# Patient Record
Sex: Female | Born: 1980 | Race: Black or African American | Hispanic: No | Marital: Married | Smoking: Former smoker
Health system: Southern US, Community
[De-identification: ages and names within clinical notes are randomized; demographics above are authoritative.]

## PROBLEM LIST (undated history)

## (undated) DIAGNOSIS — G4730 Sleep apnea, unspecified: Secondary | ICD-10-CM

## (undated) DIAGNOSIS — K219 Gastro-esophageal reflux disease without esophagitis: Secondary | ICD-10-CM

## (undated) DIAGNOSIS — J45909 Unspecified asthma, uncomplicated: Secondary | ICD-10-CM

## (undated) DIAGNOSIS — E119 Type 2 diabetes mellitus without complications: Secondary | ICD-10-CM

## (undated) DIAGNOSIS — I639 Cerebral infarction, unspecified: Secondary | ICD-10-CM

## (undated) DIAGNOSIS — I10 Essential (primary) hypertension: Secondary | ICD-10-CM

## (undated) HISTORY — PX: TUBAL LIGATION: SHX77

## (undated) HISTORY — DX: Cerebral infarction, unspecified: I63.9

---

## 2012-11-17 ENCOUNTER — Emergency Department (HOSPITAL_BASED_OUTPATIENT_CLINIC_OR_DEPARTMENT_OTHER)
Admission: EM | Admit: 2012-11-17 | Discharge: 2012-11-17 | Disposition: A | Discharge status: Home / Self Care | Payer: Medicaid Other | Attending: Emergency Medicine | Admitting: Emergency Medicine

## 2012-11-17 ENCOUNTER — Encounter (HOSPITAL_BASED_OUTPATIENT_CLINIC_OR_DEPARTMENT_OTHER): Payer: Self-pay | Admitting: Emergency Medicine

## 2012-11-17 DIAGNOSIS — F17200 Nicotine dependence, unspecified, uncomplicated: Secondary | ICD-10-CM | POA: Insufficient documentation

## 2012-11-17 DIAGNOSIS — R51 Headache: Secondary | ICD-10-CM | POA: Insufficient documentation

## 2012-11-17 DIAGNOSIS — J45909 Unspecified asthma, uncomplicated: Secondary | ICD-10-CM | POA: Insufficient documentation

## 2012-11-17 DIAGNOSIS — I10 Essential (primary) hypertension: Secondary | ICD-10-CM | POA: Insufficient documentation

## 2012-11-17 DIAGNOSIS — Z79899 Other long term (current) drug therapy: Secondary | ICD-10-CM | POA: Insufficient documentation

## 2012-11-17 HISTORY — DX: Unspecified asthma, uncomplicated: J45.909

## 2012-11-17 HISTORY — DX: Essential (primary) hypertension: I10

## 2012-11-17 MED ORDER — IBUPROFEN 800 MG PO TABS
800.0000 mg | ORAL_TABLET | Freq: Once | ORAL | Status: AC
  Administered 2012-11-17: 800 mg via ORAL
  Filled 2012-11-17: qty 1

## 2012-11-17 MED ORDER — HYDROCHLOROTHIAZIDE 25 MG PO TABS
12.5000 mg | ORAL_TABLET | Freq: Once | ORAL | Status: AC
  Administered 2012-11-17: 12.5 mg via ORAL
  Filled 2012-11-17: qty 1

## 2012-11-17 MED ORDER — LISINOPRIL 10 MG PO TABS
40.0000 mg | ORAL_TABLET | Freq: Once | ORAL | Status: AC
  Administered 2012-11-17: 40 mg via ORAL
  Filled 2012-11-17: qty 4

## 2012-11-17 NOTE — ED Notes (Signed)
MD at bedside. 

## 2012-11-17 NOTE — ED Notes (Signed)
C/o headache since this AM, took tylenol and had some relief but now headache has returned. Pt c/o nausea, denies blurred vision.

## 2012-11-17 NOTE — ED Notes (Signed)
Pt states she is currently out of her BP meds

## 2012-11-17 NOTE — ED Provider Notes (Signed)
CSN: [id number]     Arrival date & time 11/17/12  2108 History  This chart was scribed for Christina [name] B. Bernette Mayers, MD by Karle Plumber, ED Scribe. This patient was seen in room MH02/MH02 and the patient's care was started at 9:30 PM.     Chief Complaint  Patient presents with  . Headache   The history is provided by the patient. No language interpreter was used.   HPI Comments:  Christina [name] is a 32 y.o. female with h/o HTN who presents to the Emergency Department complaining of severe head pain onset approximately 7 hours ago. She reports associated tingling in her face and associated nausea with one episode of emesis. She states she took OTC Tylenol with moderate relief but the pain returned at approximately 7 PM. She states she has experienced this type of headache before when her blood pressure is high. She states she has not taken her blood pressure medication in three days. She reports having preeclampsia with her last pregnancy 5 years ago. She denies any blurred vision.  Past Medical History  Diagnosis Date  . Hypertension   . Asthma    Past Surgical History  Procedure Laterality Date  . Tubal ligation     History reviewed. No pertinent family history. History  Substance Use Topics  . Smoking status: Current Every Day Smoker  . Smokeless tobacco: Not on file  . Alcohol Use: Yes   OB History   Grav Para Term Preterm Abortions TAB SAB Ect Mult Living                 Review of Systems A complete 10 system review of systems was obtained and all systems are negative except as noted in the HPI and PMH.   Allergies  Review of patient's allergies indicates no known allergies.  Home Medications   Current Outpatient Rx  Name  Route  Sig  Dispense  Refill  . albuterol (PROVENTIL HFA;VENTOLIN HFA) 108 (90 BASE) MCG/ACT inhaler   Inhalation   Inhale 2 puffs into the lungs every 6 (six) hours as needed for wheezing.         . hydrochlorothiazide (HYDRODIURIL) 12.5 MG  tablet   Oral   Take 12.5 mg by mouth daily.         Marland Kitchen lisinopril (PRINIVIL,ZESTRIL) 40 MG tablet   Oral   Take 40 mg by mouth daily.         Marland Kitchen loratadine (CLARITIN) 10 MG tablet   Oral   Take 10 mg by mouth daily.         . metoprolol succinate (TOPROL-XL) 100 MG 24 hr tablet   Oral   Take 100 mg by mouth daily. Take with or immediately following a meal.          Triage Vitals: BP 176/109  Pulse 75  Temp(Src) 98.4 F (36.9 C) (Oral)  Resp 18  Ht 5\' 9"  (1.753 m)  Wt 287 lb (130.182 kg)  BMI 42.36 kg/m2  SpO2 100% Physical Exam  Nursing note and vitals reviewed. Constitutional: She is oriented to person, place, and time. She appears well-developed and well-nourished.  HENT:  Head: Normocephalic and atraumatic.  Eyes: EOM are normal. Pupils are equal, round, and reactive to light.  Neck: Normal range of motion. Neck supple.  Cardiovascular: Normal rate, normal heart sounds and intact distal pulses.   Pulmonary/Chest: Effort normal and breath sounds normal.  Abdominal: Bowel sounds are normal. She exhibits no distension. There is no tenderness.  Musculoskeletal: Normal range of motion. She exhibits no edema and no tenderness.  Neurological: She is alert and oriented to person, place, and time. She has normal strength. No cranial nerve deficit or sensory deficit.  Skin: Skin is warm and dry. No rash noted.  Psychiatric: She has a normal mood and affect. Her behavior is normal.    ED Course  Procedures (including critical care time) DIAGNOSTIC STUDIES: Oxygen Saturation is 100% on RA, normal by my interpretation.   COORDINATION OF CARE: 9:35 PM- Will give the pt a dose of hydrochlorothiazide, lisinopril, and metoprolol. Pt verbalizes understanding and agrees to plan.  Labs Review Labs Reviewed - No data to display Imaging Review No results found.  EKG Interpretation   None       MDM  No diagnosis found.  Pt out of HTN meds for several days, will get  them filled tomorrow has headache consistent with previous episodes of HTN. No concern for acute end organ damage/hypertensive emergency. Given an oral dose of her missed home meds. Motrin for headache.   I personally performed the services described in this documentation, which was scribed in my presence. The recorded information has been reviewed and is accurate.        Zayneb Baucum B. Bernette Mayers, MD 11/17/12 2233

## 2012-12-26 ENCOUNTER — Encounter (HOSPITAL_BASED_OUTPATIENT_CLINIC_OR_DEPARTMENT_OTHER): Payer: Self-pay | Admitting: Emergency Medicine

## 2012-12-26 ENCOUNTER — Emergency Department (HOSPITAL_BASED_OUTPATIENT_CLINIC_OR_DEPARTMENT_OTHER)
Admission: EM | Admit: 2012-12-26 | Discharge: 2012-12-26 | Disposition: A | Discharge status: Home / Self Care | Payer: Medicaid Other | Attending: Emergency Medicine | Admitting: Emergency Medicine

## 2012-12-26 DIAGNOSIS — J45909 Unspecified asthma, uncomplicated: Secondary | ICD-10-CM | POA: Insufficient documentation

## 2012-12-26 DIAGNOSIS — R51 Headache: Secondary | ICD-10-CM | POA: Insufficient documentation

## 2012-12-26 DIAGNOSIS — F17200 Nicotine dependence, unspecified, uncomplicated: Secondary | ICD-10-CM | POA: Insufficient documentation

## 2012-12-26 DIAGNOSIS — I10 Essential (primary) hypertension: Secondary | ICD-10-CM | POA: Insufficient documentation

## 2012-12-26 DIAGNOSIS — Z79899 Other long term (current) drug therapy: Secondary | ICD-10-CM | POA: Insufficient documentation

## 2012-12-26 DIAGNOSIS — R209 Unspecified disturbances of skin sensation: Secondary | ICD-10-CM | POA: Insufficient documentation

## 2012-12-26 MED ORDER — LISINOPRIL 20 MG PO TABS: 40.0000 mg | ORAL_TABLET | Freq: Every day | ORAL | Status: DC

## 2012-12-26 MED ORDER — LORATADINE 10 MG PO TABS: 10.0000 mg | ORAL_TABLET | Freq: Every day | ORAL | Status: DC

## 2012-12-26 MED ORDER — HYDROCHLOROTHIAZIDE 12.5 MG PO CAPS: 12.5000 mg | ORAL_CAPSULE | Freq: Every day | ORAL | Status: DC

## 2012-12-26 MED ORDER — METOPROLOL SUCCINATE ER 25 MG PO TB24: 100.0000 mg | ORAL_TABLET | Freq: Every day | ORAL | Status: DC

## 2012-12-26 NOTE — ED Notes (Signed)
Patient here requesting medication refill for BP meds. Patient reports that the pharmacy will no longer refill her meds until she sees MD in [place]. Patient reports that she has missed her BP medication x 2 days. States she gets headaches, has lower extremity swelling, and feels bad without it.

## 2012-12-26 NOTE — ED Provider Notes (Signed)
CSN: [id number]     Arrival date & time 12/26/12  1017 History   First MD Initiated Contact with Patient 12/26/12 1039     Chief Complaint  Patient presents with  . Hypertension   (Consider location/radiation/quality/duration/timing/severity/associated sxs/prior Treatment) HPI Comments: Patient presents for a refill on her blood pressure medications. She states that she normally takes several blood pressure medications and she's been out for about the last 3 days. She says that she normally gets a headache and tingling in her hands when her blood pressure goes up and she is feeling that currently. She says that she has appointment with her primary care physician in [facility] in December but needs medication to last her until that time. She denies any chest pain or shortness of breath. She denies any numbness or weakness in arms or legs other than occasional tingling in her hands. She denies any gait disturbances. She denies any facial numbness or speech deficits.  Patient is a 32 y.o. female presenting with hypertension.  Hypertension Associated symptoms include headaches. Pertinent negatives include no chest pain, no abdominal pain and no shortness of breath.    Past Medical History  Diagnosis Date  . Hypertension   . Asthma    Past Surgical History  Procedure Laterality Date  . Tubal ligation     No family history on file. History  Substance Use Topics  . Smoking status: Current Every Day Smoker  . Smokeless tobacco: Not on file  . Alcohol Use: Yes   OB History   Grav Para Term Preterm Abortions TAB SAB Ect Mult Living                 Review of Systems  Constitutional: Negative for fever, chills, diaphoresis and fatigue.  HENT: Negative for congestion, rhinorrhea and sneezing.   Eyes: Negative.   Respiratory: Negative for cough, chest tightness and shortness of breath.   Cardiovascular: Negative for chest pain and leg swelling.  Gastrointestinal: Negative for nausea,  vomiting, abdominal pain, diarrhea and blood in stool.  Genitourinary: Negative for frequency, hematuria, flank pain and difficulty urinating.  Musculoskeletal: Negative for arthralgias and back pain.  Skin: Negative for rash.  Neurological: Positive for headaches. Negative for dizziness, speech difficulty, weakness and numbness.    Allergies  Review of patient's allergies indicates no known allergies.  Home Medications   Current Outpatient Rx  Name  Route  Sig  Dispense  Refill  . cholecalciferol (VITAMIN D) 1000 UNITS tablet   Oral   Take 1,000 Units by mouth once a week.         Marland Kitchen albuterol (PROVENTIL HFA;VENTOLIN HFA) 108 (90 BASE) MCG/ACT inhaler   Inhalation   Inhale 2 puffs into the lungs every 6 (six) hours as needed for wheezing.         . hydrochlorothiazide (HYDRODIURIL) 12.5 MG tablet   Oral   Take 12.5 mg by mouth daily.         . hydrochlorothiazide (MICROZIDE) 12.5 MG capsule   Oral   Take 1 capsule (12.5 mg total) by mouth daily.   30 capsule   0   . lisinopril (PRINIVIL,ZESTRIL) 20 MG tablet   Oral   Take 2 tablets (40 mg total) by mouth daily.   60 tablet   0   . lisinopril (PRINIVIL,ZESTRIL) 40 MG tablet   Oral   Take 40 mg by mouth daily.         Marland Kitchen loratadine (CLARITIN) 10 MG tablet  Oral   Take 10 mg by mouth daily.         Marland Kitchen loratadine (CLARITIN) 10 MG tablet   Oral   Take 1 tablet (10 mg total) by mouth daily.   30 tablet   0   . metoprolol succinate (TOPROL-XL) 100 MG 24 hr tablet   Oral   Take 100 mg by mouth daily. Take with or immediately following a meal.         . metoprolol succinate (TOPROL-XL) 25 MG 24 hr tablet   Oral   Take 4 tablets (100 mg total) by mouth daily.   120 tablet   0    BP 145/97  Pulse 78  Temp(Src) 98.2 F (36.8 C) (Oral)  Resp 16  Ht 5\' 9"  (1.753 m)  Wt 280 lb (127.007 kg)  BMI 41.33 kg/m2  SpO2 100% Physical Exam  Constitutional: She is oriented to person, place, and time. She  appears well-developed and well-nourished.  HENT:  Head: Normocephalic and atraumatic.  Eyes: Pupils are equal, round, and reactive to light.  Neck: Normal range of motion. Neck supple.  Cardiovascular: Normal rate, regular rhythm and normal heart sounds.   Pulmonary/Chest: Effort normal and breath sounds normal. No respiratory distress. She has no wheezes. She has no rales. She exhibits no tenderness.  Abdominal: Soft. Bowel sounds are normal. There is no tenderness. There is no rebound and no guarding.  Musculoskeletal: Normal range of motion. She exhibits no edema.  Motor 5 out of 5 all extremities. Sensation grossly intact to light touch all extremities. Finger to nose intact. No pronator drift. No facial drooping or aphasia. Gait normal.  Lymphadenopathy:    She has no cervical adenopathy.  Neurological: She is alert and oriented to person, place, and time.  Skin: Skin is warm and dry. No rash noted.  Psychiatric: She has a normal mood and affect.    ED Course  Procedures (including critical care time) Labs Review Labs Reviewed - No data to display Imaging Review No results found.  EKG Interpretation   None       MDM   1. Hypertension    Patient was given a refill on her medications and encouraged to followup with her primary care physician as scheduled in December.    Rolan Bucco, MD 12/26/12 [phone]

## 2013-07-06 ENCOUNTER — Encounter (HOSPITAL_BASED_OUTPATIENT_CLINIC_OR_DEPARTMENT_OTHER): Payer: Self-pay | Admitting: Emergency Medicine

## 2013-07-06 ENCOUNTER — Emergency Department (HOSPITAL_BASED_OUTPATIENT_CLINIC_OR_DEPARTMENT_OTHER)
Admission: EM | Admit: 2013-07-06 | Discharge: 2013-07-06 | Disposition: A | Discharge status: Home / Self Care | Payer: Medicaid Other | Attending: Emergency Medicine | Admitting: Emergency Medicine

## 2013-07-06 DIAGNOSIS — Z79899 Other long term (current) drug therapy: Secondary | ICD-10-CM | POA: Insufficient documentation

## 2013-07-06 DIAGNOSIS — L02411 Cutaneous abscess of right axilla: Secondary | ICD-10-CM

## 2013-07-06 DIAGNOSIS — J45909 Unspecified asthma, uncomplicated: Secondary | ICD-10-CM | POA: Insufficient documentation

## 2013-07-06 DIAGNOSIS — F17200 Nicotine dependence, unspecified, uncomplicated: Secondary | ICD-10-CM | POA: Insufficient documentation

## 2013-07-06 DIAGNOSIS — IMO0002 Reserved for concepts with insufficient information to code with codable children: Secondary | ICD-10-CM | POA: Insufficient documentation

## 2013-07-06 DIAGNOSIS — Z8614 Personal history of Methicillin resistant Staphylococcus aureus infection: Secondary | ICD-10-CM | POA: Insufficient documentation

## 2013-07-06 DIAGNOSIS — I10 Essential (primary) hypertension: Secondary | ICD-10-CM | POA: Insufficient documentation

## 2013-07-06 MED ORDER — PENTAFLUOROPROP-TETRAFLUOROETH EX AERO
INHALATION_SPRAY | CUTANEOUS | Status: AC
  Administered 2013-07-06: 13:00:00
  Filled 2013-07-06: qty 103.5

## 2013-07-06 MED ORDER — SULFAMETHOXAZOLE-TMP DS 800-160 MG PO TABS
1.0000 | ORAL_TABLET | Freq: Once | ORAL | Status: AC
  Administered 2013-07-06: 1 via ORAL
  Filled 2013-07-06: qty 1

## 2013-07-06 MED ORDER — HYDROCODONE-ACETAMINOPHEN 5-325 MG PO TABS: 2.0000 | ORAL_TABLET | Freq: Four times a day (QID) | ORAL | Status: DC | PRN

## 2013-07-06 MED ORDER — SULFAMETHOXAZOLE-TRIMETHOPRIM 800-160 MG PO TABS: 1.0000 | ORAL_TABLET | Freq: Two times a day (BID) | ORAL | Status: DC

## 2013-07-06 NOTE — ED Notes (Signed)
MD at bedside. 

## 2013-07-06 NOTE — ED Notes (Signed)
Pt has an abscess in her right axilla x4 days.

## 2013-07-06 NOTE — ED Provider Notes (Signed)
CSN: [id number]     Arrival date & time 07/06/13  1041 History   First MD Initiated Contact with Patient 07/06/13 1054     Chief Complaint  Patient presents with  . Abscess     (Consider location/radiation/quality/duration/timing/severity/associated sxs/prior Treatment) The history is provided by the patient.  Christina [name] is a 33 y.o. female HTN here with abscess in R axilla. She has frequent axillary abscesses and had them drained before. She is trying to get surgery but she has medicaid so needs pre approval. For the last 4 days, noticed R axillary swelling and pain. No drainage. No fevers. 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    Past Medical History  Diagnosis Date  . Hypertension   . Asthma    Past Surgical History  Procedure Laterality Date  . Tubal ligation     No family history on file. History  Substance Use Topics  . Smoking status: Current Every Day Smoker  . Smokeless tobacco: Not on file  . Alcohol Use: Yes   OB History   Grav Para Term Preterm Abortions TAB SAB Ect Mult Living                 Review of Systems  Skin: Positive for wound.  All other systems reviewed and are negative.     Allergies  Review of patient's allergies indicates no known allergies.  Home Medications   Prior to Admission medications   Medication Sig Start Date End Date Taking? Authorizing Provider  albuterol (PROVENTIL HFA;VENTOLIN HFA) 108 (90 BASE) MCG/ACT inhaler Inhale 2 puffs into the lungs every 6 (six) hours as needed for wheezing.    Historical Provider, MD  cholecalciferol (VITAMIN D) 1000 UNITS tablet Take 1,000 Units by mouth once a week.    Historical Provider, MD  hydrochlorothiazide (HYDRODIURIL) 12.5 MG tablet Take 12.5 mg by mouth daily.    Historical Provider, MD  hydrochlorothiazide (MICROZIDE) 12.5 MG capsule Take 1 capsule (12.5 mg total) by mouth daily. 12/26/12   Rolan Bucco, MD  lisinopril (PRINIVIL,ZESTRIL) 20 MG tablet Take 2 tablets  (40 mg total) by mouth daily. 12/26/12   Rolan Bucco, MD  lisinopril (PRINIVIL,ZESTRIL) 40 MG tablet Take 40 mg by mouth daily.    Historical Provider, MD  loratadine (CLARITIN) 10 MG tablet Take 10 mg by mouth daily.    Historical Provider, MD  loratadine (CLARITIN) 10 MG tablet Take 1 tablet (10 mg total) by mouth daily. 12/26/12   Rolan Bucco, MD  metoprolol succinate (TOPROL-XL) 100 MG 24 hr tablet Take 100 mg by mouth daily. Take with or immediately following a meal.    Historical Provider, MD  metoprolol succinate (TOPROL-XL) 25 MG 24 hr tablet Take 4 tablets (100 mg total) by mouth daily. 12/26/12   Rolan Bucco, MD   BP 131/78  Pulse 76  Temp(Src) 97.9 F (36.6 C) (Oral)  Resp 18  Ht 5\' 9"  (1.753 m)  Wt 285 lb (129.275 kg)  BMI 42.07 kg/m2  SpO2 100%  LMP 07/01/2013 Physical Exam  Nursing note and vitals reviewed. Constitutional: She is oriented to person, place, and time. She appears well-developed.  HENT:  Head: Normocephalic.  Mouth/Throat: Oropharynx is clear and moist.  Eyes: Pupils are equal, round, and reactive to light.  Neck: Normal range of motion. Neck supple.  Cardiovascular: Normal rate.   Pulmonary/Chest: Effort normal.  Abdominal: Soft.  Musculoskeletal: Normal range of motion.  Neurological: She is alert and oriented to person, place,  and time.  Skin: Skin is warm and dry.  R axilla with erythema and fluctuance around 2 in diameter   Psychiatric: She has a normal mood and affect. Her behavior is normal. Judgment and thought content normal.    ED Course  Procedures (including critical care time) INCISION AND DRAINAGE Performed by: Richardean Canalavid H Brant Peets Consent: Verbal consent obtained. Risks and benefits: risks, benefits and alternatives were discussed Type: abscess  Body area: R axilla  Anesthesia: local infiltration and gebauers aerosol  Incision was made with a scalpel.  Local anesthetic: lidocaine 2 % no epinephrine  Anesthetic total: 2  ml  Complexity: complex Blunt dissection to break up loculations  Drainage: purulent  Drainage amount: copious  Packing material: none  Patient tolerance: Patient tolerated the procedure well with no immediate complications.     Labs Review Labs Reviewed - No data to display  Imaging Review No results found.   EKG Interpretation None      MDM   Final diagnoses:  None   Christina [name] is a 33 y.o. female here with R arm abscess. I offered abx and f/u vs I and D since it will likely recur. She wants I and D since it will help relieve the pressure. States that she usually get bactrim so will d/c home with same. She will try and get in to see the surgeon for definitive treatment.      Richardean Canalavid H Thelma Viana, MD 07/06/13 [phone]

## 2013-07-06 NOTE — Discharge Instructions (Signed)
Take bactrim for a week.   Keep wound clean and dry. It may drain so change dressing if it gets soaked.   Follow up with your doctor in a week for wound check.  You should talk to your doctor about getting the surgery.   Return to ER if you have fever, worse swelling.

## 2013-09-12 ENCOUNTER — Encounter (HOSPITAL_BASED_OUTPATIENT_CLINIC_OR_DEPARTMENT_OTHER): Payer: Self-pay | Admitting: Emergency Medicine

## 2013-09-12 ENCOUNTER — Emergency Department (HOSPITAL_BASED_OUTPATIENT_CLINIC_OR_DEPARTMENT_OTHER)
Admission: EM | Admit: 2013-09-12 | Discharge: 2013-09-12 | Disposition: A | Discharge status: Home / Self Care | Payer: Medicaid Other | Attending: Emergency Medicine | Admitting: Emergency Medicine

## 2013-09-12 DIAGNOSIS — Y9389 Activity, other specified: Secondary | ICD-10-CM | POA: Insufficient documentation

## 2013-09-12 DIAGNOSIS — E669 Obesity, unspecified: Secondary | ICD-10-CM | POA: Insufficient documentation

## 2013-09-12 DIAGNOSIS — Z79899 Other long term (current) drug therapy: Secondary | ICD-10-CM | POA: Insufficient documentation

## 2013-09-12 DIAGNOSIS — I10 Essential (primary) hypertension: Secondary | ICD-10-CM | POA: Insufficient documentation

## 2013-09-12 DIAGNOSIS — Z792 Long term (current) use of antibiotics: Secondary | ICD-10-CM | POA: Insufficient documentation

## 2013-09-12 DIAGNOSIS — T628X1A Toxic effect of other specified noxious substances eaten as food, accidental (unintentional), initial encounter: Secondary | ICD-10-CM | POA: Insufficient documentation

## 2013-09-12 DIAGNOSIS — J45909 Unspecified asthma, uncomplicated: Secondary | ICD-10-CM | POA: Insufficient documentation

## 2013-09-12 DIAGNOSIS — Y9229 Other specified public building as the place of occurrence of the external cause: Secondary | ICD-10-CM | POA: Insufficient documentation

## 2013-09-12 DIAGNOSIS — R220 Localized swelling, mass and lump, head: Secondary | ICD-10-CM | POA: Insufficient documentation

## 2013-09-12 DIAGNOSIS — F17200 Nicotine dependence, unspecified, uncomplicated: Secondary | ICD-10-CM | POA: Insufficient documentation

## 2013-09-12 DIAGNOSIS — R221 Localized swelling, mass and lump, neck: Secondary | ICD-10-CM

## 2013-09-12 DIAGNOSIS — T783XXA Angioneurotic edema, initial encounter: Secondary | ICD-10-CM | POA: Insufficient documentation

## 2013-09-12 NOTE — ED Notes (Signed)
Pt states lip swelling since last night and has gotten worse since awakening this am. Pt states had new chinese meal last night and tried Turnip greens for first time last night. Pt is allergic to nuts Denies difficulty swallowing and breathing.

## 2013-09-12 NOTE — ED Notes (Signed)
MD at bedside. 

## 2013-09-12 NOTE — ED Provider Notes (Signed)
CSN: [id number]     Arrival date & time 09/12/13  0714 History   First MD Initiated Contact with Patient 09/12/13 0740   History provided by patient.   Chief Complaint  Patient presents with  . Oral Swelling   HPI  Patient reported that her symptoms of lower lip facial swelling started around 6:00pm last night, swelling initially localized to lower right lip only, quick onset, and gradually spread to entire bottom lip. Denied any involvement of throat, rest of face, eyes, rash, or any respiratory symptoms (no wheezing, cough or shortness of breath). Currently with some improvement, feels lower lip swelling is improving, and admits to no new worsening symptoms. Admitted to new food exposure last night, [place] food with (chicken, beef, shrimp, and pork, fried rice vegetables, and turnip greens), she has eaten at this same restaurant before without problems but never this particular order.  Significant past medical hx of prior allergic reactions / episodes of angioedema before, first diagnosed with peanut allergy in 2010 with angioedema and throat swelling. Did not require to be intubated in past. Has been on Lisinopril since 2010, currently compliant with 40mg  daily therapy.  Denies significant family history of angioedema or swelling.  Past Medical History  Diagnosis Date  . Hypertension   . Asthma    Past Surgical History  Procedure Laterality Date  . Tubal ligation     No family history on file. History  Substance Use Topics  . Smoking status: Current Every Day Smoker  . Smokeless tobacco: Not on file  . Alcohol Use: Yes     Comment: occ   OB History   Grav Para Term Preterm Abortions TAB SAB Ect Mult Living                 Review of Systems  See above HPI  Allergies  Peanut-containing drug products and Tomato  Home Medications   Prior to Admission medications   Medication Sig Start Date End Date Taking? Authorizing Provider  albuterol (PROVENTIL HFA;VENTOLIN HFA)  108 (90 BASE) MCG/ACT inhaler Inhale 2 puffs into the lungs every 6 (six) hours as needed for wheezing.    Historical Provider, MD  cholecalciferol (VITAMIN D) 1000 UNITS tablet Take 1,000 Units by mouth once a week.    Historical Provider, MD  hydrochlorothiazide (HYDRODIURIL) 12.5 MG tablet Take 12.5 mg by mouth daily.    Historical Provider, MD  hydrochlorothiazide (MICROZIDE) 12.5 MG capsule Take 1 capsule (12.5 mg total) by mouth daily. 12/26/12   Rolan Bucco, MD  HYDROcodone-acetaminophen (NORCO/VICODIN) 5-325 MG per tablet Take 2 tablets by mouth every 6 (six) hours as needed. 07/06/13   Richardean Canal, MD  lisinopril (PRINIVIL,ZESTRIL) 20 MG tablet Take 2 tablets (40 mg total) by mouth daily. 12/26/12   Rolan Bucco, MD  lisinopril (PRINIVIL,ZESTRIL) 40 MG tablet Take 40 mg by mouth daily.    Historical Provider, MD  loratadine (CLARITIN) 10 MG tablet Take 10 mg by mouth daily.    Historical Provider, MD  loratadine (CLARITIN) 10 MG tablet Take 1 tablet (10 mg total) by mouth daily. 12/26/12   Rolan Bucco, MD  metoprolol succinate (TOPROL-XL) 100 MG 24 hr tablet Take 100 mg by mouth daily. Take with or immediately following a meal.    Historical Provider, MD  metoprolol succinate (TOPROL-XL) 25 MG 24 hr tablet Take 4 tablets (100 mg total) by mouth daily. 12/26/12   Rolan Bucco, MD  sulfamethoxazole-trimethoprim (SEPTRA DS) 800-160 MG per tablet Take 1 tablet by  mouth 2 (two) times daily. 07/06/13   Richardean Canalavid H Yao, MD   BP 150/101  Pulse 78  Temp(Src) 98.8 F (37.1 C) (Oral)  Resp 16  SpO2 100%  LMP 08/12/2013 Physical Exam  Gen - obese, well-appearing, cooperative, NAD HEENT - NCAT, PERRL, EOMI, patent nares w/o congestion, oropharynx clear without erythema or edema, significant lower lip edema noted, MMM Neck - supple, non-tender Heart - RRR, no murmurs heard Lungs - CTAB, no wheezing, crackles, or rhonchi. Normal work of breathing. Abd - soft, NTND, +active BS Ext - non-tender, no  edema, peripheral pulses intact +2 b/l Skin - warm, dry, no rashes or urticaria Neuro - awake, alert, oriented, grossly non-focal  ED Course  Procedures (including critical care time) Labs Review Labs Reviewed - No data to display  Imaging Review No results found.   EKG Interpretation None      MDM   Final diagnoses:  Angioedema of lips, initial encounter   33 yr F with hx HTN, asthma, active smoker, known hx peanut allergy (with prior similar reactions), presents for acute onset lower lip facial swelling started last night @ 1800, currently improved, no throat swelling or respiratory complaints, exam reassuring without airway edema and lungs clear, stable vitals. Suspected acute angioedema, in setting of ACEi (on Lisinopril 40mg  daily) despite being on therapy for > 4 years, still at risk. Did not take today. Possible allergic reaction to potential new food exposure last night, however no systemic findings on exam to support this.  Recommend to discontinue ACEi currently and continue to hold. Start Prednisone brief burst 60mg  x 3 days, continue Claritin daily, may take Benadryl OTC PRN. Close follow-up with PCP to discuss likely angioedema due to ACEi, consider trial on ARB, will not start at this time. Discharge to home with reassurance and return precautions (if worsening edema, involving throat, SOB/CP, wheezing or any respiratory compromise, advised to return to ED or seek immediate medical attention.)    Saralyn PilarAlexander Jumanah Hynson, DO 09/12/13 [phone]

## 2013-09-12 NOTE — ED Provider Notes (Signed)
I saw and evaluated the patient, reviewed the resident's note and I agree with the findings and plan.   EKG Interpretation None      MIld edema lower lip; tongue appears normal; no stridor no drooling no dysphonia; lungs clear and unlabored breathing; pulse oximetry normal on room air at 100%; no rash noted  Hurman HornJohn M Puneet Masoner, MD 09/20/13 1506

## 2013-09-12 NOTE — Discharge Instructions (Signed)
You were evaluated for lower lip swelling. We think that this is caused by Angioedema (swelling reaction, commonly on the face). In your case, this is most likely due to your medication - Lisinopril (class of meds called an ACE inhibitor - commonly can cause this). There is a chance this is hereditary too if you have other family members with similar problem. STOP taking Lisinopril today Schedule close follow-up with your regular doctor to discuss discontinuing Lisinopril (and avoiding ACE inhibitors in future) - They may switch you to a different blood pressure medication.  If you develop worsening swelling, throat swelling, or any breathing difficulties, shortness of breath, chest pain, or wheezing, please call your doctor and seek immediate medical attention.   Angioedema Angioedema is sudden puffiness (swelling), often of the skin. It can happen:  On your face or privates (genitals).  In your belly (abdomen) or other body parts. It usually happens quickly and gets better in 1 or 2 days. It often starts at night and is found when you wake up. You may get red, itchy patches of skin (hives). Attacks can be dangerous if your breathing passages get puffy. The condition may happen only once, or it can come back at random times. It may happen for several years before it goes away for good. HOME CARE  Only take medicines as told by your doctor.  Always carry your emergency allergy medicines with you.  Wear a medical bracelet as told by your doctor.  Avoid things that you know will cause attacks (triggers). GET HELP IF:  You have another attack.  Your attacks happen more often or get worse.  The condition was passed to you by your parents and you want to have children. GET HELP RIGHT AWAY IF:   Your mouth, tongue, or lips are very puffy.  You have trouble breathing.  You have trouble swallowing.  You pass out (faint). MAKE SURE YOU:   Understand these instructions.  Will watch  your condition.  Will get help right away if you are not doing well or get worse. Document Released: 01/08/2009 Document Revised: 11/10/2012 Document Reviewed: 09/13/2012 [facility] Patient Information 2015 [place], [place]. This information is not intended to replace advice given to you by your health care provider. Make sure you discuss any questions you have with your health care provider.

## 2015-03-28 DIAGNOSIS — J45909 Unspecified asthma, uncomplicated: Secondary | ICD-10-CM | POA: Insufficient documentation

## 2015-10-22 ENCOUNTER — Emergency Department (HOSPITAL_BASED_OUTPATIENT_CLINIC_OR_DEPARTMENT_OTHER)
Admission: EM | Admit: 2015-10-22 | Discharge: 2015-10-22 | Disposition: A | Discharge status: Home / Self Care | Payer: BLUE CROSS/BLUE SHIELD | Attending: Emergency Medicine | Admitting: Emergency Medicine

## 2015-10-22 ENCOUNTER — Encounter (HOSPITAL_BASED_OUTPATIENT_CLINIC_OR_DEPARTMENT_OTHER): Payer: Self-pay | Admitting: Emergency Medicine

## 2015-10-22 ENCOUNTER — Emergency Department (HOSPITAL_BASED_OUTPATIENT_CLINIC_OR_DEPARTMENT_OTHER): Payer: BLUE CROSS/BLUE SHIELD

## 2015-10-22 DIAGNOSIS — F17200 Nicotine dependence, unspecified, uncomplicated: Secondary | ICD-10-CM | POA: Diagnosis not present

## 2015-10-22 DIAGNOSIS — J45901 Unspecified asthma with (acute) exacerbation: Secondary | ICD-10-CM | POA: Insufficient documentation

## 2015-10-22 DIAGNOSIS — J069 Acute upper respiratory infection, unspecified: Secondary | ICD-10-CM | POA: Diagnosis not present

## 2015-10-22 DIAGNOSIS — I10 Essential (primary) hypertension: Secondary | ICD-10-CM | POA: Insufficient documentation

## 2015-10-22 DIAGNOSIS — Z79899 Other long term (current) drug therapy: Secondary | ICD-10-CM | POA: Insufficient documentation

## 2015-10-22 DIAGNOSIS — R0602 Shortness of breath: Secondary | ICD-10-CM | POA: Diagnosis present

## 2015-10-22 IMAGING — DX DG CHEST 2V
2 series · 2 of 2 positions shown · non-contrast
Comparison: None.

CLINICAL DATA: Cough, congestion, shortness of Breath

EXAM:
CHEST  2 VIEW

[chest pa]
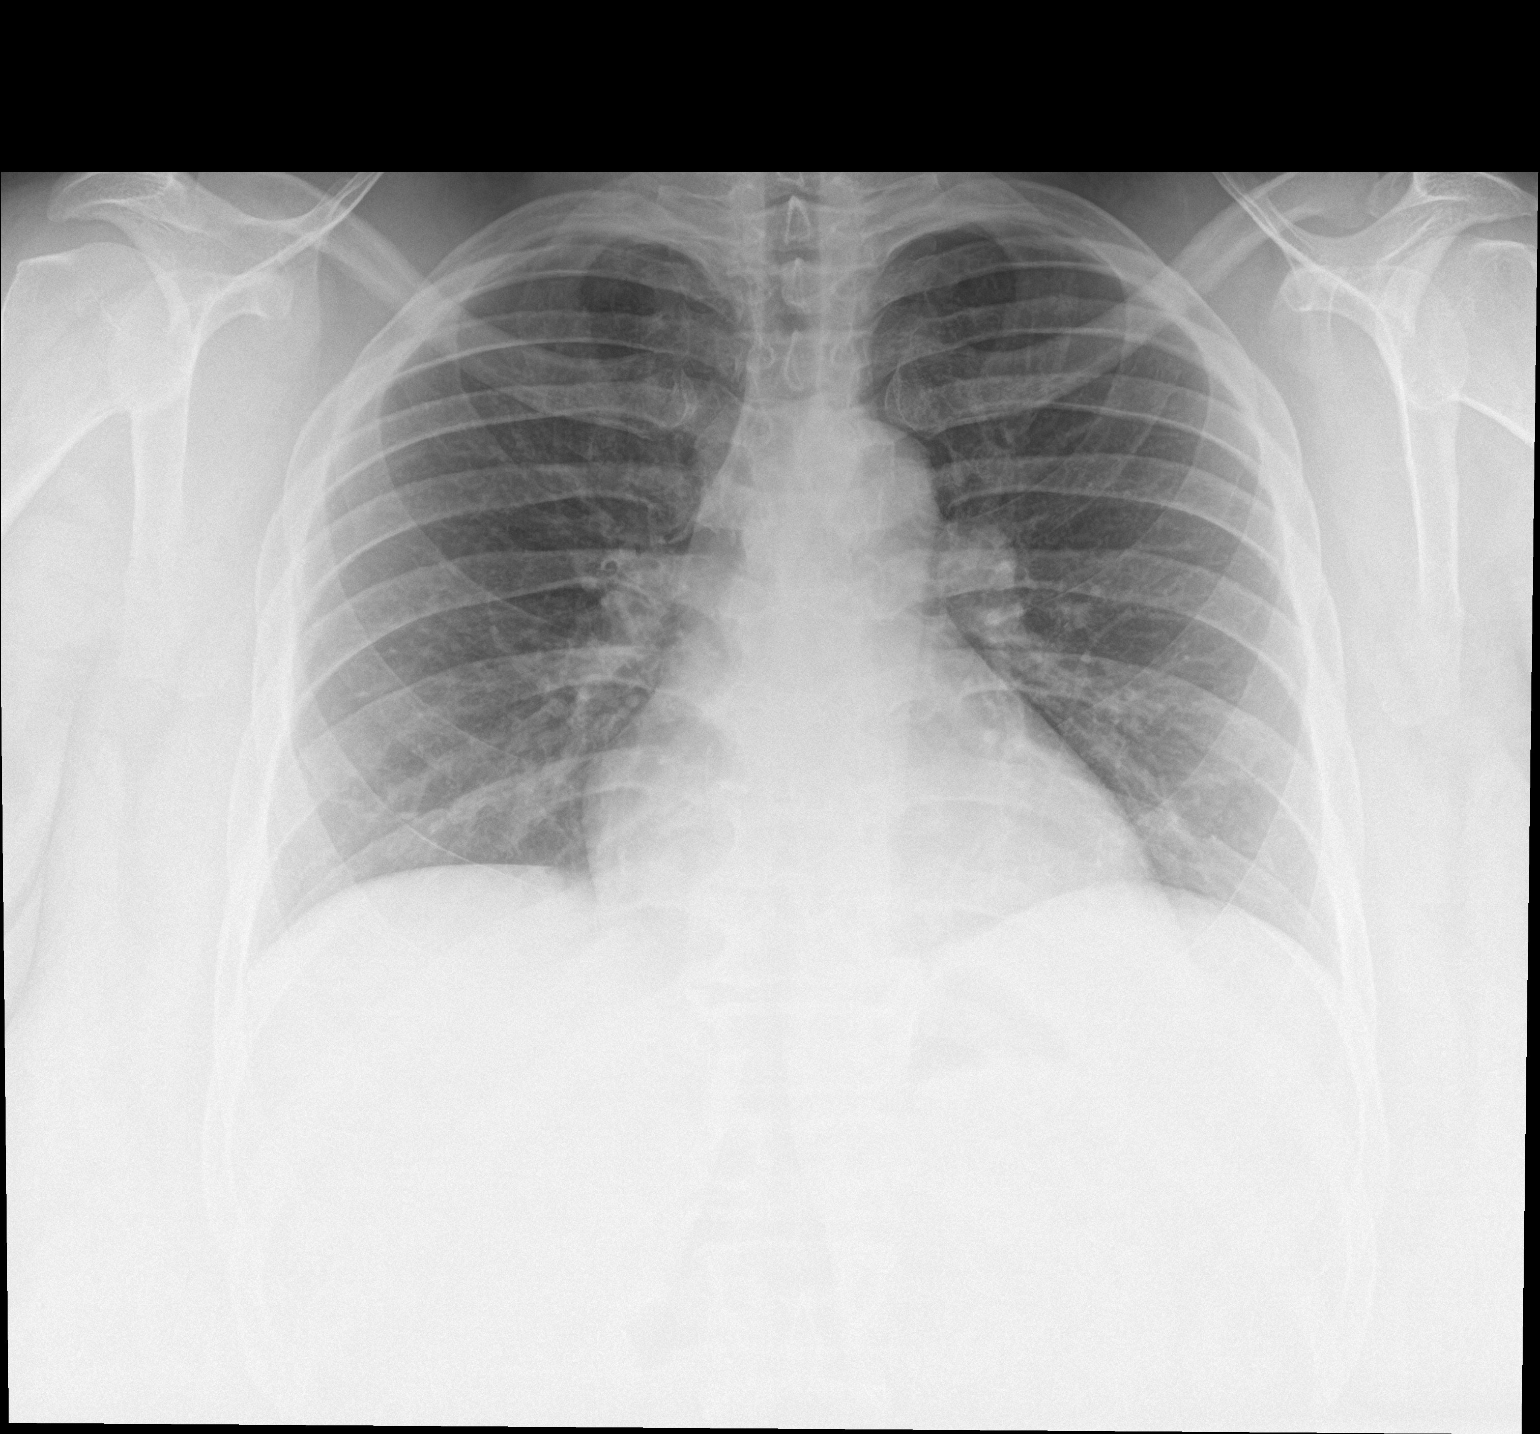

[chest lat]
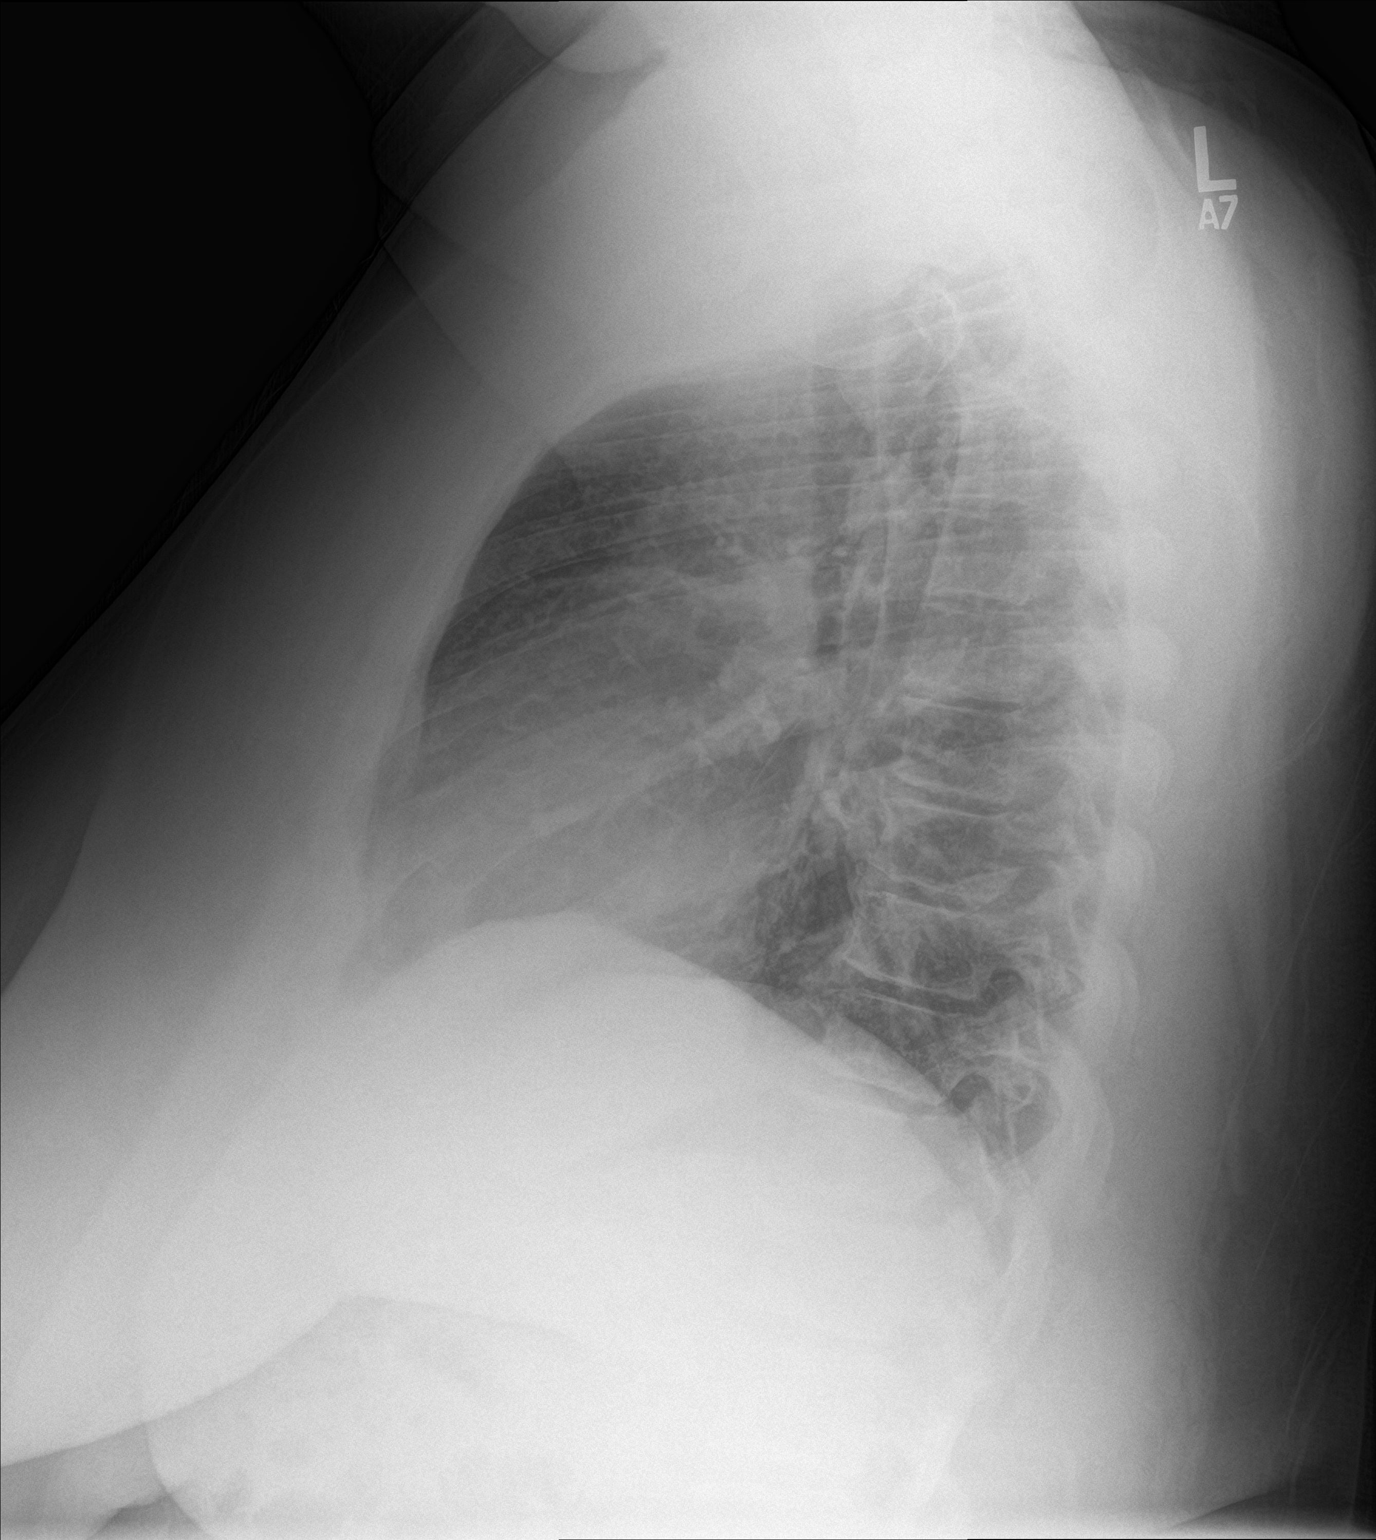

[2 of 2 positions shown; findings below may reference images not displayed]

FINDINGS: Heart and mediastinal contours are within normal limits. No focal
opacities or effusions. No acute bony abnormality.
IMPRESSION: No active cardiopulmonary disease.

## 2015-10-22 MED ORDER — IPRATROPIUM-ALBUTEROL 0.5-2.5 (3) MG/3ML IN SOLN
3.0000 mL | Freq: Once | RESPIRATORY_TRACT | Status: AC
  Administered 2015-10-22: 3 mL via RESPIRATORY_TRACT
  Filled 2015-10-22: qty 3

## 2015-10-22 MED ORDER — ALBUTEROL SULFATE (2.5 MG/3ML) 0.083% IN NEBU
2.5000 mg | INHALATION_SOLUTION | Freq: Once | RESPIRATORY_TRACT | Status: AC
  Administered 2015-10-22: 2.5 mg via RESPIRATORY_TRACT
  Filled 2015-10-22: qty 3

## 2015-10-22 MED ORDER — PREDNISONE 50 MG PO TABS
60.0000 mg | ORAL_TABLET | Freq: Once | ORAL | Status: AC
  Administered 2015-10-22: 60 mg via ORAL
  Filled 2015-10-22: qty 1

## 2015-10-22 MED ORDER — PREDNISONE 10 MG PO TABS: 60.0000 mg | ORAL_TABLET | Freq: Every day | ORAL | 0 refills | Status: DC

## 2015-10-22 MED ORDER — FLUTICASONE PROPIONATE 50 MCG/ACT NA SUSP
1.0000 | Freq: Every day | NASAL | 0 refills | Status: DC
Start: 2015-10-22 — End: 2017-08-02

## 2015-10-22 NOTE — ED Triage Notes (Signed)
Patient reports chest congestion, nasal congestion and SOB that is not relieved with Nebs or Inhaler

## 2015-10-22 NOTE — ED Provider Notes (Signed)
[facility] Provider Note   CSN: [id number] Arrival date & time: 10/22/15  1544 By signing my name below, I, [name], attest that this documentation has been prepared under the direction and in the presence of Audry Pili, PA-C. Electronically Signed: [name], ED Scribe. 10/22/15. 4:33 PM.  History   Chief Complaint Chief Complaint  Patient presents with  . Shortness of Breath   HPI Comments: Christina [name] is a 35 y.o. female with h/o asthma and HTN who presents to the Emergency Department complaining of intermittent shortness of breath onset 2 days ago. Pt also has associated chest tightness, cough, and nasal congestion. She notes she has a sore throat secondary to her cough. Pt states her symptoms are exacerbated with ambulating. Pt has tried Mucinex, her inhaler, and a nebulizer treatment with no relief to her symptoms. Pt denies abdominal pain, fever, nausea, vomiting, diarrhea, or any other associated symptoms.    The history is provided by the patient. No language interpreter was used.    Past Medical History:  Diagnosis Date  . Asthma   . Hypertension     There are no active problems to display for this patient.   Past Surgical History:  Procedure Laterality Date  . TUBAL LIGATION      OB History    No data available       Home Medications    Prior to Admission medications   Medication Sig Start Date End Date Taking? Authorizing Provider  albuterol (PROVENTIL HFA;VENTOLIN HFA) 108 (90 BASE) MCG/ACT inhaler Inhale 2 puffs into the lungs every 6 (six) hours as needed for wheezing.    Historical Provider, MD  cholecalciferol (VITAMIN D) 1000 UNITS tablet Take 1,000 Units by mouth once a week.    Historical Provider, MD  hydrochlorothiazide (HYDRODIURIL) 12.5 MG tablet Take 12.5 mg by mouth daily.    Historical Provider, MD  hydrochlorothiazide (MICROZIDE) 12.5 MG capsule Take 1 capsule (12.5 mg total) by mouth daily. 12/26/12   Rolan Bucco, MD    HYDROcodone-acetaminophen (NORCO/VICODIN) 5-325 MG per tablet Take 2 tablets by mouth every 6 (six) hours as needed. 07/06/13   Charlynne Pander, MD  lisinopril (PRINIVIL,ZESTRIL) 20 MG tablet Take 2 tablets (40 mg total) by mouth daily. 12/26/12   Rolan Bucco, MD  lisinopril (PRINIVIL,ZESTRIL) 40 MG tablet Take 40 mg by mouth daily.    Historical Provider, MD  loratadine (CLARITIN) 10 MG tablet Take 10 mg by mouth daily.    Historical Provider, MD  loratadine (CLARITIN) 10 MG tablet Take 1 tablet (10 mg total) by mouth daily. 12/26/12   Rolan Bucco, MD  metoprolol succinate (TOPROL-XL) 100 MG 24 hr tablet Take 100 mg by mouth daily. Take with or immediately following a meal.    Historical Provider, MD  metoprolol succinate (TOPROL-XL) 25 MG 24 hr tablet Take 4 tablets (100 mg total) by mouth daily. 12/26/12   Rolan Bucco, MD  sulfamethoxazole-trimethoprim (SEPTRA DS) 800-160 MG per tablet Take 1 tablet by mouth 2 (two) times daily. 07/06/13   Charlynne Pander, MD    Family History History reviewed. No pertinent family history.  Social History Social History  Substance Use Topics  . Smoking status: Current Every Day Smoker  . Smokeless tobacco: Never Used  . Alcohol use Yes     Comment: occ     Allergies   Peanut-containing drug products and Tomato   Review of Systems Review of Systems 10 Systems reviewed and all are negative for acute  change except as noted in the HPI. Physical Exam Updated Vital Signs BP 150/99 (BP Location: Right Arm)   Pulse 94   Temp 98.1 F (36.7 C) (Oral)   Resp 18   Ht 5\' 7"  (1.702 m)   Wt (!) 310 lb (140.6 kg)   LMP 10/22/2015   SpO2 95%   BMI 48.55 kg/m   Physical Exam  Constitutional: She appears well-developed and well-nourished.  HENT:  Head: Normocephalic and atraumatic.  Eyes: Conjunctivae are normal.  Cardiovascular: Normal rate, regular rhythm and normal heart sounds.  Exam reveals no gallop and no friction rub.   No murmur  heard. Pulmonary/Chest: Effort normal. No respiratory distress. She has wheezes in the right upper field and the right lower field. She has no rales.  Abdominal: She exhibits no distension.  Musculoskeletal: Normal range of motion.  Neurological: She is alert.  Skin: Skin is warm and dry.  Psychiatric: She has a normal mood and affect. Her behavior is normal.  Nursing note and vitals reviewed.  ED Treatments / Results  DIAGNOSTIC STUDIES: Oxygen Saturation is 95% on RA, adequate by my interpretation.    COORDINATION OF CARE: 4:30 PM Discussed treatment plan with pt at bedside which includes CXR and breathing treatment and pt agreed to plan.  Labs (all labs ordered are listed, but only abnormal results are displayed) Labs Reviewed - No data to display  EKG  EKG Interpretation None       Radiology No results found.  Procedures Procedures (including critical care time)  Medications Ordered in ED Medications - No data to display   Initial Impression / Assessment and Plan / ED Course  I have reviewed the triage vital signs and the nursing notes.  Pertinent labs & imaging results that were available during my care of the patient were reviewed by me and considered in my medical decision making (see chart for details).  Clinical Course   Final Clinical Impressions(s) / ED Diagnoses  I have reviewed and evaluated the relevant imaging studies. I have reviewed the relevant previous healthcare records. I obtained HPI from historian.  ED Course:  Assessment: Pt is a 35yF presents with URI sympoms x 2 days with asthma exacerbation . On exam, pt in NAD. VSS. Afebrile. Lungs wheeze on right side. Heart RRR. Pt CXR negative for acute infiltrate. Patients symptoms are consistent with URI, likely viral etiology. Discussed that antibiotics are not indicated for viral infections. Pt will be discharged with symptomatic treatment.  Verbalizes understanding and is agreeable with plan. Pt is  hemodynamically stable & in NAD prior to dc.  Disposition/Plan:  DC Home Additional Verbal discharge instructions given and discussed with patient.  Pt Instructed to f/u with PCP in the next week for evaluation and treatment of symptoms. Return precautions given Pt acknowledges and agrees with plan  Supervising Physician Lavera Guiseana Duo Liu, MD   Final diagnoses:  URI (upper respiratory infection)  Asthma exacerbation   I personally performed the services described in this documentation, which was scribed in my presence. The recorded information has been reviewed and is accurate.   New Prescriptions New Prescriptions   No medications on file     Audry Piliyler Rodnisha Blomgren, PA-C 10/22/15 1756    Lavera Guiseana Duo Liu, MD 10/23/15 1229

## 2015-10-22 NOTE — Discharge Instructions (Signed)
Please read and follow all provided instructions.  Your diagnoses today include:  1. URI (upper respiratory infection)   2. Asthma exacerbation     You appear to have an upper respiratory infection (URI). An upper respiratory tract infection, or cold, is a viral infection of the air passages leading to the lungs. It should improve gradually after 5-7 days. You may have a lingering cough that lasts for 2- 4 weeks after the infection.  Tests performed today include: Vital signs. See below for your results today.   Medications prescribed:   Take any prescribed medications only as directed. Treatment for your infection is aimed at treating the symptoms. There are no medications, such as antibiotics, that will cure your infection.   Home care instructions:  Follow any educational materials contained in this packet.   Your illness is contagious and can be spread to others, especially during the first 3 or 4 days. It cannot be cured by antibiotics or other medicines. Take basic precautions such as washing your hands often, covering your mouth when you cough or sneeze, and avoiding public places where you could spread your illness to others.   Please continue drinking plenty of fluids.  Use over-the-counter medicines as needed as directed on packaging for symptom relief.  You may also use ibuprofen or tylenol as directed on packaging for pain or fever.  Do not take multiple medicines containing Tylenol or acetaminophen to avoid taking too much of this medication.  Follow-up instructions: Please follow-up with your primary care provider in the next 3 days for further evaluation of your symptoms if you are not feeling better.   Return instructions:  Please return to the Emergency Department if you experience worsening symptoms.  RETURN IMMEDIATELY IF you develop shortness of breath, confusion or altered mental status, a new rash, become dizzy, faint, or poorly responsive, or are unable to be cared  for at home. Please return if you have persistent vomiting and cannot keep down fluids or develop a fever that is not controlled by tylenol or motrin.   Please return if you have any other emergent concerns.  Additional Information:  Your vital signs today were: BP 150/99 (BP Location: Right Arm)    Pulse 94    Temp 98.1 F (36.7 C) (Oral)    Resp 18    Ht 5\' 7"  (1.702 m)    Wt (!) 140.6 kg    LMP 10/22/2015    SpO2 97%    BMI 48.55 kg/m  If your blood pressure (BP) was elevated above 135/85 this visit, please have this repeated by your doctor within one month. --------------

## 2017-07-26 ENCOUNTER — Emergency Department (HOSPITAL_BASED_OUTPATIENT_CLINIC_OR_DEPARTMENT_OTHER): Payer: BLUE CROSS/BLUE SHIELD

## 2017-07-26 ENCOUNTER — Emergency Department (HOSPITAL_BASED_OUTPATIENT_CLINIC_OR_DEPARTMENT_OTHER)
Admission: EM | Admit: 2017-07-26 | Discharge: 2017-07-26 | Disposition: A | Discharge status: Home / Self Care | Payer: BLUE CROSS/BLUE SHIELD | Attending: Emergency Medicine | Admitting: Emergency Medicine

## 2017-07-26 ENCOUNTER — Encounter (HOSPITAL_BASED_OUTPATIENT_CLINIC_OR_DEPARTMENT_OTHER): Payer: Self-pay | Admitting: Emergency Medicine

## 2017-07-26 ENCOUNTER — Other Ambulatory Visit: Payer: Self-pay

## 2017-07-26 DIAGNOSIS — Y9389 Activity, other specified: Secondary | ICD-10-CM | POA: Diagnosis not present

## 2017-07-26 DIAGNOSIS — Z91010 Allergy to peanuts: Secondary | ICD-10-CM | POA: Diagnosis not present

## 2017-07-26 DIAGNOSIS — S6992XA Unspecified injury of left wrist, hand and finger(s), initial encounter: Secondary | ICD-10-CM | POA: Diagnosis present

## 2017-07-26 DIAGNOSIS — Y92410 Unspecified street and highway as the place of occurrence of the external cause: Secondary | ICD-10-CM | POA: Diagnosis not present

## 2017-07-26 DIAGNOSIS — Z79899 Other long term (current) drug therapy: Secondary | ICD-10-CM | POA: Insufficient documentation

## 2017-07-26 DIAGNOSIS — J45909 Unspecified asthma, uncomplicated: Secondary | ICD-10-CM | POA: Insufficient documentation

## 2017-07-26 DIAGNOSIS — F17200 Nicotine dependence, unspecified, uncomplicated: Secondary | ICD-10-CM | POA: Insufficient documentation

## 2017-07-26 DIAGNOSIS — Y999 Unspecified external cause status: Secondary | ICD-10-CM | POA: Insufficient documentation

## 2017-07-26 DIAGNOSIS — S63602A Unspecified sprain of left thumb, initial encounter: Secondary | ICD-10-CM | POA: Diagnosis not present

## 2017-07-26 DIAGNOSIS — I10 Essential (primary) hypertension: Secondary | ICD-10-CM | POA: Diagnosis not present
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
## 2017-07-26 NOTE — Discharge Instructions (Addendum)
These can be hard to see on the initial x-rays.  If you develop worsening pain or if you develop numbness or other concerning signs or symptoms then return to the ER or see her primary care doctor.

## 2017-08-02 ENCOUNTER — Emergency Department (HOSPITAL_BASED_OUTPATIENT_CLINIC_OR_DEPARTMENT_OTHER): Payer: BLUE CROSS/BLUE SHIELD

## 2017-08-02 ENCOUNTER — Other Ambulatory Visit: Payer: Self-pay

## 2017-08-02 ENCOUNTER — Emergency Department (HOSPITAL_BASED_OUTPATIENT_CLINIC_OR_DEPARTMENT_OTHER)
Admission: EM | Admit: 2017-08-02 | Discharge: 2017-08-02 | Disposition: A | Discharge status: Home / Self Care | Payer: BLUE CROSS/BLUE SHIELD | Attending: Emergency Medicine | Admitting: Emergency Medicine

## 2017-08-02 ENCOUNTER — Encounter (HOSPITAL_BASED_OUTPATIENT_CLINIC_OR_DEPARTMENT_OTHER): Payer: Self-pay | Admitting: Emergency Medicine

## 2017-08-02 DIAGNOSIS — I10 Essential (primary) hypertension: Secondary | ICD-10-CM | POA: Diagnosis not present

## 2017-08-02 DIAGNOSIS — B9789 Other viral agents as the cause of diseases classified elsewhere: Secondary | ICD-10-CM | POA: Diagnosis not present

## 2017-08-02 DIAGNOSIS — R0789 Other chest pain: Secondary | ICD-10-CM | POA: Insufficient documentation

## 2017-08-02 DIAGNOSIS — F17200 Nicotine dependence, unspecified, uncomplicated: Secondary | ICD-10-CM | POA: Diagnosis not present

## 2017-08-02 DIAGNOSIS — Z79899 Other long term (current) drug therapy: Secondary | ICD-10-CM | POA: Diagnosis not present

## 2017-08-02 DIAGNOSIS — J029 Acute pharyngitis, unspecified: Secondary | ICD-10-CM

## 2017-08-02 DIAGNOSIS — J069 Acute upper respiratory infection, unspecified: Secondary | ICD-10-CM

## 2017-08-02 DIAGNOSIS — R05 Cough: Secondary | ICD-10-CM | POA: Diagnosis present

## 2017-08-02 DIAGNOSIS — J45909 Unspecified asthma, uncomplicated: Secondary | ICD-10-CM | POA: Diagnosis not present

## 2017-08-02 LAB — RAPID STREP SCREEN (MED CTR MEBANE ONLY): Streptococcus, Group A Screen (Direct): NEGATIVE

## 2017-08-02 IMAGING — DX DG CHEST 2V
2 series · 2 of 2 positions shown · non-contrast
Comparison: [FI]

CLINICAL DATA: 36 year-old female c/o productive cough w/ green
sputum and SOB x 4 days. Pt denies fever. Hx of asthma, HTN, Smoker
x < 1ppd

EXAM:
CHEST - 2 VIEW

[chest pa]
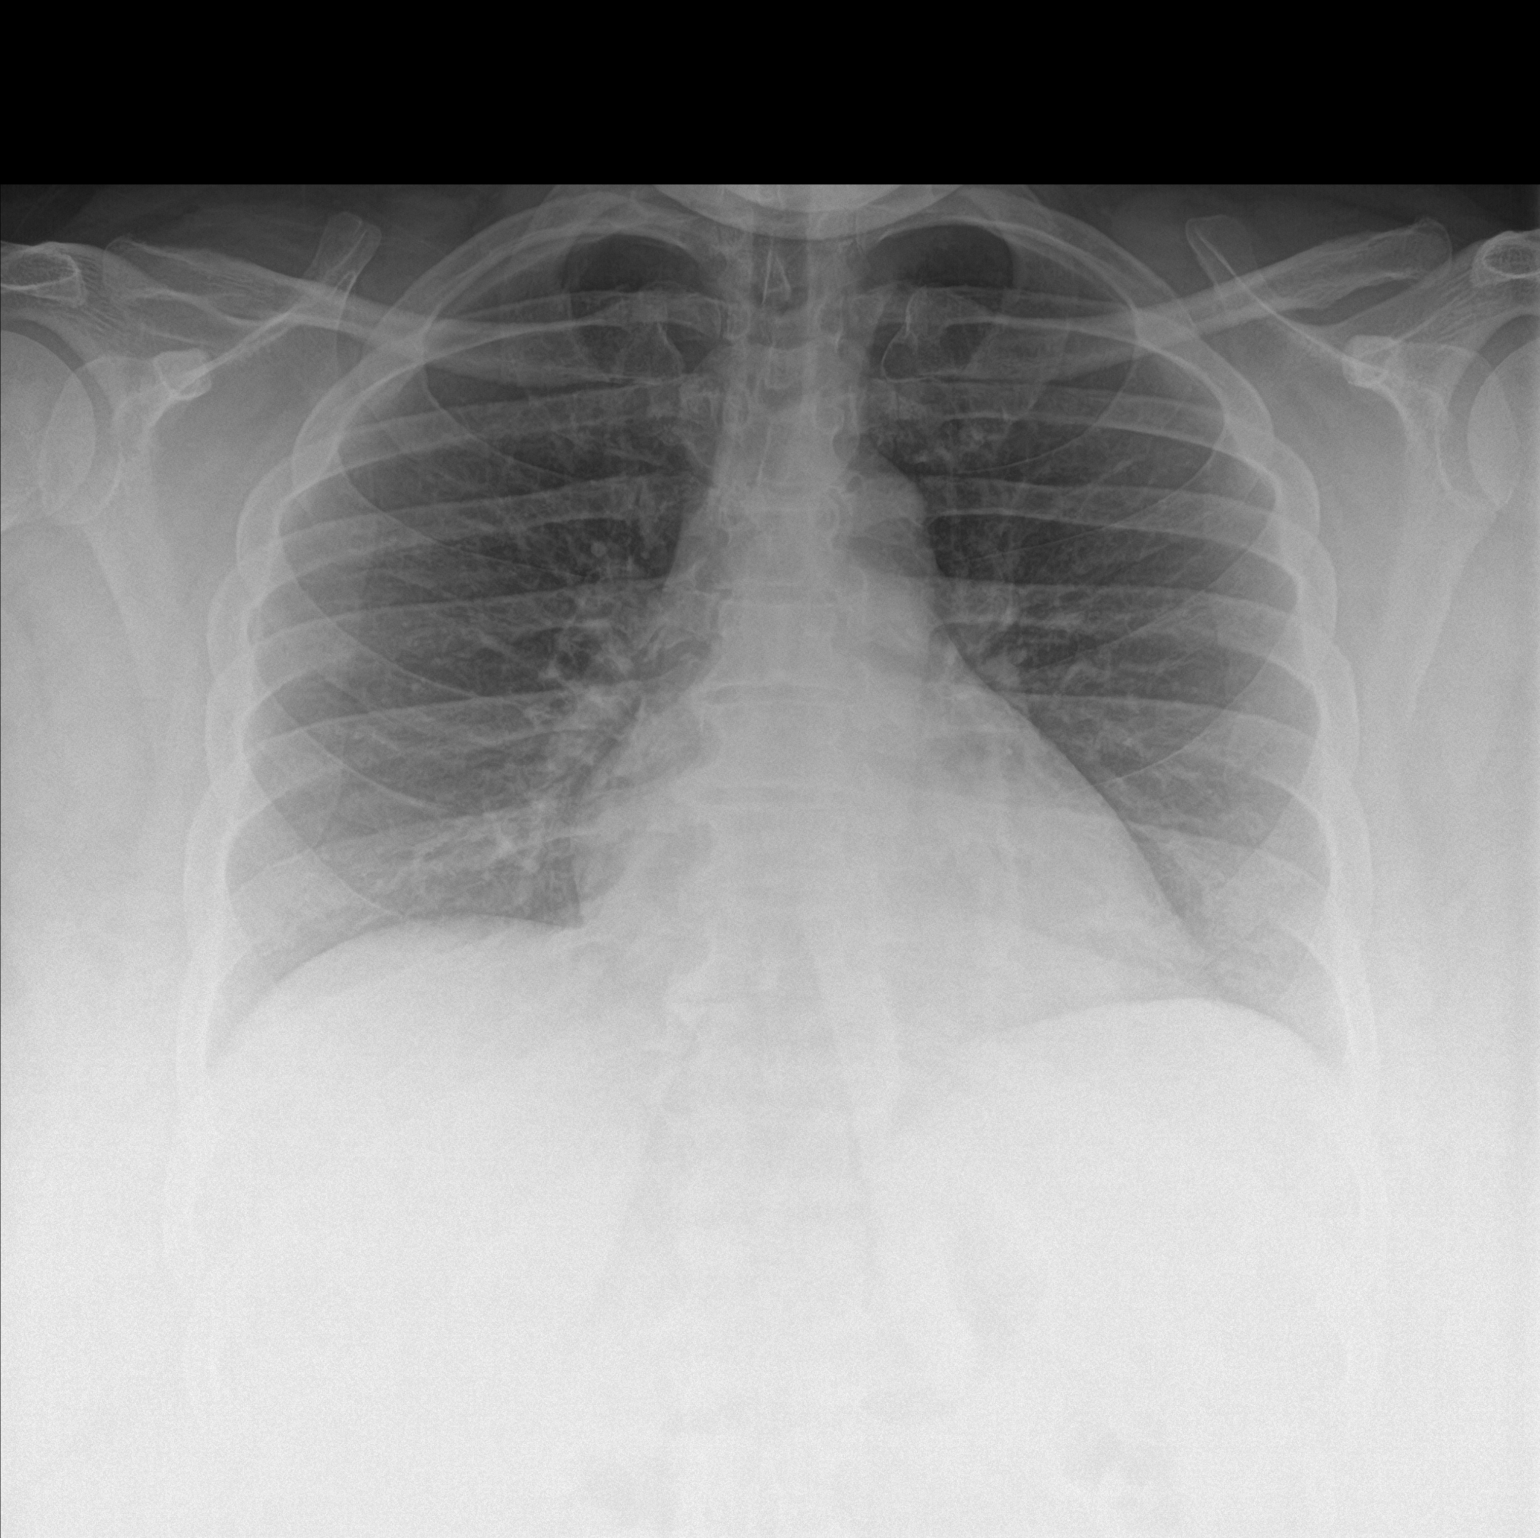

[chest lat]
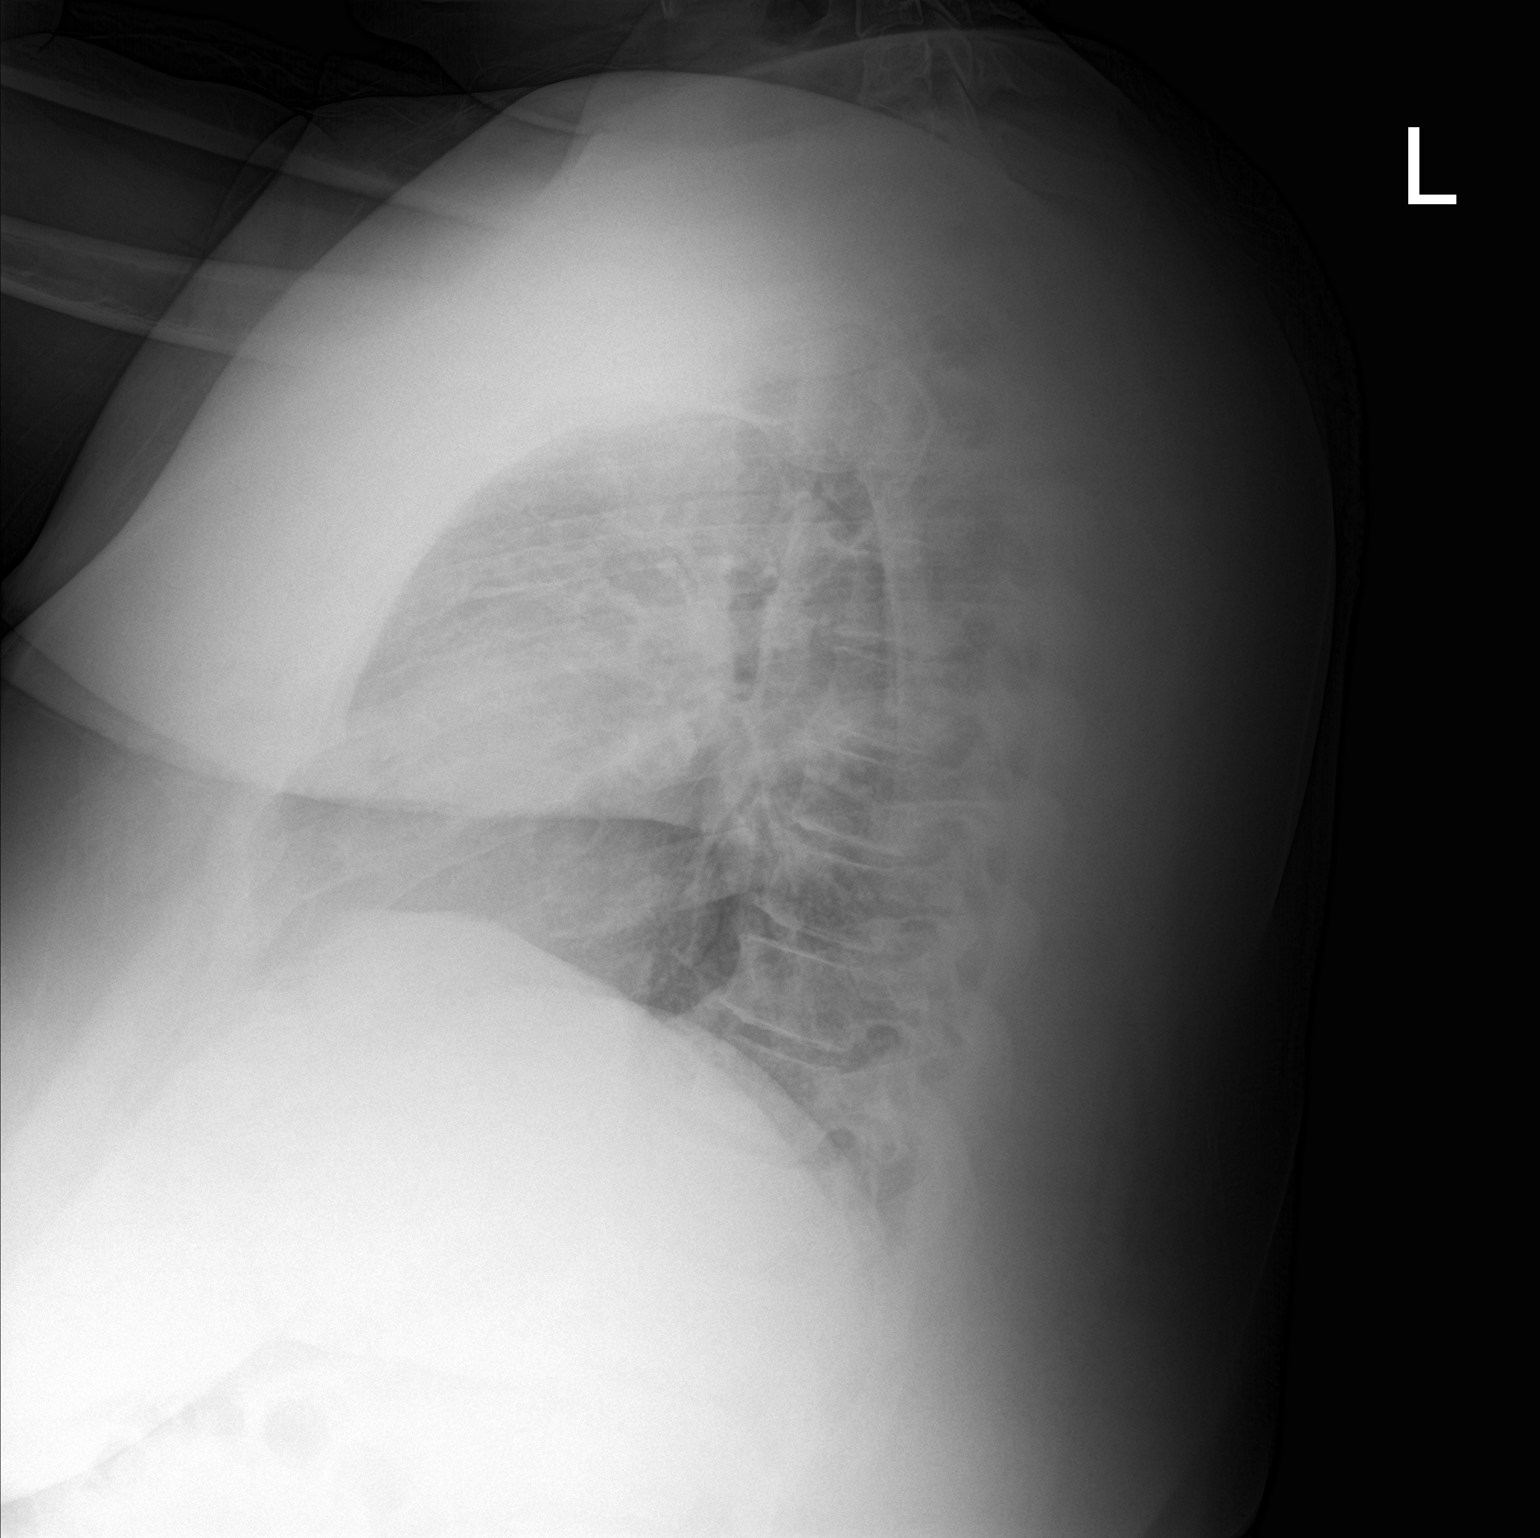

[2 of 2 positions shown; findings below may reference images not displayed]

FINDINGS: Lungs are clear.

Heart size upper limits normal for technique. Normal pulmonary
vascularity.

No effusion.

Obesity. Vertebral endplate spurring at multiple levels in the lower
thoracic spine.
IMPRESSION: No acute cardiopulmonary disease.

## 2017-08-02 MED ORDER — BENZONATATE 100 MG PO CAPS: 100.0000 mg | ORAL_CAPSULE | Freq: Three times a day (TID) | ORAL | 0 refills | Status: DC

## 2017-08-02 MED ORDER — FLUTICASONE PROPIONATE 50 MCG/ACT NA SUSP: 1.0000 | Freq: Every day | NASAL | 2 refills | Status: DC

## 2017-08-02 MED ORDER — ALBUTEROL SULFATE (2.5 MG/3ML) 0.083% IN NEBU
INHALATION_SOLUTION | RESPIRATORY_TRACT | Status: AC
  Administered 2017-08-02: 2.5 mg
  Filled 2017-08-02: qty 3

## 2017-08-02 MED ORDER — IPRATROPIUM-ALBUTEROL 0.5-2.5 (3) MG/3ML IN SOLN
RESPIRATORY_TRACT | Status: AC
  Administered 2017-08-02: 3 mL
  Filled 2017-08-02: qty 3

## 2017-08-02 MED ORDER — ALBUTEROL SULFATE (2.5 MG/3ML) 0.083% IN NEBU
2.5000 mg | INHALATION_SOLUTION | Freq: Once | RESPIRATORY_TRACT | Status: AC
  Administered 2017-08-02: 2.5 mg via RESPIRATORY_TRACT
  Filled 2017-08-02: qty 3

## 2017-08-02 MED ORDER — PREDNISONE 10 MG PO TABS: 40.0000 mg | ORAL_TABLET | Freq: Every day | ORAL | 0 refills | Status: AC

## 2017-08-02 NOTE — Discharge Instructions (Signed)
Take Prednisone as directed.   Use Flonase for congestion.   Use Tessalon Perles for cough.  Use your inhaler as directed.   Follow-up with your primary care doctor in the next 24-48 hours.   Return to the Emergency Department for any worsening cough, difficulty breathing, chest pain, vomiting, fever or any other worsening or concerning symptoms.

## 2017-08-02 NOTE — ED Provider Notes (Signed)
[facility] EMERGENCY DEPARTMENT Provider Note   CSN: [id number] Arrival date & time: 08/02/17  [id number]     History   Chief Complaint Chief Complaint  Patient presents with  . URI    HPI Christina [name] is a 37 y.o. female past medical history asthma, hypertension who presents for evaluation of 4 days of cough, nasal congestion, rhinorrhea and sore throat.  Patient also reports she has had some associated intermittent SOB and chest tightness.  She states that she has a history of asthma.  She reports she has been using her inhaler but denies any increased use in her inhaler.  She reports cough is productive of green phlegm.  Patient reports that she has not taken any medication for sore throat.  Patient reports that one day she took NyQuil but states it did not help she has not taken any repeat doses.  Patient reports that she has not measured any fever.  She is still been able to eat and drink without any difficulty.  Patient has been able to tolerate her secretions and p.o. without any difficulty.  Patient denies any fever, chest pain, abdominal pain, nausea/vomiting.  Patient reports she has been hospitalized for asthma but denies any intubation history.  The history is provided by the patient.    Past Medical History:  Diagnosis Date  . Asthma   . Hypertension     There are no active problems to display for this patient.   Past Surgical History:  Procedure Laterality Date  . TUBAL LIGATION       OB History   None      Home Medications    Prior to Admission medications   Medication Sig Start Date End Date Taking? Authorizing Provider  albuterol (PROVENTIL HFA;VENTOLIN HFA) 108 (90 BASE) MCG/ACT inhaler Inhale 2 puffs into the lungs every 6 (six) hours as needed for wheezing.   Yes [provider]  benzonatate (TESSALON) 100 MG capsule Take 1 capsule (100 mg total) by mouth every 8 (eight) hours. 08/02/17   Maxwell CaulLayden, Lindsey A, PA-C  cholecalciferol  (VITAMIN D) 1000 UNITS tablet Take 1,000 Units by mouth once a week.    [provider]  fluticasone (FLONASE) 50 MCG/ACT nasal spray Place 1 spray into both nostrils daily. 08/02/17   Maxwell CaulLayden, Lindsey A, PA-C  hydrochlorothiazide (HYDRODIURIL) 12.5 MG tablet Take 12.5 mg by mouth daily.    [provider]  hydrochlorothiazide (MICROZIDE) 12.5 MG capsule Take 1 capsule (12.5 mg total) by mouth daily. 12/26/12   Rolan BuccoBelfi, Melanie, MD  HYDROcodone-acetaminophen (NORCO/VICODIN) 5-325 MG per tablet Take 2 tablets by mouth every 6 (six) hours as needed. 07/06/13   Charlynne PanderYao, David Hsienta, MD  lisinopril (PRINIVIL,ZESTRIL) 20 MG tablet Take 2 tablets (40 mg total) by mouth daily. 12/26/12   Rolan BuccoBelfi, Melanie, MD  lisinopril (PRINIVIL,ZESTRIL) 40 MG tablet Take 40 mg by mouth daily.    [provider]  loratadine (CLARITIN) 10 MG tablet Take 10 mg by mouth daily.    [provider]  loratadine (CLARITIN) 10 MG tablet Take 1 tablet (10 mg total) by mouth daily. 12/26/12   Rolan BuccoBelfi, Melanie, MD  metoprolol succinate (TOPROL-XL) 100 MG 24 hr tablet Take 100 mg by mouth daily. Take with or immediately following a meal.    [provider]  metoprolol succinate (TOPROL-XL) 25 MG 24 hr tablet Take 4 tablets (100 mg total) by mouth daily. 12/26/12   Rolan BuccoBelfi, Melanie, MD  predniSONE (DELTASONE) 10 MG tablet Take 4  tablets (40 mg total) by mouth daily for 4 days. 08/02/17 08/06/17  Maxwell Caul, PA-C  sulfamethoxazole-trimethoprim (SEPTRA DS) 800-160 MG per tablet Take 1 tablet by mouth 2 (two) times daily. 07/06/13   Charlynne Pander, MD    Family History No family history on file.  Social History Social History   Tobacco Use  . Smoking status: Current Every Day Smoker    Packs/day: 0.50  . Smokeless tobacco: Never Used  Substance Use Topics  . Alcohol use: Yes    Comment: occ  . Drug use: No     Allergies   Peanut-containing drug products and Tomato   Review of  Systems Review of Systems  Constitutional: Negative for fever.  HENT: Positive for congestion, rhinorrhea and sore throat. Negative for drooling and trouble swallowing.   Respiratory: Positive for chest tightness. Negative for shortness of breath.   Cardiovascular: Negative for chest pain.  Gastrointestinal: Negative for abdominal pain, diarrhea and vomiting.     Physical Exam Updated Vital Signs BP 120/84 (BP Location: Right Arm)   Pulse 68   Temp 97.9 F (36.6 C) (Oral)   Resp 20   Ht 5\' 9"  (1.753 m)   Wt (!) 146.5 kg (323 lb)   LMP 07/30/2017   SpO2 98%   BMI 47.70 kg/m   Physical Exam  Constitutional: She appears well-developed and well-nourished.  HENT:  Head: Normocephalic and atraumatic.  Nose: Mucosal edema and rhinorrhea present.  Mouth/Throat: Uvula is midline and mucous membranes are normal. No trismus in the jaw. Posterior oropharyngeal erythema present.  Airways patent, phonation is normal.  Posterior oropharynx is slightly erythematous.  No evidence of uvular deviation.  No trismus.  Eyes: Conjunctivae and EOM are normal. Right eye exhibits no discharge. Left eye exhibits no discharge. No scleral icterus.  Pulmonary/Chest: Effort normal. She has wheezes.  Faint expiratory wheezing heard at the upper lung fields.  Able to speak in full sentences without any difficulty.  No evidence of respiratory distress.  Neurological: She is alert.  Skin: Skin is warm and dry.  Psychiatric: She has a normal mood and affect. Her speech is normal and behavior is normal.  Nursing note and vitals reviewed.    ED Treatments / Results  Labs (all labs ordered are listed, but only abnormal results are displayed) Labs Reviewed  RAPID STREP SCREEN ([facility] & [facility] ONLY)  CULTURE, GROUP A STREP [facility])    EKG None  Radiology Dg Chest 2 View  Result Date: 08/02/2017 CLINICAL DATA:  37 year-old female c/o productive cough w/ green sputum and SOB x 4 days. Pt denies fever. Hx of  asthma, HTN, Smoker x < 1ppd EXAM: CHEST - 2 VIEW COMPARISON:  [id number] FINDINGS: Lungs are clear. Heart size upper limits normal for technique. Normal pulmonary vascularity. No effusion. Obesity. Vertebral endplate spurring at multiple levels in the lower thoracic spine. IMPRESSION: No acute cardiopulmonary disease. Electronically Signed   By: Corlis Leak M.D.   On: 08/02/2017 11:09    Procedures Procedures (including critical care time)  Medications Ordered in ED Medications  albuterol (PROVENTIL) (2.5 MG/3ML) 0.083% nebulizer solution (2.5 mg  Given 08/02/17 1012)  ipratropium-albuterol (DUONEB) 0.5-2.5 (3) MG/3ML nebulizer solution (3 mLs  Given 08/02/17 1012)  albuterol (PROVENTIL) (2.5 MG/3ML) 0.083% nebulizer solution 2.5 mg (2.5 mg Nebulization Given 08/02/17 1051)     Initial Impression / Assessment and Plan / ED Course  I have reviewed the triage vital signs and the nursing notes.  Pertinent  labs & imaging results that were available during my care of the patient were reviewed by me and considered in my medical decision making (see chart for details).     37 year old female who presents for evaluation of 4 days of nasal congestion, rhinorrhea, cough, sore throat.  Also reports some intermittent symptoms associated shortness of breath and chest tightness.  Does have a history of asthma.  She has been using her inhalers but has not had to increase her use.  No fevers, difficulty swallowing, vomiting. Patient is afebrile, non-toxic appearing, sitting comfortably on examination table. Vital signs reviewed and stable.  Patient with no evidence of respiratory distress on exam.  Able speak in full sentences without any difficulty.  She does have some faint wheezing noted to the upper lung fields.  Posterior oropharynx is erythematous.  Consider pharyngitis versus viral URI versus pneumonia versus asthma exacerbation.  Plan for chest x-ray, rapid strep, breathing treatment here in the  department.  Vitals stable.  No evidence of hypoxia.  Rapid strep negative.  Chest x-ray reveals no infiltrates suspicious for pneumonia.  Reevaluation after breathing treatment.  Wheezing has improved.  Patient exhibits no signs of respiratory distress.  Plan to treat as viral URI with cough.  Will plan to start on short course of prednisone burst for symptomatic relief. Patient states she does not need refills on inhaler. Patient had ample opportunity for questions and discussion. All patient's questions were answered with full understanding. Strict return precautions discussed. Patient expresses understanding and agreement to plan.   Final Clinical Impressions(s) / ED Diagnoses   Final diagnoses:  Viral URI with cough  Sore throat    ED Discharge Orders        Ordered    fluticasone (FLONASE) 50 MCG/ACT nasal spray  Daily     08/02/17 1126    predniSONE (DELTASONE) 10 MG tablet  Daily     08/02/17 1126    benzonatate (TESSALON) 100 MG capsule  Every 8 hours     08/02/17 1126       Maxwell Caul, PA-C 08/02/17 1134    Tegeler, Canary Brim, MD 08/02/17 1553

## 2017-08-02 NOTE — ED Notes (Signed)
Pt verbalized understanding of discharge instructions.

## 2017-08-02 NOTE — ED Triage Notes (Signed)
Cough, nasal  congestion and sore throat x 4 days. Denies fevers

## 2017-08-04 LAB — CULTURE, GROUP A STREP (THRC)

## 2018-06-15 ENCOUNTER — Other Ambulatory Visit: Payer: Self-pay

## 2018-06-15 ENCOUNTER — Emergency Department (HOSPITAL_BASED_OUTPATIENT_CLINIC_OR_DEPARTMENT_OTHER): Payer: BLUE CROSS/BLUE SHIELD

## 2018-06-15 ENCOUNTER — Encounter (HOSPITAL_BASED_OUTPATIENT_CLINIC_OR_DEPARTMENT_OTHER): Payer: Self-pay | Admitting: Emergency Medicine

## 2018-06-15 ENCOUNTER — Emergency Department (HOSPITAL_BASED_OUTPATIENT_CLINIC_OR_DEPARTMENT_OTHER)
Admission: EM | Admit: 2018-06-15 | Discharge: 2018-06-15 | Disposition: A | Discharge status: Home / Self Care | Payer: BLUE CROSS/BLUE SHIELD | Attending: Emergency Medicine | Admitting: Emergency Medicine

## 2018-06-15 DIAGNOSIS — F17210 Nicotine dependence, cigarettes, uncomplicated: Secondary | ICD-10-CM | POA: Diagnosis not present

## 2018-06-15 DIAGNOSIS — Z7984 Long term (current) use of oral hypoglycemic drugs: Secondary | ICD-10-CM | POA: Diagnosis not present

## 2018-06-15 DIAGNOSIS — I10 Essential (primary) hypertension: Secondary | ICD-10-CM | POA: Diagnosis not present

## 2018-06-15 DIAGNOSIS — Z79899 Other long term (current) drug therapy: Secondary | ICD-10-CM | POA: Insufficient documentation

## 2018-06-15 DIAGNOSIS — Z91010 Allergy to peanuts: Secondary | ICD-10-CM | POA: Insufficient documentation

## 2018-06-15 DIAGNOSIS — R030 Elevated blood-pressure reading, without diagnosis of hypertension: Secondary | ICD-10-CM

## 2018-06-15 DIAGNOSIS — R109 Unspecified abdominal pain: Secondary | ICD-10-CM | POA: Diagnosis present

## 2018-06-15 DIAGNOSIS — J45909 Unspecified asthma, uncomplicated: Secondary | ICD-10-CM | POA: Insufficient documentation

## 2018-06-15 DIAGNOSIS — R739 Hyperglycemia, unspecified: Secondary | ICD-10-CM | POA: Insufficient documentation

## 2018-06-15 LAB — URINALYSIS, ROUTINE W REFLEX MICROSCOPIC
Bilirubin Urine: NEGATIVE
Glucose, UA: >=500 mg/dL — AB
Ketones, ur: NEGATIVE mg/dL
Leukocytes,Ua: NEGATIVE
Nitrite: NEGATIVE
Protein, ur: NEGATIVE mg/dL
Specific Gravity, Urine: 1.02 (ref 1.005–1.030)
pH: 6 (ref 5.0–8.0)

## 2018-06-15 LAB — CBC WITH DIFFERENTIAL/PLATELET
Abs Immature Granulocytes: 0.04 10*3/uL (ref 0.00–0.07)
Basophils Absolute: 0.1 10*3/uL (ref 0.0–0.1)
Basophils Relative: 1 %
Eosinophils Absolute: 0.2 10*3/uL (ref 0.0–0.5)
Eosinophils Relative: 2 %
HCT: 44.2 % (ref 36.0–46.0)
Hemoglobin: 14.5 g/dL (ref 12.0–15.0)
Immature Granulocytes: 0 %
Lymphocytes Relative: 43 %
Lymphs Abs: 4.3 10*3/uL — ABNORMAL HIGH (ref 0.7–4.0)
MCH: 31 pg (ref 26.0–34.0)
MCHC: 32.8 g/dL (ref 30.0–36.0)
MCV: 94.6 fL (ref 80.0–100.0)
Monocytes Absolute: 0.6 10*3/uL (ref 0.1–1.0)
Monocytes Relative: 6 %
Neutro Abs: 4.9 10*3/uL (ref 1.7–7.7)
Neutrophils Relative %: 48 %
Platelets: 330 10*3/uL (ref 150–400)
RBC: 4.67 MIL/uL (ref 3.87–5.11)
RDW: 11.9 % (ref 11.5–15.5)
WBC: 10.1 10*3/uL (ref 4.0–10.5)
nRBC: 0 % (ref 0.0–0.2)

## 2018-06-15 LAB — URINALYSIS, MICROSCOPIC (REFLEX): WBC, UA: NONE SEEN WBC/hpf (ref 0–5)

## 2018-06-15 LAB — COMPREHENSIVE METABOLIC PANEL
ALT: 29 U/L (ref 0–44)
AST: 26 U/L (ref 15–41)
Albumin: 3.9 g/dL (ref 3.5–5.0)
Alkaline Phosphatase: 65 U/L (ref 38–126)
Anion gap: 9 (ref 5–15)
BUN: 10 mg/dL (ref 6–20)
CO2: 26 mmol/L (ref 22–32)
Calcium: 8.9 mg/dL (ref 8.9–10.3)
Chloride: 95 mmol/L — ABNORMAL LOW (ref 98–111)
Creatinine, Ser: 0.86 mg/dL (ref 0.44–1.00)
GFR calc Af Amer: >60 mL/min (ref >60)
GFR calc non Af Amer: >60 mL/min (ref >60)
Glucose, Bld: 400 mg/dL — ABNORMAL HIGH (ref 70–99)
Potassium: 3.9 mmol/L (ref 3.5–5.1)
Sodium: 130 mmol/L — ABNORMAL LOW (ref 135–145)
Total Bilirubin: 0.4 mg/dL (ref 0.3–1.2)
Total Protein: 7.8 g/dL (ref 6.5–8.1)

## 2018-06-15 LAB — PREGNANCY, URINE: Preg Test, Ur: NEGATIVE

## 2018-06-15 LAB — LIPASE, BLOOD: Lipase: 26 U/L (ref 11–51)

## 2018-06-15 IMAGING — CR ABDOMEN - 1 VIEW
2 series · 2 of 2 positions shown · non-contrast
Comparison: None.

CLINICAL DATA: Abdominal pain and nausea for 2 weeks. Frequent
urination.

EXAM:
ABDOMEN - 1 VIEW

[t abdomen supine (1 of 2)]
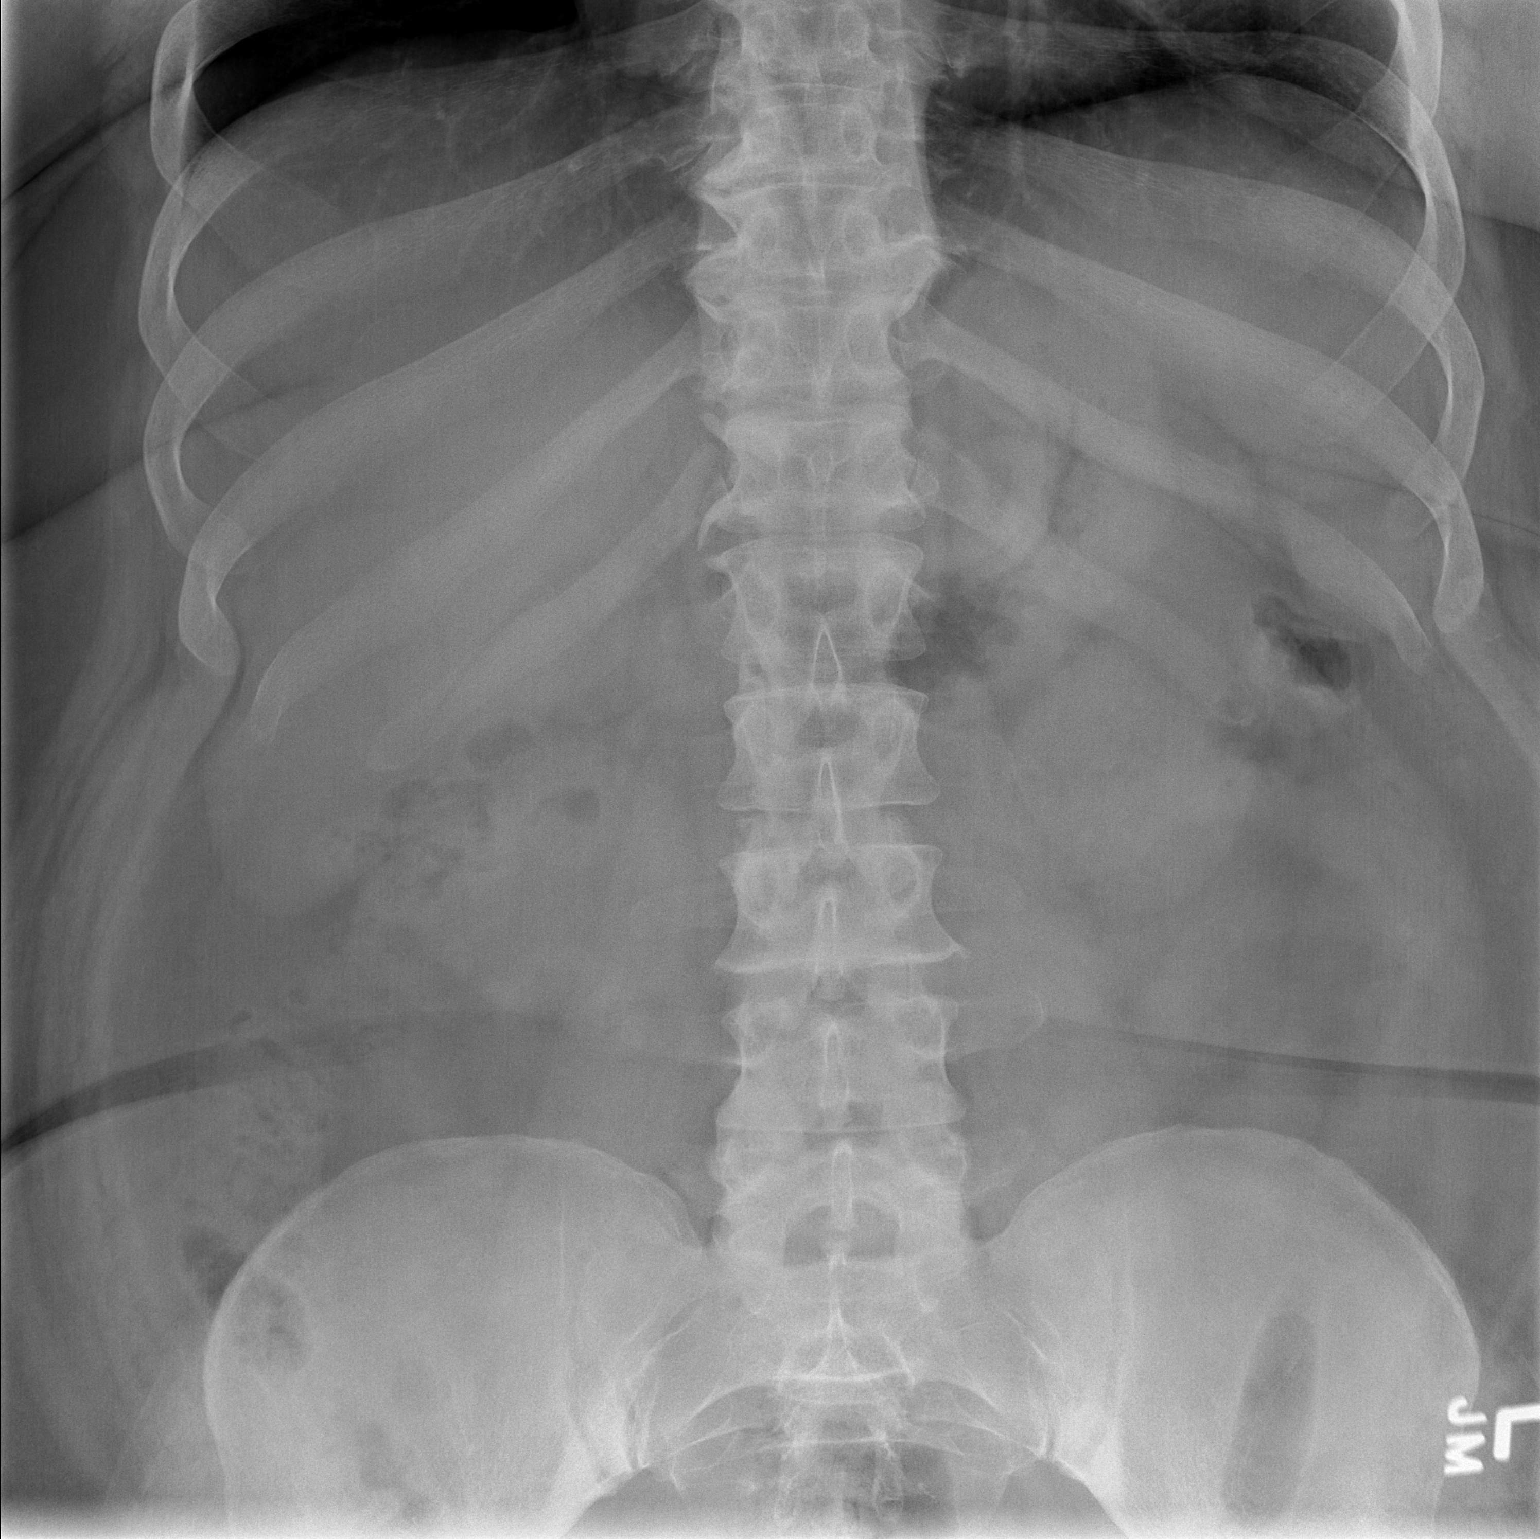

[t abdomen supine (2 of 2)]
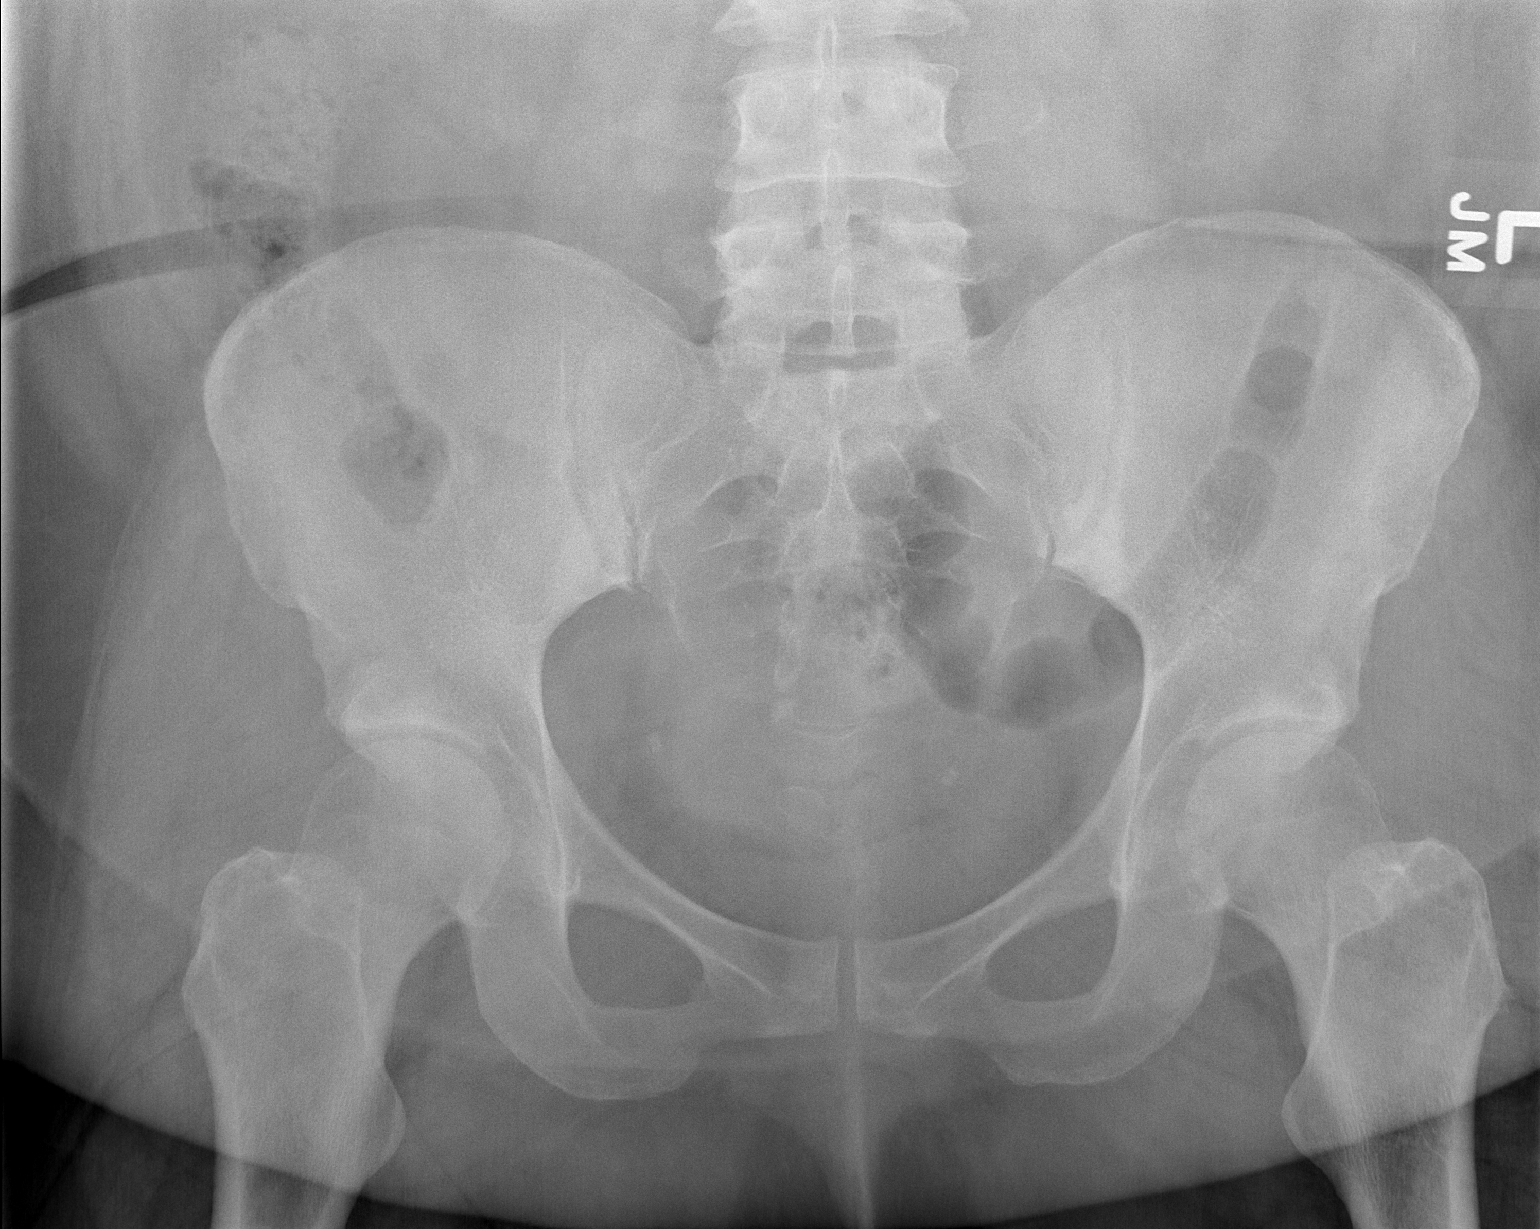

[2 of 2 positions shown; findings below may reference images not displayed]

FINDINGS: The bowel gas pattern is normal. No radio-opaque calculi or other
significant radiographic abnormality are seen.
IMPRESSION: Negative exam.

## 2018-06-15 MED ORDER — ONDANSETRON 4 MG PO TBDP
4.0000 mg | ORAL_TABLET | Freq: Once | ORAL | Status: AC
  Administered 2018-06-15: 4 mg via ORAL
  Filled 2018-06-15: qty 1

## 2018-06-15 MED ORDER — METFORMIN HCL 500 MG PO TABS: 500.0000 mg | ORAL_TABLET | Freq: Every day | ORAL | 0 refills | Status: DC

## 2018-06-15 NOTE — ED Provider Notes (Signed)
[facility] EMERGENCY DEPARTMENT Provider Note   CSN: [id number] Arrival date & time: 06/15/18  2133  History   Chief Complaint Chief Complaint  Patient presents with   Abdominal Pain   HPI Christina [name] is a 38 y.o. female with past medical history significant for asthma, HTN who presents for evaluation of multiple complaints.  Patient with abd pain x 2 weeks. States she has pain when "I push on my abdomen." States she has no pain when she is not pressing on her abdomen. She rates her current pain a 0/10. She she palpates her abdomen her pain is a 4/10. Denies radiation of pain. Pain is located to the generalized entire abdomen. Denies pain worse with food intake, hx pancreatitis, NSAID use, alcohol use, hx constipation. Denies chance of pregnancy. No pain located to her pelvis, vaginal discharge, abnormal vaginal bleeding, concern for STD. Has had associated nausea without emesis.  She has also had increased urination over the last 10 days. No associated burning with urination, hematuria, flank pain. No polydipsia or hx of DM. States she has been on Prednisone recently.  Also with complaints of bilateral intermittent arm numbness x 3 weeks. Denies neck trauma, injury, States she normally wakes with the numbness and this dissipates throughout the early morning. States sometimes the numbness will occur in just her bilateral hands. She does have a history of carpal tunnel. States she did start to use her hand braces to sleep at night she started wearing last night. No recent chiropractic adjustments.  Denies unilateral numbness, weakness, facial droop, dysphagia, headache, dizziness, lightheadedness, fever, chills, emesis, HA, CP, neck pain, neck stiffness, CP, SOB, cough, RUQ pain, RLQ pain, pelvic pain, abd vaginal bleeding, concern for STD, flank pain, hx of stone, abd pain that radiates into back.  Has not taken anything for her symptoms. No current pain.  History obtained  from patient. No interpretor was used.    HPI  Past Medical History:  Diagnosis Date   Asthma    Hypertension     There are no active problems to display for this patient.   Past Surgical History:  Procedure Laterality Date   TUBAL LIGATION       OB History   No obstetric history on file.      Home Medications    Prior to Admission medications   Medication Sig Start Date End Date Taking? Authorizing Provider  albuterol (PROVENTIL HFA;VENTOLIN HFA) 108 (90 BASE) MCG/ACT inhaler Inhale 2 puffs into the lungs every 6 (six) hours as needed for wheezing.    [provider]  benzonatate (TESSALON) 100 MG capsule Take 1 capsule (100 mg total) by mouth every 8 (eight) hours. 08/02/17   Maxwell Caul, PA-C  cholecalciferol (VITAMIN D) 1000 UNITS tablet Take 1,000 Units by mouth once a week.    [provider]  fluticasone (FLONASE) 50 MCG/ACT nasal spray Place 1 spray into both nostrils daily. 08/02/17   Maxwell Caul, PA-C  hydrochlorothiazide (HYDRODIURIL) 12.5 MG tablet Take 12.5 mg by mouth daily.    [provider]  hydrochlorothiazide (MICROZIDE) 12.5 MG capsule Take 1 capsule (12.5 mg total) by mouth daily. 12/26/12   Rolan Bucco, MD  HYDROcodone-acetaminophen (NORCO/VICODIN) 5-325 MG per tablet Take 2 tablets by mouth every 6 (six) hours as needed. 07/06/13   Charlynne Pander, MD  lisinopril (PRINIVIL,ZESTRIL) 20 MG tablet Take 2 tablets (40 mg total) by mouth daily. 12/26/12   Rolan Bucco, MD  lisinopril (PRINIVIL,ZESTRIL) 40 MG  tablet Take 40 mg by mouth daily.    [provider]  loratadine (CLARITIN) 10 MG tablet Take 10 mg by mouth daily.    [provider]  loratadine (CLARITIN) 10 MG tablet Take 1 tablet (10 mg total) by mouth daily. 12/26/12   Rolan Bucco, MD  metFORMIN (GLUCOPHAGE) 500 MG tablet Take 1 tablet (500 mg total) by mouth daily with breakfast for 30 days. 06/15/18 07/15/18  Analaya Hoey A, PA-C   metoprolol succinate (TOPROL-XL) 100 MG 24 hr tablet Take 100 mg by mouth daily. Take with or immediately following a meal.    [provider]  metoprolol succinate (TOPROL-XL) 25 MG 24 hr tablet Take 4 tablets (100 mg total) by mouth daily. 12/26/12   Rolan Bucco, MD  sulfamethoxazole-trimethoprim (SEPTRA DS) 800-160 MG per tablet Take 1 tablet by mouth 2 (two) times daily. 07/06/13   Charlynne Pander, MD    Family History No family history on file.  Social History Social History   Tobacco Use   Smoking status: Current Every Day Smoker    Packs/day: 0.50   Smokeless tobacco: Never Used  Substance Use Topics   Alcohol use: Yes    Comment: occ   Drug use: No     Allergies   Peanut-containing drug products and Tomato   Review of Systems Review of Systems  Constitutional: Negative.   HENT: Negative.   Eyes: Negative.   Respiratory: Negative.   Cardiovascular: Negative.   Gastrointestinal: Positive for abdominal pain and nausea. Negative for anal bleeding, blood in stool, constipation, diarrhea, rectal pain and vomiting.  Genitourinary: Positive for frequency. Negative for decreased urine volume, difficulty urinating, dyspareunia, dysuria, enuresis, flank pain, hematuria, menstrual problem, pelvic pain, urgency, vaginal bleeding, vaginal discharge and vaginal pain.  Musculoskeletal: Negative.   Skin: Negative.   Neurological: Positive for numbness. Negative for dizziness, tremors, seizures, syncope, facial asymmetry, speech difficulty, weakness, light-headedness and headaches.  All other systems reviewed and are negative.  Physical Exam Updated Vital Signs BP (!) 144/100 (BP Location: Right Arm)    Pulse 84    Temp 98.6 F (37 C) (Oral)    Resp 16    Ht  (1.753 m)    Wt (!) 142.9 kg    LMP 06/01/2018    SpO2 98%    BMI 46.52 kg/m   Physical Exam Vitals signs and nursing note reviewed.  Constitutional:      General: She is not in acute distress.     Appearance: She is well-developed. She is not ill-appearing, toxic-appearing or diaphoretic.  HENT:     Head: Normocephalic and atraumatic.     Jaw: There is normal jaw occlusion.     Nose: Nose normal.     Mouth/Throat:     Lips: Pink.     Mouth: Mucous membranes are moist.     Tonsils: No tonsillar exudate or tonsillar abscesses. 0 on the right. 0 on the left.     Comments: Mucous membranes moist. Posterior oropharynx clear. Uvula midline without deviation. Eyes:     Pupils: Pupils are equal, round, and reactive to light.     Comments: No horizontal, vertical or rotational nystagmus   Neck:     Musculoskeletal: Full passive range of motion without pain, normal range of motion and neck supple. Normal range of motion. No edema, erythema, neck rigidity, crepitus, injury, pain with movement, torticollis, spinous process tenderness or muscular tenderness.     Trachea: Phonation normal.  Comments: Full active and passive ROM without pain No midline or paraspinal tenderness No nuchal rigidity or meningeal signs  Cardiovascular:     Rate and Rhythm: Normal rate.     Pulses: Normal pulses.     Heart sounds: Normal heart sounds.  Pulmonary:     Effort: Pulmonary effort is normal. No tachypnea or respiratory distress.     Breath sounds: Normal breath sounds and air entry.     Comments: Clear bilaterally without wheeze, rhonchi or rales. No assessory muscle usage. Chest:     Comments: No chest wall TTP. Abdominal:     General: Bowel sounds are normal. There is no distension.     Palpations: Abdomen is soft.     Tenderness: There is no abdominal tenderness. There is no right CVA tenderness, left CVA tenderness, guarding or rebound. Negative signs include Murphy's sign, Rovsing's sign and McBurney's sign.     Hernia: No hernia is present.     Comments: Exam difficult secondary to morbid obesity.  She has no generalized tenderness palpation. No focal tenderness. No CVA tenderness no rebound  or guarding.  Normoactive bowel sounds.  Musculoskeletal: Normal range of motion.     Cervical back: Normal.     Thoracic back: Normal.     Comments: Moves all 4 extremities without difficulty.  Lymphadenopathy:     Cervical: No cervical adenopathy.  Skin:    General: Skin is warm and dry.     Comments: No rashes or lesions.   Neurological:     Mental Status: She is alert.     Comments: Mental Status:  Alert, oriented, thought content appropriate. Speech fluent without evidence of aphasia. Able to follow 2 step commands without difficulty.  Cranial Nerves:  II:  Peripheral visual fields grossly normal, pupils equal, round, reactive to light III,IV, VI: ptosis not present, extra-ocular motions intact bilaterally  V,VII: smile symmetric, facial light touch sensation equal VIII: hearing grossly normal bilaterally  IX,X: midline uvula rise  XI: bilateral shoulder shrug equal and strong XII: midline tongue extension  Motor:  5/5 in upper and lower extremities bilaterally including strong and equal grip strength and dorsiflexion/plantar flexion Sensory: Pinprick and light touch normal in all extremities.  Deep Tendon Reflexes: 2+ and symmetric  Cerebellar: normal finger-to-nose with bilateral upper extremities Gait: normal gait and balance CV: distal pulses palpable throughout      ED Treatments / Results  Labs (all labs ordered are listed, but only abnormal results are displayed) Labs Reviewed  URINALYSIS, ROUTINE W REFLEX MICROSCOPIC - Abnormal; Notable for the following components:      Result Value   Glucose, UA >=500 (*)    Hgb urine dipstick SMALL (*)    All other components within normal limits  CBC WITH DIFFERENTIAL/PLATELET - Abnormal; Notable for the following components:   Lymphs Abs 4.3 (*)    All other components within normal limits  COMPREHENSIVE METABOLIC PANEL - Abnormal; Notable for the following components:   Sodium 130 (*)    Chloride 95 (*)    Glucose, Bld  400 (*)    All other components within normal limits  URINALYSIS, MICROSCOPIC (REFLEX) - Abnormal; Notable for the following components:   Bacteria, UA RARE (*)    All other components within normal limits  PREGNANCY, URINE  LIPASE, BLOOD    EKG None  Radiology Dg Abdomen 1 View  Result Date: 06/15/2018 CLINICAL DATA:  Abdominal pain and nausea for 2 weeks. Frequent urination. EXAM: ABDOMEN -  1 VIEW COMPARISON:  None. FINDINGS: The bowel gas pattern is normal. No radio-opaque calculi or other significant radiographic abnormality are seen. IMPRESSION: Negative exam. Electronically Signed   By: Drusilla Kanner M.D.   On: 06/15/2018 22:40    Procedures Procedures (including critical care time)  Medications Ordered in ED Medications  ondansetron (ZOFRAN-ODT) disintegrating tablet 4 mg (4 mg Oral Given 06/15/18 2207)   Initial Impression / Assessment and Plan / ED Course  I have reviewed the triage vital signs and the nursing notes.  Pertinent labs & imaging results that were available during my care of the patient were reviewed by me and considered in my medical decision making (see chart for details).  38 year old female who appears other wise well presents for evaluation of multiple complaints. Afebrile, non septic, non ill appearing. Abd x 2 weeks only with palpation of abd. States no pain if not pressing on abd.  No associated emesis, diarrhea. Abd soft, non tender without rebound or guarding. No abd wall skin changes. No pelvic pain, vaginal discharge, abd vaginal bleeding. Negative Murphy sign, focal RLQ, LLQ pain. Abd pain with food intake. Denies NSAID, alcohol use, hx pancreatitis. Low suspicion for appendicitis, bowel obstruction, bowel perforation, cholecystitis, diverticulitis, PID or ectopic pregnancy, mesenteric ischemia.  Intermittent tingling in bilateral upper extremities x 2 weeks.  No neck trauma or neck injury.  No onset thunderclap headache, no history of head trauma or  neck trauma.  Symptoms are intermittent in nature.  She denies any unilateral weakness, unilateral paresthesias, facial asymmetry, fascia or facial drooping.  Low suspicion for CVA.  Her neck is without stiffness or rigidity. No meningismus. She is no tenderness palpation to midline cervical spine.  No recent chiropractic treatments.  Her numbness is subjective.  She has intact two-point discrimination bilateral upper extremities.  Has a normal neurologic exam without neurologic deficits and without weakness.  Low suspicion for dissection as cause of her symptoms.  Do not feel patient needs CT head or cervical spine imaging at this time.  Urinalysis negative for infection, however she does have greater than 500 glucose.  Does have blood in her urine, however is on her current cycle.  Her lipase is 26, her CBC is without leukocytosis, hemoglobin 14.5, metabolic with mild hyponatremia at 130, she is tolerating p.o. intake without difficulty.  She does have glucose of 400.  She does not have an anion gap metabolic acidosis, no evidence of DKA.  Suspicion that patient's increased urinary frequency, abdominal fullness, intermittent paresthesias are related to diabetes.  I have low suspicion for disc herniation, dissection, CVA, MI, infectious process as cause of her intermittent paresthesias.  On reevaluation her abdomen is soft, nontender without rebound or guarding.  She has a nonsurgical abdomen.  Her x-ray of her abdomen does not show any evidence of small bowel obstruction, volvulus, mass.  Given her symptoms have been present x2 weeks only occur when she presses on her abdomen I have low suspicion for acute abdominal process.  Will defer to PCP for reevaluation of this.  No evidence of acute pelvic pathology as cause of her frequent urination.  She has no concerns for STDs, no pelvic pain, vaginal discharge I have low suspicion for PID, TOA, torsion.  This again is likely related to her new onset  diabetes.  Patient is hemodynamically stable and in no acute distress.  Patient able to ambulate in department prior to ED.  Evaluation does not show acute pathology that would require  ongoing or additional emergent interventions while in the emergency department or further inpatient treatment.  I have discussed the diagnosis with the patient and answered all questions. Patient has no further complaints prior to discharge. Patient is comfortable with plan discussed in room and is stable for discharge at this time. I have discussed strict return precautions for returning to the emergency department.  Patient was encouraged to follow-up with PCP refer to at discharge.  Given resources for new onset diabetes.  Will DC home with prescription for metformin, at patient's request.  She is worried she will not be able to get in with her PCP to be evaluated secondary to coronavirus.     Final Clinical Impressions(s) / ED Diagnoses   Final diagnoses:  Elevated blood pressure reading  Hyperglycemia    ED Discharge Orders         Ordered    metFORMIN (GLUCOPHAGE) 500 MG tablet  Daily with breakfast     06/15/18 2254           Rayce Brahmbhatt A, PA-C 06/15/18 2306    Gwyneth Sprout, MD 06/16/18 2317

## 2018-06-15 NOTE — ED Notes (Signed)
Pt returned from xray

## 2018-06-15 NOTE — ED Notes (Signed)
Pt has multiple complaints that have been going on for the last several weeks. Pt c/o upper abdominal pain if she strains and presses on her upper abdomen, and c/o intermittent nausea. Also states her bilateral arms are numb, but has the same sensation on both sides and is able to use both arms and hands without difficulty.

## 2018-06-15 NOTE — ED Triage Notes (Signed)
Pt c/o abd pain for the past 2 weeks with nausea and frequent urination, pt states she has some numbness going from her neck down to bilateral arms and hands as well.

## 2018-06-15 NOTE — ED Notes (Signed)
Pt verbalizes understanding of d/c instructions and denies any further needs at this time. 

## 2018-06-15 NOTE — Discharge Instructions (Signed)
Evaluated today for multiple symptoms.  This is likely related to your new diabetes.  You will need to follow-up with your PCP for reevaluation in the next 1 to 2 weeks.  Given a prescription called metformin.  Please take as prescribed.  This may cause diarrhea.  This is normal.

## 2018-06-15 NOTE — ED Notes (Signed)
Patient transported to X-ray 

## 2018-12-01 ENCOUNTER — Emergency Department (HOSPITAL_COMMUNITY)
Admission: EM | Admit: 2018-12-01 | Discharge: 2018-12-02 | Disposition: A | Discharge status: Home / Self Care | Payer: BC Managed Care – PPO | Attending: Emergency Medicine | Admitting: Emergency Medicine

## 2018-12-01 ENCOUNTER — Other Ambulatory Visit: Payer: Self-pay

## 2018-12-01 ENCOUNTER — Encounter (HOSPITAL_COMMUNITY): Payer: Self-pay | Admitting: Family Medicine

## 2018-12-01 DIAGNOSIS — Z5321 Procedure and treatment not carried out due to patient leaving prior to being seen by health care provider: Secondary | ICD-10-CM | POA: Insufficient documentation

## 2018-12-01 DIAGNOSIS — M79602 Pain in left arm: Secondary | ICD-10-CM | POA: Insufficient documentation

## 2018-12-01 LAB — CBG MONITORING, ED: Glucose-Capillary: 262 mg/dL — ABNORMAL HIGH (ref 70–99)
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
## 2018-12-02 ENCOUNTER — Encounter (HOSPITAL_BASED_OUTPATIENT_CLINIC_OR_DEPARTMENT_OTHER): Payer: Self-pay | Admitting: Emergency Medicine

## 2018-12-02 ENCOUNTER — Emergency Department (HOSPITAL_BASED_OUTPATIENT_CLINIC_OR_DEPARTMENT_OTHER): Payer: BC Managed Care – PPO

## 2018-12-02 ENCOUNTER — Emergency Department (HOSPITAL_BASED_OUTPATIENT_CLINIC_OR_DEPARTMENT_OTHER)
Admission: EM | Admit: 2018-12-02 | Discharge: 2018-12-02 | Disposition: A | Discharge status: Home / Self Care | Payer: BC Managed Care – PPO | Source: Home / Self Care | Attending: Emergency Medicine | Admitting: Emergency Medicine

## 2018-12-02 ENCOUNTER — Other Ambulatory Visit: Payer: Self-pay

## 2018-12-02 DIAGNOSIS — J45909 Unspecified asthma, uncomplicated: Secondary | ICD-10-CM | POA: Insufficient documentation

## 2018-12-02 DIAGNOSIS — Y999 Unspecified external cause status: Secondary | ICD-10-CM | POA: Insufficient documentation

## 2018-12-02 DIAGNOSIS — S60511A Abrasion of right hand, initial encounter: Secondary | ICD-10-CM | POA: Insufficient documentation

## 2018-12-02 DIAGNOSIS — S60811A Abrasion of right wrist, initial encounter: Secondary | ICD-10-CM | POA: Insufficient documentation

## 2018-12-02 DIAGNOSIS — S60221A Contusion of right hand, initial encounter: Secondary | ICD-10-CM

## 2018-12-02 DIAGNOSIS — Y9389 Activity, other specified: Secondary | ICD-10-CM | POA: Insufficient documentation

## 2018-12-02 DIAGNOSIS — F17210 Nicotine dependence, cigarettes, uncomplicated: Secondary | ICD-10-CM | POA: Insufficient documentation

## 2018-12-02 DIAGNOSIS — Z7984 Long term (current) use of oral hypoglycemic drugs: Secondary | ICD-10-CM | POA: Insufficient documentation

## 2018-12-02 DIAGNOSIS — S40812A Abrasion of left upper arm, initial encounter: Secondary | ICD-10-CM | POA: Insufficient documentation

## 2018-12-02 DIAGNOSIS — Z79899 Other long term (current) drug therapy: Secondary | ICD-10-CM | POA: Insufficient documentation

## 2018-12-02 DIAGNOSIS — Y92410 Unspecified street and highway as the place of occurrence of the external cause: Secondary | ICD-10-CM | POA: Insufficient documentation

## 2018-12-02 DIAGNOSIS — Z880 Allergy status to penicillin: Secondary | ICD-10-CM | POA: Insufficient documentation

## 2018-12-02 DIAGNOSIS — T07XXXA Unspecified multiple injuries, initial encounter: Secondary | ICD-10-CM

## 2018-12-02 DIAGNOSIS — I10 Essential (primary) hypertension: Secondary | ICD-10-CM | POA: Insufficient documentation
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
## 2018-12-02 MED ORDER — CYCLOBENZAPRINE HCL 10 MG PO TABS: 10.0000 mg | ORAL_TABLET | Freq: Three times a day (TID) | ORAL | 0 refills | Status: DC | PRN

## 2018-12-02 MED ORDER — HYDROCODONE-ACETAMINOPHEN 5-325 MG PO TABS
1.0000 | ORAL_TABLET | Freq: Once | ORAL | Status: AC
  Administered 2018-12-02: 1 via ORAL
  Filled 2018-12-02: qty 1

## 2018-12-02 MED ORDER — DEXAMETHASONE SODIUM PHOSPHATE 10 MG/ML IJ SOLN
10.0000 mg | Freq: Once | INTRAMUSCULAR | Status: AC
  Administered 2018-12-02: 10 mg via INTRAMUSCULAR
  Filled 2018-12-02: qty 1

## 2018-12-02 MED ORDER — HYDROCODONE-ACETAMINOPHEN 5-325 MG PO TABS: 1.0000 | ORAL_TABLET | Freq: Four times a day (QID) | ORAL | 0 refills | Status: DC | PRN

## 2018-12-02 MED ORDER — CYCLOBENZAPRINE HCL 10 MG PO TABS
10.0000 mg | ORAL_TABLET | Freq: Once | ORAL | Status: AC
  Administered 2018-12-02: 10 mg via ORAL
  Filled 2018-12-02: qty 1

## 2018-12-02 MED ORDER — ALBUTEROL SULFATE HFA 108 (90 BASE) MCG/ACT IN AERS
2.0000 | INHALATION_SPRAY | RESPIRATORY_TRACT | Status: DC | PRN
  Administered 2018-12-02: 2 via RESPIRATORY_TRACT
  Filled 2018-12-02: qty 6.7

## 2018-12-02 NOTE — ED Notes (Signed)
Dressing to right hand

## 2018-12-02 NOTE — ED Triage Notes (Signed)
Pt wheezing mildly during triage. PT states lost inhaler PTA.
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
## 2018-12-02 NOTE — ED Notes (Signed)
ED Provider at bedside. 

## 2018-12-02 NOTE — ED Notes (Signed)
Patient notified registration that she was going to another hospital for treatment.
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
## 2019-09-16 ENCOUNTER — Encounter (HOSPITAL_BASED_OUTPATIENT_CLINIC_OR_DEPARTMENT_OTHER): Payer: Self-pay

## 2019-09-16 ENCOUNTER — Emergency Department (HOSPITAL_BASED_OUTPATIENT_CLINIC_OR_DEPARTMENT_OTHER)
Admission: EM | Admit: 2019-09-16 | Discharge: 2019-09-16 | Disposition: A | Discharge status: Home / Self Care | Payer: BC Managed Care – PPO | Attending: Emergency Medicine | Admitting: Emergency Medicine

## 2019-09-16 ENCOUNTER — Other Ambulatory Visit: Payer: Self-pay

## 2019-09-16 ENCOUNTER — Emergency Department (HOSPITAL_BASED_OUTPATIENT_CLINIC_OR_DEPARTMENT_OTHER): Payer: BC Managed Care – PPO

## 2019-09-16 DIAGNOSIS — Z79899 Other long term (current) drug therapy: Secondary | ICD-10-CM | POA: Diagnosis not present

## 2019-09-16 DIAGNOSIS — I10 Essential (primary) hypertension: Secondary | ICD-10-CM | POA: Insufficient documentation

## 2019-09-16 DIAGNOSIS — Z7951 Long term (current) use of inhaled steroids: Secondary | ICD-10-CM | POA: Diagnosis not present

## 2019-09-16 DIAGNOSIS — E119 Type 2 diabetes mellitus without complications: Secondary | ICD-10-CM | POA: Diagnosis not present

## 2019-09-16 DIAGNOSIS — R059 Cough, unspecified: Secondary | ICD-10-CM

## 2019-09-16 DIAGNOSIS — J45909 Unspecified asthma, uncomplicated: Secondary | ICD-10-CM | POA: Insufficient documentation

## 2019-09-16 DIAGNOSIS — R05 Cough: Secondary | ICD-10-CM | POA: Diagnosis present

## 2019-09-16 DIAGNOSIS — N3000 Acute cystitis without hematuria: Secondary | ICD-10-CM | POA: Insufficient documentation

## 2019-09-16 DIAGNOSIS — F17210 Nicotine dependence, cigarettes, uncomplicated: Secondary | ICD-10-CM | POA: Insufficient documentation

## 2019-09-16 DIAGNOSIS — Z7984 Long term (current) use of oral hypoglycemic drugs: Secondary | ICD-10-CM | POA: Diagnosis not present

## 2019-09-16 DIAGNOSIS — U071 COVID-19: Secondary | ICD-10-CM | POA: Diagnosis not present

## 2019-09-16 LAB — URINALYSIS, MICROSCOPIC (REFLEX)

## 2019-09-16 LAB — BASIC METABOLIC PANEL
Anion gap: 10 (ref 5–15)
BUN: 11 mg/dL (ref 6–20)
CO2: 27 mmol/L (ref 22–32)
Calcium: 8.6 mg/dL — ABNORMAL LOW (ref 8.9–10.3)
Chloride: 98 mmol/L (ref 98–111)
Creatinine, Ser: 0.88 mg/dL (ref 0.44–1.00)
GFR calc Af Amer: >60 mL/min (ref >60)
GFR calc non Af Amer: >60 mL/min (ref >60)
Glucose, Bld: 188 mg/dL — ABNORMAL HIGH (ref 70–99)
Potassium: 3.9 mmol/L (ref 3.5–5.1)
Sodium: 135 mmol/L (ref 135–145)

## 2019-09-16 LAB — CBC WITH DIFFERENTIAL/PLATELET
Abs Immature Granulocytes: 0.02 10*3/uL (ref 0.00–0.07)
Basophils Absolute: 0 10*3/uL (ref 0.0–0.1)
Basophils Relative: 0 %
Eosinophils Absolute: 0.1 10*3/uL (ref 0.0–0.5)
Eosinophils Relative: 1 %
HCT: 44.1 % (ref 36.0–46.0)
Hemoglobin: 14.2 g/dL (ref 12.0–15.0)
Immature Granulocytes: 0 %
Lymphocytes Relative: 43 %
Lymphs Abs: 3.1 10*3/uL (ref 0.7–4.0)
MCH: 30.5 pg (ref 26.0–34.0)
MCHC: 32.2 g/dL (ref 30.0–36.0)
MCV: 94.8 fL (ref 80.0–100.0)
Monocytes Absolute: 0.5 10*3/uL (ref 0.1–1.0)
Monocytes Relative: 7 %
Neutro Abs: 3.6 10*3/uL (ref 1.7–7.7)
Neutrophils Relative %: 49 %
Platelets: 296 10*3/uL (ref 150–400)
RBC: 4.65 MIL/uL (ref 3.87–5.11)
RDW: 12.1 % (ref 11.5–15.5)
WBC: 7.4 10*3/uL (ref 4.0–10.5)
nRBC: 0 % (ref 0.0–0.2)

## 2019-09-16 LAB — SARS CORONAVIRUS 2 BY RT PCR (HOSPITAL ORDER, PERFORMED IN ~~LOC~~ HOSPITAL LAB): SARS Coronavirus 2: POSITIVE — AB

## 2019-09-16 LAB — URINALYSIS, ROUTINE W REFLEX MICROSCOPIC
Bilirubin Urine: NEGATIVE
Glucose, UA: NEGATIVE mg/dL
Ketones, ur: NEGATIVE mg/dL
Leukocytes,Ua: NEGATIVE
Nitrite: NEGATIVE
Protein, ur: NEGATIVE mg/dL
Specific Gravity, Urine: 1.025 (ref 1.005–1.030)
pH: 6 (ref 5.0–8.0)

## 2019-09-16 LAB — PREGNANCY, URINE: Preg Test, Ur: NEGATIVE
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
## 2019-09-16 MED ORDER — FOSFOMYCIN TROMETHAMINE 3 G PO PACK: 3.0000 g | PACK | Freq: Once | ORAL | 0 refills | Status: AC

## 2019-09-16 MED ORDER — ONDANSETRON 4 MG PO TBDP: 4.0000 mg | ORAL_TABLET | Freq: Three times a day (TID) | ORAL | 0 refills | Status: DC | PRN

## 2019-09-16 NOTE — ED Notes (Signed)
Pt able to ambulate in room and speak while maintaining O2 of 94-96% room air. Schlossman MD made aware.

## 2019-09-16 NOTE — ED Triage Notes (Addendum)
Pt with flu like sx including n/v/d x 1 week with +covid exposure-no covid vaccine-NAD-steady gait-pt added she feel she has a "bladder infection" and injury to left pinky finger yesterday

## 2019-09-16 NOTE — Discharge Instructions (Addendum)
You will be called regarding infusion of monoclonal antibody for COVID 19 treatment.

## 2019-09-16 NOTE — ED Notes (Signed)
ED Provider at bedside. 

## 2019-09-16 NOTE — ED Notes (Signed)
Covid Swab obtained and to the lab 

## 2019-09-16 NOTE — ED Notes (Signed)
VS REASSESSED PT STATES SHE DID NOT TAKE HER BETA BLOCKER OR HER HCTZ TODAY
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
## 2019-09-16 NOTE — ED Notes (Signed)
AVS reviewed with client, opportunity for questions provided, also provided information on being Covid Positive and CDC guidelines.

## 2019-09-16 NOTE — ED Notes (Signed)
States this past Friday began not feeling well, began having HA then coughing, began then having productive cough with green sputum. Denies fevers. Been having N/V and D. Spoke with EDP , orders rec

## 2019-09-17 ENCOUNTER — Ambulatory Visit (HOSPITAL_COMMUNITY)
Admission: RE | Admit: 2019-09-17 | Discharge: 2019-09-17 | Disposition: A | Discharge status: Home / Self Care | Payer: BC Managed Care – PPO | Source: Ambulatory Visit | Attending: Pulmonary Disease | Admitting: Pulmonary Disease

## 2019-09-17 ENCOUNTER — Other Ambulatory Visit: Payer: Self-pay | Admitting: Physician Assistant

## 2019-09-17 DIAGNOSIS — U071 COVID-19: Secondary | ICD-10-CM | POA: Diagnosis not present

## 2019-09-17 DIAGNOSIS — I10 Essential (primary) hypertension: Secondary | ICD-10-CM | POA: Diagnosis present

## 2019-09-17 DIAGNOSIS — J45909 Unspecified asthma, uncomplicated: Secondary | ICD-10-CM | POA: Diagnosis present

## 2019-09-17 MED ORDER — METHYLPREDNISOLONE SODIUM SUCC 125 MG IJ SOLR: 125.0000 mg | Freq: Once | INTRAMUSCULAR | Status: DC | PRN

## 2019-09-17 MED ORDER — EPINEPHRINE 0.3 MG/0.3ML IJ SOAJ: 0.3000 mg | Freq: Once | INTRAMUSCULAR | Status: DC | PRN

## 2019-09-17 MED ORDER — SODIUM CHLORIDE 0.9 % IV SOLN: INTRAVENOUS | Status: DC | PRN

## 2019-09-17 MED ORDER — ALBUTEROL SULFATE HFA 108 (90 BASE) MCG/ACT IN AERS: 2.0000 | INHALATION_SPRAY | Freq: Once | RESPIRATORY_TRACT | Status: DC | PRN

## 2019-09-17 MED ORDER — SODIUM CHLORIDE 0.9 % IV SOLN
1200.0000 mg | Freq: Once | INTRAVENOUS | Status: AC
  Administered 2019-09-17: 1200 mg via INTRAVENOUS
  Filled 2019-09-17: qty 1200

## 2019-09-17 MED ORDER — DIPHENHYDRAMINE HCL 50 MG/ML IJ SOLN: 50.0000 mg | Freq: Once | INTRAMUSCULAR | Status: DC | PRN

## 2019-09-17 MED ORDER — FAMOTIDINE IN NACL 20-0.9 MG/50ML-% IV SOLN: 20.0000 mg | Freq: Once | INTRAVENOUS | Status: DC | PRN

## 2019-09-17 NOTE — Discharge Instructions (Signed)

## 2019-09-17 NOTE — Progress Notes (Signed)
  Diagnosis: COVID-19  Physician: Dr Wright  Procedure: Covid Infusion Clinic Med: casirivimab\imdevimab infusion - Provided patient with casirivimab\imdevimab fact sheet for patients, parents and caregivers prior to infusion.  Complications: No immediate complications noted.  Discharge: Discharged home   Christina [name] 09/17/2019   

## 2019-09-17 NOTE — Progress Notes (Signed)
I connected by phone with Christina [name] on 09/17/2019 at 9:57 AM to discuss the potential use of a new treatment for mild to moderate COVID-19 viral infection in non-hospitalized patients.  This patient is a 39 y.o. female that meets the FDA criteria for Emergency Use Authorization of COVID monoclonal antibody casirivimab/imdevimab.  Has a (+) direct SARS-CoV-2 viral test result  Has mild or moderate COVID-19   Is NOT hospitalized due to COVID-19  Is within 10 days of symptom onset  Has at least one of the high risk factor(s) for progression to severe COVID-19 and/or hospitalization as defined in EUA.  Specific high risk criteria : BMI > 25, asthma, HTN   I have spoken and communicated the following to the patient or parent/caregiver regarding COVID monoclonal antibody treatment:  1. FDA has authorized the emergency use for the treatment of mild to moderate COVID-19 in adults and pediatric patients with positive results of direct SARS-CoV-2 viral testing who are 55 years of age and older weighing at least 40 kg, and who are at high risk for progressing to severe COVID-19 and/or hospitalization.  2. The significant known and potential risks and benefits of COVID monoclonal antibody, and the extent to which such potential risks and benefits are unknown.  3. Information on available alternative treatments and the risks and benefits of those alternatives, including clinical trials.  4. Patients treated with COVID monoclonal antibody should continue to self-isolate and use infection control measures (e.g., wear mask, isolate, social distance, avoid sharing personal items, clean and disinfect "high touch" surfaces, and frequent handwashing) according to CDC guidelines.   5. The patient or parent/caregiver has the option to accept or refuse COVID monoclonal antibody treatment.  After reviewing this information with the patient, The patient agreed to proceed with receiving casirivimab\imdevimab  infusion and will be provided a copy of the Fact sheet prior to receiving the infusion.  Sx onset 8/6. Set up for infusion on 8/14 @ 12:30pm. Directions given to [facility]. Pt is aware that insurance will be charged an infusion fee.   Cline Crock 09/17/2019 9:57 AM

## 2019-12-26 ENCOUNTER — Encounter (HOSPITAL_BASED_OUTPATIENT_CLINIC_OR_DEPARTMENT_OTHER): Payer: Self-pay | Admitting: Emergency Medicine

## 2019-12-26 ENCOUNTER — Other Ambulatory Visit: Payer: Self-pay

## 2019-12-26 ENCOUNTER — Emergency Department (HOSPITAL_BASED_OUTPATIENT_CLINIC_OR_DEPARTMENT_OTHER)
Admission: EM | Admit: 2019-12-26 | Discharge: 2019-12-26 | Disposition: A | Discharge status: Home / Self Care | Payer: BC Managed Care – PPO | Attending: Emergency Medicine | Admitting: Emergency Medicine

## 2019-12-26 DIAGNOSIS — F17210 Nicotine dependence, cigarettes, uncomplicated: Secondary | ICD-10-CM | POA: Diagnosis not present

## 2019-12-26 DIAGNOSIS — J45909 Unspecified asthma, uncomplicated: Secondary | ICD-10-CM | POA: Insufficient documentation

## 2019-12-26 DIAGNOSIS — I10 Essential (primary) hypertension: Secondary | ICD-10-CM | POA: Diagnosis not present

## 2019-12-26 DIAGNOSIS — Z794 Long term (current) use of insulin: Secondary | ICD-10-CM | POA: Diagnosis not present

## 2019-12-26 DIAGNOSIS — E119 Type 2 diabetes mellitus without complications: Secondary | ICD-10-CM | POA: Insufficient documentation

## 2019-12-26 DIAGNOSIS — J069 Acute upper respiratory infection, unspecified: Secondary | ICD-10-CM | POA: Diagnosis not present

## 2019-12-26 DIAGNOSIS — Z79899 Other long term (current) drug therapy: Secondary | ICD-10-CM | POA: Diagnosis not present

## 2019-12-26 DIAGNOSIS — S161XXA Strain of muscle, fascia and tendon at neck level, initial encounter: Secondary | ICD-10-CM | POA: Diagnosis not present

## 2019-12-26 DIAGNOSIS — M542 Cervicalgia: Secondary | ICD-10-CM | POA: Diagnosis present

## 2019-12-26 HISTORY — DX: Type 2 diabetes mellitus without complications: E11.9

## 2019-12-26 MED ORDER — IBUPROFEN 600 MG PO TABS
600.0000 mg | ORAL_TABLET | Freq: Four times a day (QID) | ORAL | 0 refills | Status: DC | PRN
Start: 2019-12-26 — End: 2020-05-30

## 2019-12-26 MED ORDER — METHOCARBAMOL 500 MG PO TABS
500.0000 mg | ORAL_TABLET | Freq: Two times a day (BID) | ORAL | 0 refills | Status: DC | PRN
Start: 2019-12-26 — End: 2020-05-30
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
## 2020-03-29 DIAGNOSIS — I639 Cerebral infarction, unspecified: Secondary | ICD-10-CM

## 2020-03-29 HISTORY — DX: Cerebral infarction, unspecified: I63.9

## 2020-05-27 ENCOUNTER — Inpatient Hospital Stay (HOSPITAL_COMMUNITY): Payer: BC Managed Care – PPO | Admitting: Certified Registered"

## 2020-05-27 ENCOUNTER — Inpatient Hospital Stay (HOSPITAL_COMMUNITY)
Admission: EM | Admit: 2020-05-27 | Discharge: 2020-05-30 | DRG: 023 | Disposition: A | Discharge status: Home / Self Care | Payer: BC Managed Care – PPO | Attending: Neurology | Admitting: Neurology

## 2020-05-27 ENCOUNTER — Emergency Department (HOSPITAL_COMMUNITY): Payer: BC Managed Care – PPO

## 2020-05-27 ENCOUNTER — Encounter (HOSPITAL_COMMUNITY)
Admission: EM | Disposition: A | Discharge status: Home / Self Care | Payer: Self-pay | Source: Home / Self Care | Attending: Neurology

## 2020-05-27 ENCOUNTER — Inpatient Hospital Stay (HOSPITAL_COMMUNITY): Payer: BC Managed Care – PPO

## 2020-05-27 ENCOUNTER — Other Ambulatory Visit: Payer: Self-pay

## 2020-05-27 ENCOUNTER — Encounter (HOSPITAL_COMMUNITY): Payer: Self-pay | Admitting: Emergency Medicine

## 2020-05-27 DIAGNOSIS — Z91018 Allergy to other foods: Secondary | ICD-10-CM

## 2020-05-27 DIAGNOSIS — E1165 Type 2 diabetes mellitus with hyperglycemia: Secondary | ICD-10-CM | POA: Diagnosis present

## 2020-05-27 DIAGNOSIS — Z880 Allergy status to penicillin: Secondary | ICD-10-CM

## 2020-05-27 DIAGNOSIS — I10 Essential (primary) hypertension: Secondary | ICD-10-CM | POA: Diagnosis present

## 2020-05-27 DIAGNOSIS — G8194 Hemiplegia, unspecified affecting left nondominant side: Secondary | ICD-10-CM | POA: Diagnosis present

## 2020-05-27 DIAGNOSIS — Z7951 Long term (current) use of inhaled steroids: Secondary | ICD-10-CM

## 2020-05-27 DIAGNOSIS — R29810 Facial weakness: Secondary | ICD-10-CM | POA: Diagnosis present

## 2020-05-27 DIAGNOSIS — G4733 Obstructive sleep apnea (adult) (pediatric): Secondary | ICD-10-CM | POA: Diagnosis present

## 2020-05-27 DIAGNOSIS — Z20822 Contact with and (suspected) exposure to covid-19: Secondary | ICD-10-CM | POA: Diagnosis present

## 2020-05-27 DIAGNOSIS — F17210 Nicotine dependence, cigarettes, uncomplicated: Secondary | ICD-10-CM | POA: Diagnosis present

## 2020-05-27 DIAGNOSIS — R4781 Slurred speech: Secondary | ICD-10-CM | POA: Diagnosis present

## 2020-05-27 DIAGNOSIS — I639 Cerebral infarction, unspecified: Principal | ICD-10-CM

## 2020-05-27 DIAGNOSIS — Z79899 Other long term (current) drug therapy: Secondary | ICD-10-CM | POA: Diagnosis not present

## 2020-05-27 DIAGNOSIS — Z7984 Long term (current) use of oral hypoglycemic drugs: Secondary | ICD-10-CM

## 2020-05-27 DIAGNOSIS — I6389 Other cerebral infarction: Secondary | ICD-10-CM | POA: Diagnosis not present

## 2020-05-27 DIAGNOSIS — Z6841 Body Mass Index (BMI) 40.0 and over, adult: Secondary | ICD-10-CM

## 2020-05-27 DIAGNOSIS — Z9851 Tubal ligation status: Secondary | ICD-10-CM | POA: Diagnosis not present

## 2020-05-27 DIAGNOSIS — I7774 Dissection of vertebral artery: Secondary | ICD-10-CM | POA: Diagnosis present

## 2020-05-27 DIAGNOSIS — E785 Hyperlipidemia, unspecified: Secondary | ICD-10-CM | POA: Diagnosis present

## 2020-05-27 DIAGNOSIS — E871 Hypo-osmolality and hyponatremia: Secondary | ICD-10-CM | POA: Diagnosis present

## 2020-05-27 DIAGNOSIS — R29706 NIHSS score 6: Secondary | ICD-10-CM | POA: Diagnosis present

## 2020-05-27 DIAGNOSIS — Z888 Allergy status to other drugs, medicaments and biological substances status: Secondary | ICD-10-CM

## 2020-05-27 DIAGNOSIS — I6601 Occlusion and stenosis of right middle cerebral artery: Secondary | ICD-10-CM | POA: Diagnosis present

## 2020-05-27 DIAGNOSIS — Z794 Long term (current) use of insulin: Secondary | ICD-10-CM | POA: Diagnosis not present

## 2020-05-27 DIAGNOSIS — J45909 Unspecified asthma, uncomplicated: Secondary | ICD-10-CM | POA: Diagnosis present

## 2020-05-27 DIAGNOSIS — I63511 Cerebral infarction due to unspecified occlusion or stenosis of right middle cerebral artery: Secondary | ICD-10-CM | POA: Diagnosis present

## 2020-05-27 DIAGNOSIS — Z91010 Allergy to peanuts: Secondary | ICD-10-CM | POA: Diagnosis not present

## 2020-05-27 HISTORY — PX: RADIOLOGY WITH ANESTHESIA: SHX6223

## 2020-05-27 HISTORY — PX: IR CT HEAD LTD: IMG2386

## 2020-05-27 HISTORY — DX: Cerebral infarction, unspecified: I63.9

## 2020-05-27 HISTORY — PX: IR PERCUTANEOUS ART THROMBECTOMY/INFUSION INTRACRANIAL INC DIAG ANGIO: IMG6087

## 2020-05-27 HISTORY — DX: Occlusion and stenosis of right middle cerebral artery: I66.01

## 2020-05-27 LAB — COMPREHENSIVE METABOLIC PANEL
ALT: 24 U/L (ref 0–44)
AST: 20 U/L (ref 15–41)
Albumin: 3.6 g/dL (ref 3.5–5.0)
Alkaline Phosphatase: 55 U/L (ref 38–126)
Anion gap: 13 (ref 5–15)
BUN: 12 mg/dL (ref 6–20)
CO2: 21 mmol/L — ABNORMAL LOW (ref 22–32)
Calcium: 8.4 mg/dL — ABNORMAL LOW (ref 8.9–10.3)
Chloride: 95 mmol/L — ABNORMAL LOW (ref 98–111)
Creatinine, Ser: 0.93 mg/dL (ref 0.44–1.00)
GFR, Estimated: >60 mL/min (ref >60)
Glucose, Bld: 382 mg/dL — ABNORMAL HIGH (ref 70–99)
Potassium: 4 mmol/L (ref 3.5–5.1)
Sodium: 129 mmol/L — ABNORMAL LOW (ref 135–145)
Total Bilirubin: 0.6 mg/dL (ref 0.3–1.2)
Total Protein: 7 g/dL (ref 6.5–8.1)

## 2020-05-27 LAB — I-STAT BETA HCG BLOOD, ED (MC, WL, AP ONLY): I-stat hCG, quantitative: <5 m[IU]/mL (ref <5)

## 2020-05-27 LAB — CBC
HCT: 42.7 % (ref 36.0–46.0)
Hemoglobin: 14.5 g/dL (ref 12.0–15.0)
MCH: 31.3 pg (ref 26.0–34.0)
MCHC: 34 g/dL (ref 30.0–36.0)
MCV: 92 fL (ref 80.0–100.0)
Platelets: 359 10*3/uL (ref 150–400)
RBC: 4.64 MIL/uL (ref 3.87–5.11)
RDW: 11.9 % (ref 11.5–15.5)
WBC: 11.4 10*3/uL — ABNORMAL HIGH (ref 4.0–10.5)
nRBC: 0 % (ref 0.0–0.2)

## 2020-05-27 LAB — BASIC METABOLIC PANEL
Anion gap: 14 (ref 5–15)
BUN: 8 mg/dL (ref 6–20)
CO2: 19 mmol/L — ABNORMAL LOW (ref 22–32)
Calcium: 8.8 mg/dL — ABNORMAL LOW (ref 8.9–10.3)
Chloride: 100 mmol/L (ref 98–111)
Creatinine, Ser: 0.93 mg/dL (ref 0.44–1.00)
GFR, Estimated: >60 mL/min (ref >60)
Glucose, Bld: 300 mg/dL — ABNORMAL HIGH (ref 70–99)
Potassium: 3.8 mmol/L (ref 3.5–5.1)
Sodium: 133 mmol/L — ABNORMAL LOW (ref 135–145)

## 2020-05-27 LAB — RESP PANEL BY RT-PCR (FLU A&B, COVID) ARPGX2
Influenza A by PCR: NEGATIVE
Influenza B by PCR: NEGATIVE
SARS Coronavirus 2 by RT PCR: NEGATIVE

## 2020-05-27 LAB — DIFFERENTIAL
Abs Immature Granulocytes: 0.04 10*3/uL (ref 0.00–0.07)
Basophils Absolute: 0.1 10*3/uL (ref 0.0–0.1)
Basophils Relative: 0 %
Eosinophils Absolute: 0.1 10*3/uL (ref 0.0–0.5)
Eosinophils Relative: 1 %
Immature Granulocytes: 0 %
Lymphocytes Relative: 22 %
Lymphs Abs: 2.6 10*3/uL (ref 0.7–4.0)
Monocytes Absolute: 0.5 10*3/uL (ref 0.1–1.0)
Monocytes Relative: 4 %
Neutro Abs: 8.2 10*3/uL — ABNORMAL HIGH (ref 1.7–7.7)
Neutrophils Relative %: 73 %

## 2020-05-27 LAB — PROTIME-INR
INR: 1.1 (ref 0.8–1.2)
Prothrombin Time: 14.2 seconds (ref 11.4–15.2)

## 2020-05-27 LAB — I-STAT CHEM 8, ED
BUN: 20 mg/dL (ref 6–20)
Calcium, Ion: 1.15 mmol/L (ref 1.15–1.40)
Chloride: 93 mmol/L — ABNORMAL LOW (ref 98–111)
Creatinine, Ser: 0.8 mg/dL (ref 0.44–1.00)
Glucose, Bld: 322 mg/dL — ABNORMAL HIGH (ref 70–99)
HCT: 47 % — ABNORMAL HIGH (ref 36.0–46.0)
Hemoglobin: 16 g/dL — ABNORMAL HIGH (ref 12.0–15.0)
Potassium: 3.7 mmol/L (ref 3.5–5.1)
Sodium: 134 mmol/L — ABNORMAL LOW (ref 135–145)
TCO2: 30 mmol/L (ref 22–32)

## 2020-05-27 LAB — GLUCOSE, CAPILLARY
Glucose-Capillary: 266 mg/dL — ABNORMAL HIGH (ref 70–99)
Glucose-Capillary: 382 mg/dL — ABNORMAL HIGH (ref 70–99)
Glucose-Capillary: 386 mg/dL — ABNORMAL HIGH (ref 70–99)

## 2020-05-27 LAB — ETHANOL: Alcohol, Ethyl (B): <10 mg/dL (ref <10)

## 2020-05-27 LAB — CBG MONITORING, ED: Glucose-Capillary: 293 mg/dL — ABNORMAL HIGH (ref 70–99)

## 2020-05-27 LAB — APTT: aPTT: 25 seconds (ref 24–36)

## 2020-05-27 LAB — HIV ANTIBODY (ROUTINE TESTING W REFLEX): HIV Screen 4th Generation wRfx: NONREACTIVE

## 2020-05-27 LAB — MRSA PCR SCREENING: MRSA by PCR: NEGATIVE

## 2020-05-27 IMAGING — XA IR PERCUTANEOUS ART THORMBECTOMY/INFUSION INTRACRANIAL INCLUDE D
1 series · 10 of 24 positions shown · IV contrast (IODINE)
Comparison: CT angiogram of the head and neck [DATE].

INDICATION: New onset left-sided weakness with paresthesias and left facial
droop. Occluded right middle cerebral artery M1 segment on CT
angiogram of the head and neck.

EXAM:
1. EMERGENT LARGE VESSEL OCCLUSION THROMBOLYSIS (anterior
CIRCULATION)
TECHNIQUE: Following a full explanation of the procedure along with the
potential associated complications, an informed witnessed consent
was obtained. The risks of intracranial hemorrhage of 10%, worsening
neurological deficit, ventilator dependency, death and inability to
revascularize were all reviewed in detail with the patient's
husband.

[Series 300: dr. (person_name) · 10 of 243 slices shown]
[im 11/243]
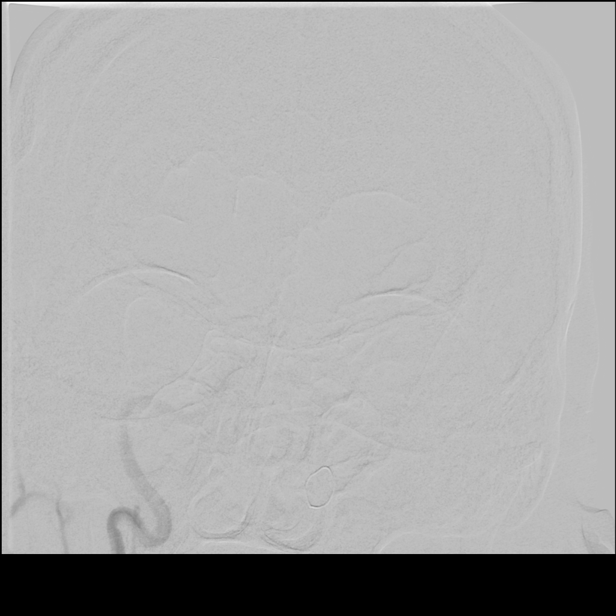
[im 32/243]
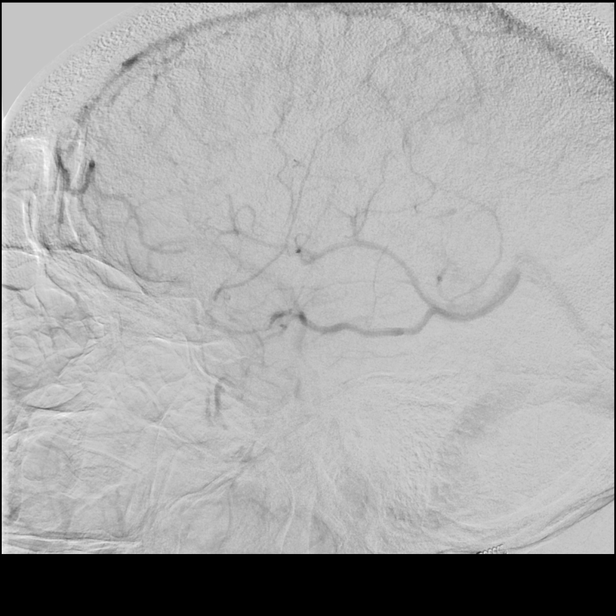
[im 64/243]
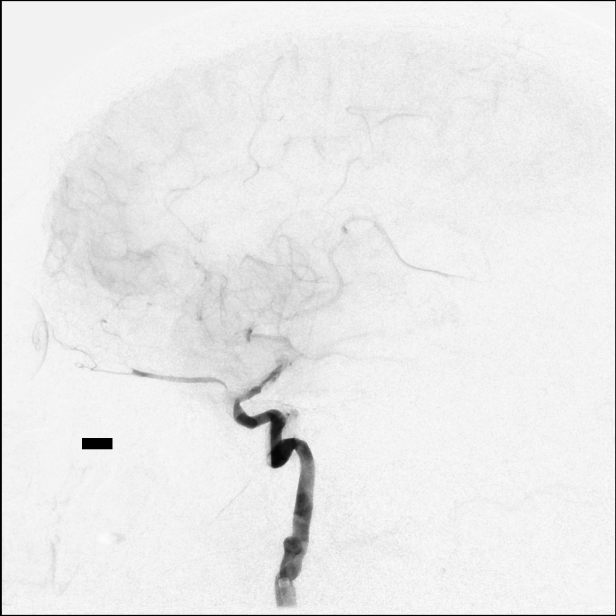
[im 85/243]
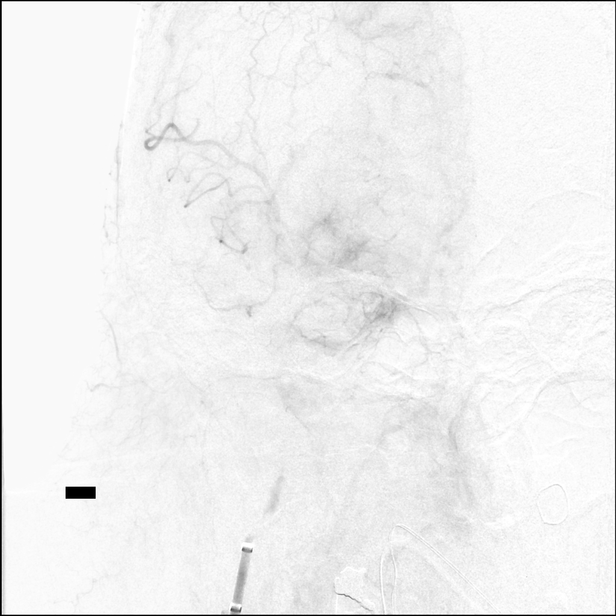
[im 106/243]
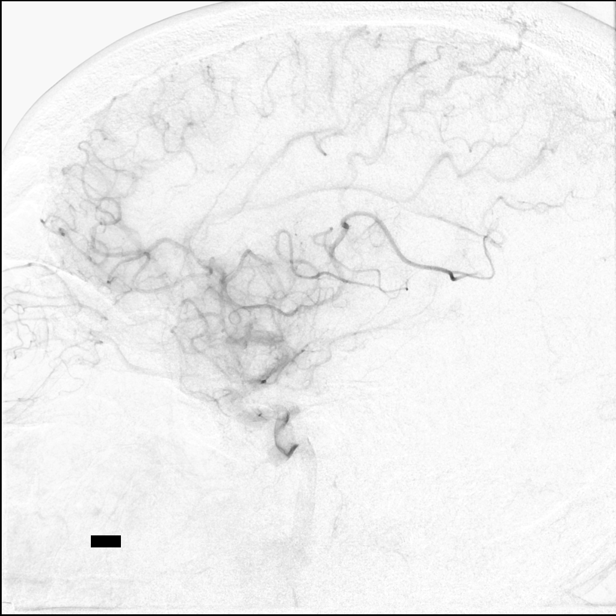
[im 137/243]
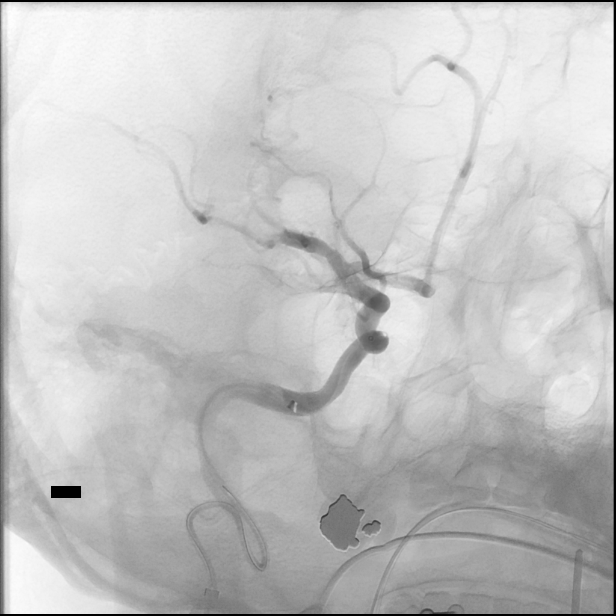
[im 158/243]
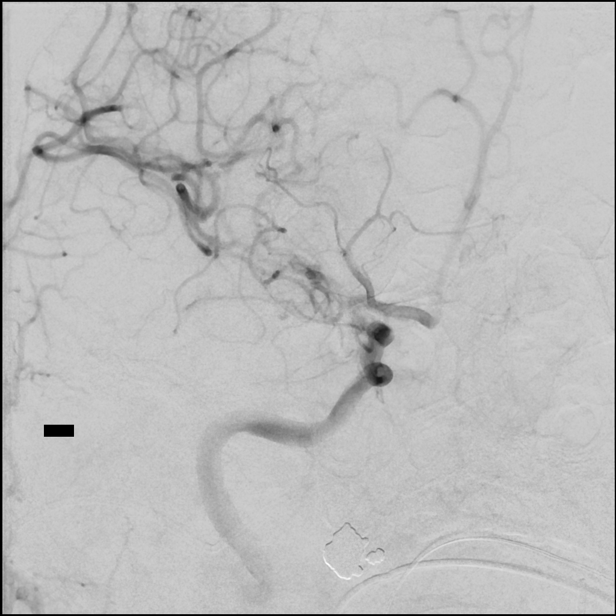
[im 190/243]
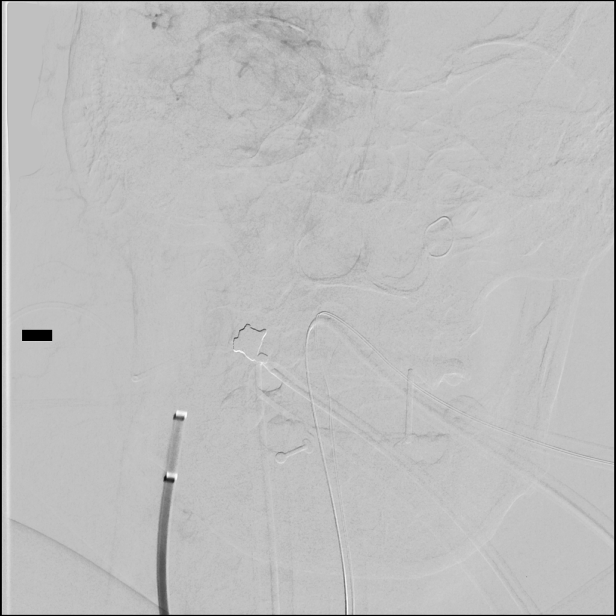
[im 211/243]
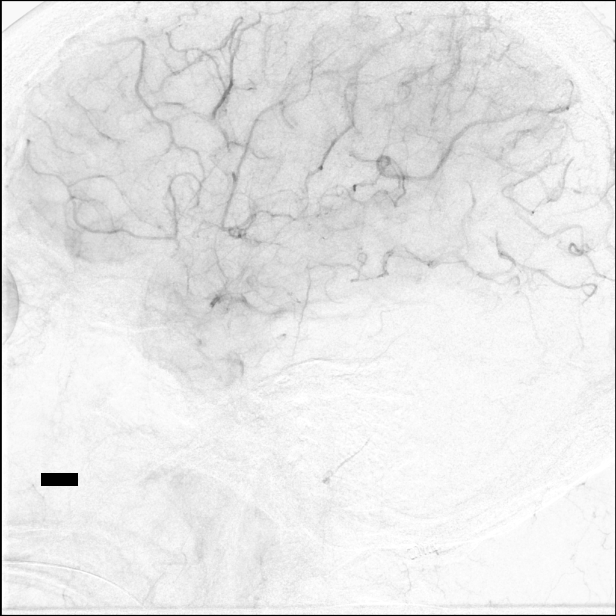
[im 232/243]
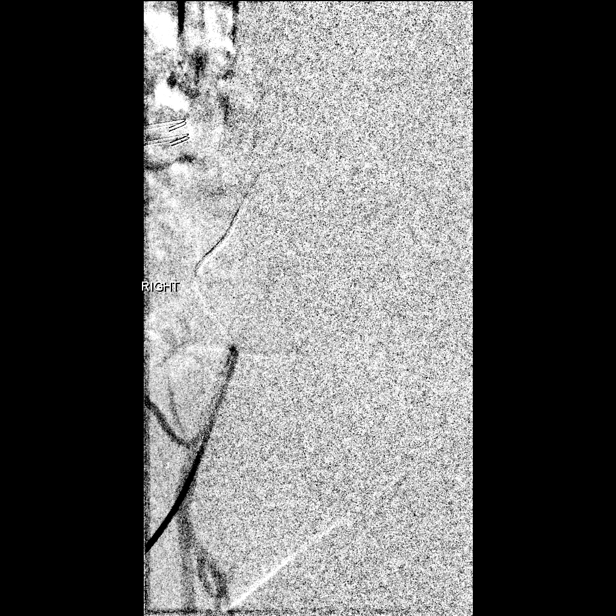

[10 of 24 positions shown; findings below may reference images not displayed]

MEDICATIONS:
Ancef 2 g IV antibiotic was administered within 1 hour of the
procedure.

ANESTHESIA/SEDATION:
General anesthesia.

CONTRAST:  Isovue 300 approximately 150 mL.

FLUOROSCOPY TIME:  Fluoroscopy Time: 40 minutes 50 seconds ([7M]
mGy).

COMPLICATIONS:
None immediate.
The patient was then put under general anesthesia by the [REDACTED] at [HOSPITAL].

The right groin was prepped and draped in the usual sterile fashion.
Thereafter using modified Seldinger technique, transfemoral access
into the right common femoral artery was obtained without
difficulty. Over a 0.035 inch guidewire an 8 French 25 cm Pinnacle
sheath was inserted. Through this, and also over a 0.035 inch
guidewire a 5 French JB 1 catheter was advanced to the aortic arch
region and selectively positioned in the innominate artery and the
right common carotid artery.
FINDINGS: An innominate arteriogram demonstrates the right subclavian artery
and the right common carotid artery proximal to be widely patent.

The origin the right vertebral artery is widely patent.

The vessel is seen to opacify to the cranial skull base.
Opacification is seen of the right vertebrobasilar junction and the
basilar artery and the superior cerebellar arteries on the lateral
projection mixed with with unopacified blood from the contralateral
left vertebral artery.

The right common carotid arteriogram demonstrates the right external
carotid artery and its major branches to be widely patent.

The right internal carotid artery at the bulb demonstrates wide
patency. More distally there is a double U shaped configuration of
the mid [DATE] of the right internal carotid artery. More distally the
vessel is seen to opacify to the petrous, cavernous and the
supraclinoid segments to be widely patent.

The right middle cerebral artery demonstrates complete occlusion in
the distal [DATE]. The right anterior cerebral artery opacifies into
the capillary and venous phases.

PROCEDURE:
The diagnostic JB 1 catheter in the right common carotid artery was
then exchanged over a 0.035 inch 300 cm Rosen exchange guidewire for
an 087 balloon guide catheter which had been prepped with 50%
contrast and 50% heparinized saline infusion.

The guidewire was removed. Good aspiration obtained from the hub of
the balloon guide catheter.

Over a 0.014 inch standard Synchro micro guidewire with a J
configuration, an 021 160 cm microcatheter with an 071 136 cm Zoom
aspiration combination was advanced to the supraclinoid right ICA.

The balloon guide was now advanced into the mid third of the right
ICA.

The micro guidewire was then advanced using a torque device through
the occluded right middle cerebral artery inferior division M2 M3
region followed by the microcatheter. The guidewire was removed.
Good aspiration obtained from the hub of the microcatheter. A gentle
control arteriogram performed through the microcatheter demonstrated
safe position of the tip of the microcatheter.

A 4 mm x 40 mm Solitaire X retrieval device was advanced to the
distal end of the microcatheter. The O ring on the microcatheter was
loosened. With slight forward gentle traction with the right hand on
the delivery micro guidewire with the left hand the microcatheter
was unsheathed deploying the retrieval device.

With proximal flow arrest in the right internal carotid artery, and
constant aspiration at the hub of the Zoom aspiration catheter, and
with a 20 mL syringe at the hub of the balloon guide catheter, the
combination of the retrieval device, the microcatheter, and the 071
aspiration catheter was retrieved and removed.

Following flow arrest reversal, a control arteriogram performed
through the balloon guide catheter in the right internal carotid
artery demonstrated revascularization of the inferior division, and
right M1 segment achieving a TICI 2b revascularization.

Significant spasm was seen in the inferior division, and also the
right internal carotid artery.

The spasm responded to 3 aliquots of 25 mcg of nitroglycerin and
mg of verapamil given intra-arterially.

Following reversal of a vasospasm, a control arteriogram performed
through the balloon guide catheter in the right internal carotid
artery demonstrated improved caliber of the right MCA trifurcation
region, and the right anterior cerebral artery. There continued to
be moderate spasm in the proximal inferior division.

The superior division remained occluded.

A second pass was then made, using a 55 136 cm Zoom aspiration
catheter inside of which was an 021 Trevo ProVue microcatheter over
a 0.014 inch Synchro standard micro guidewire.

The combination was again advanced to just proximal to the origin of
the superior division.

The microcatheter and micro guidewire was then advanced into the M2
M3 region of the superior division.

The micro guidewire was removed. Good aspiration obtained from the
hub of the microcatheter which was then connected to continuous
heparinized saline infusion.

A 4 mm x 40 mm Solitaire X retrieval device was again advanced to
the distal end of the microcatheter and deployed in the usual
manner.

The 55 Zoom aspiration catheter was now advanced just inside the
occluded superior division.

Again with proximal flow arrest in the right internal carotid
artery, and constant aspiration applied at the hub of the 55 Zoom
aspiration catheter with a Penumbra aspiration device, and a 20 mL
syringe at the hub of the balloon guide catheter, for approximately
2 minutes, the combination of the retrieval device, the aspiration
catheter and the microcatheter were retrieved and removed. Following
reversal of flow arrest, a control arteriogram performed through the
balloon guide catheter in the right internal carotid artery now
demonstrated complete revascularization of the inferior and superior
divisions of the right middle cerebral artery. Previously noted
spasm in the inferior divisions, and the superior divisions was
mostly resolved again with at least 2 aliquots of 25 mcg of
nitroglycerin.

Spasm in the right internal carotid artery also responded to the
nitroglycerin.

A final control arteriogram performed through the balloon guide
catheter in the right common carotid artery demonstrated
significantly improved caliber and flow through the internal carotid
artery extra cranially and intracranially. The right middle cerebral
artery and the right anterior cerebral artery continued to maintain
wide patency with improved caliber with vasospasm relaxation.

Balloon guide was removed. The 8 French Pinnacle sheath was removed
with successful hemostasis in the right groin puncture site with an
8 French Angio-Seal closure device. Distal pulses remained
Dopplerable in both feet unchanged.

A CT of the brain performed demonstrated only minimal
hyperattenuation in the right southern fissure.

No mass effect or midline shift was seen.

Patient was then extubated. Upon recovery, the patient followed
simple commands appropriately and was able to raise her left arm and
left leg against gravity.

She was then returned to the neuro ICU for post revascularization
management.
IMPRESSION: Status post endovascular complete revascularization of occluded
right middle cerebral M1 segment, with 2 passes with a Solitaire X 4
mm x 40 mm retrieval device, and contact aspiration achieving a TICI
3 revascularization.

PLAN:
Follow-up in the clinic approximately 4 weeks post discharge.

## 2020-05-27 IMAGING — CT CT ANGIO HEAD-NECK (W OR W/O PERF)
2 of 7 series · 8 of 33 positions shown · IV contrast (APPLIED)
Comparison: Head CT from earlier the same day

CLINICAL DATA: Acute stroke suspected.

EXAM:
CT ANGIOGRAPHY HEAD AND NECK
TECHNIQUE: Multidetector CT imaging of the head and neck was performed using
the standard protocol during bolus administration of intravenous
contrast. Multiplanar CT image reconstructions and MIPs were
obtained to evaluate the vascular anatomy. Carotid stenosis
measurements (when applicable) are obtained utilizing NASCET
criteria, using the distal internal carotid diameter as the
denominator.
CONTRAST:  Dose is currently not known

[Series 5: cta neck/head · axial · 0.49mm/px · z∈[+894,+1010]mm · 2 of 176 slices shown]
[im 59/176  soft-tissue]
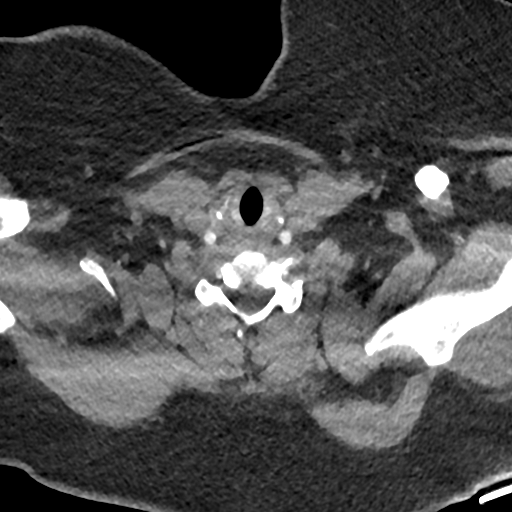
[im 117/176  soft-tissue]
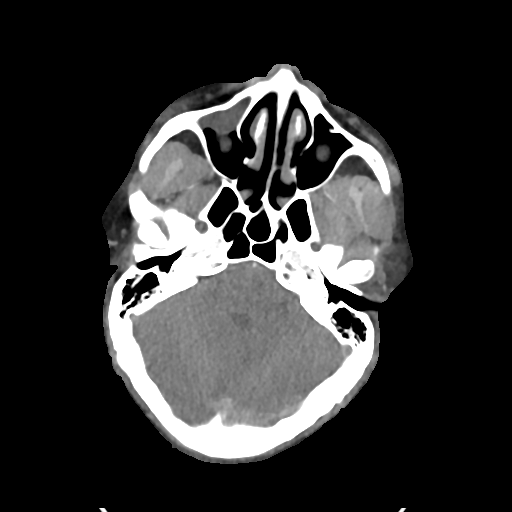

[Series 7: ax thins · axial · 0.39mm/px · z∈[+828,+1078]mm · 6 of 352 slices shown]
[im 51/352  soft-tissue]
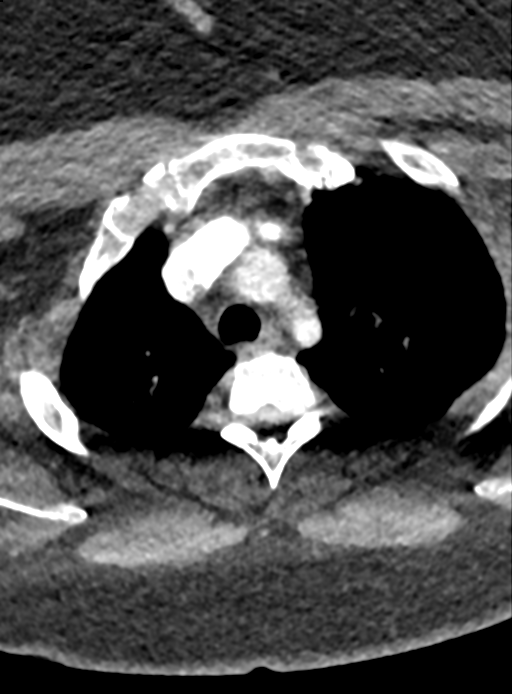
[im 101/352  bone]
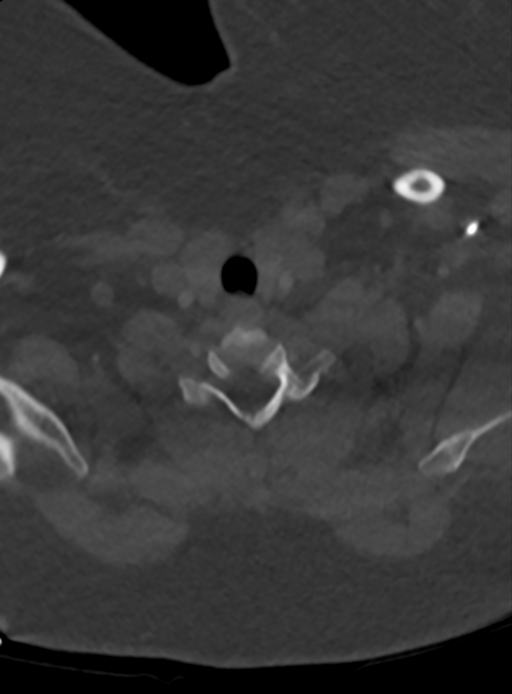
[im 151/352  soft-tissue]
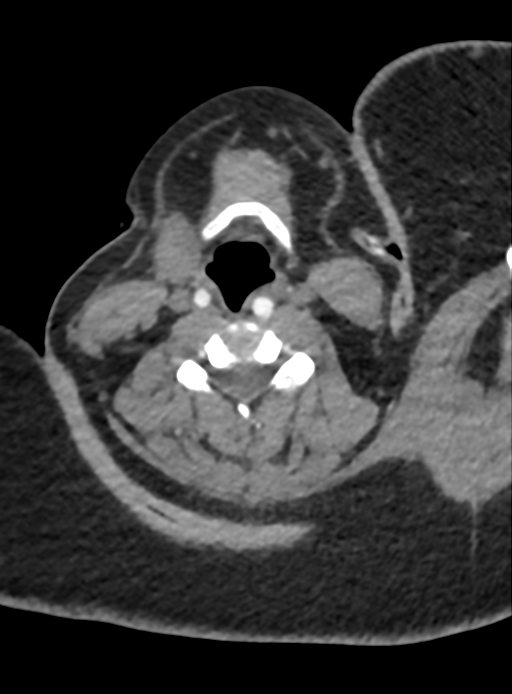
[im 201/352  bone]
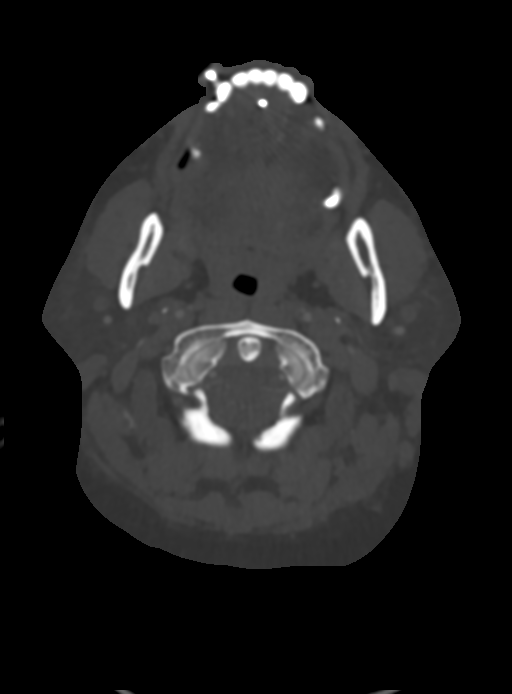
[im 251/352  soft-tissue]
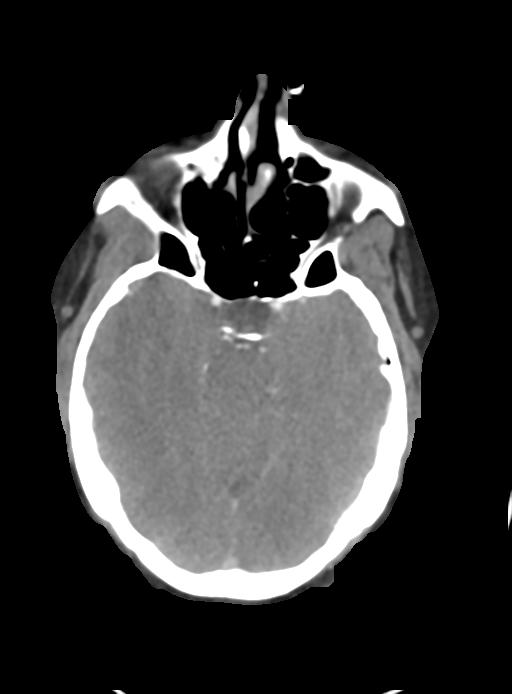
[im 301/352  bone]
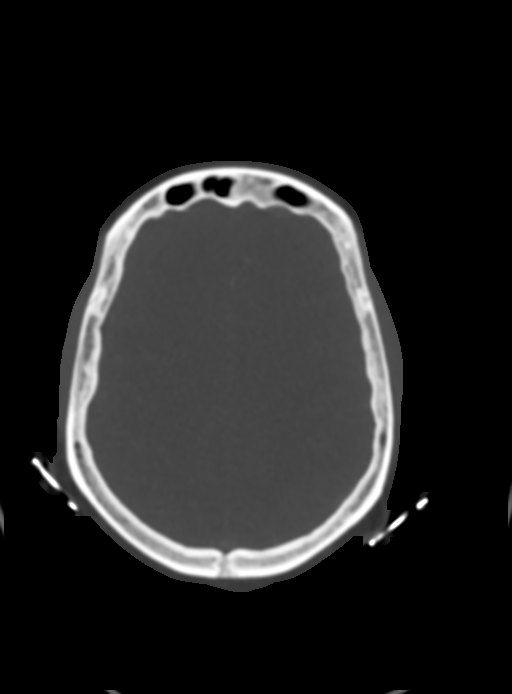

[8 of 33 positions shown; findings below may reference images not displayed]

FINDINGS: CTA NECK FINDINGS

Aortic arch: Unremarkable

Right carotid system: Vessels appear smooth and widely patent. No
noted dissection, ulceration, or generalized beading. Tortuous ICA.

Left carotid system: Tortuous ICA and partial retropharyngeal
course. No evidence of stenosis, beading, or aneurysm.

Vertebral arteries: No proximal subclavian stenosis. Both vertebral
arteries are patent to the dura. Mild but fairly smoothly contoured
narrowing of the left vertebral artery at the C2 level.

Skeleton: No acute finding

Other neck: No visible soft tissue mass or inflammation.

Upper chest: Negative

Review of the MIP images confirms the above findings

CTA HEAD FINDINGS

Anterior circulation: Limited by small vessels and soft tissue
attenuation. Right M1 occlusion with faint downstream
reconstitution. Pial collaterals are enhancing. No additional
anterior circulation embolism is seen. No evidence of chronic
stenosis.

Posterior circulation: Small appearance of the left V4 segment but
fairly smoothly contoured as permitted by technique and CTA quality.
No branch occlusion is seen.

Venous sinuses: Patent

Anatomic variants: None significant

Review of the MIP images confirms the above findings

Critical Value/emergent results were called by telephone at the time
of interpretation on [DATE] at [DATE] to provider ALEKSANDAR MUHA
ALEKSANDAR MUHA , who verbally acknowledged these results.
IMPRESSION: 1. Right M1 occlusion. No detected embolic source in the neck,
although suboptimal CTA quality due to motion and soft tissue
attenuation.
2. Narrowing of the left vertebral artery at the level of C2 and V4,
consider injection at catheter angiogram to exclude FMD or
dissection.

## 2020-05-27 IMAGING — CT CT HEAD CODE STROKE
4 series · 16 of 47 positions shown, 18 images · non-contrast
Comparison: Head CT [DATE]

CLINICAL DATA: Code stroke. Left-sided weakness and facial
drooping, sudden onset after sneezing

EXAM:
CT HEAD WITHOUT CONTRAST
TECHNIQUE: Contiguous axial images were obtained from the base of the skull
through the vertex without intravenous contrast.

[Series 2: head wo · axial · 0.47mm/px · z∈[+999,+1119]mm · 7 of 33 slices shown, 9 images]
[im 5/33  brain]
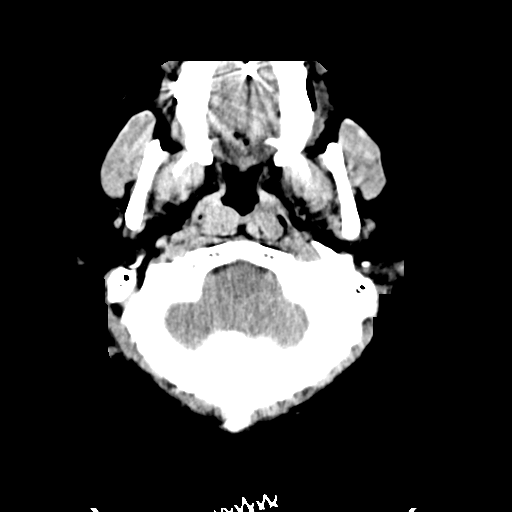
[im 5/33  bone]
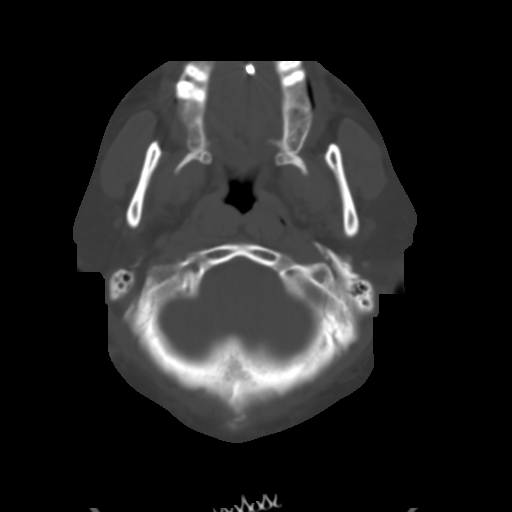
[im 9/33  brain]
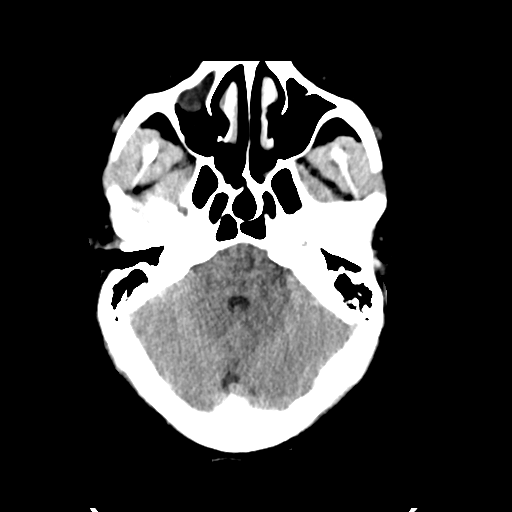
[im 13/33  brain]
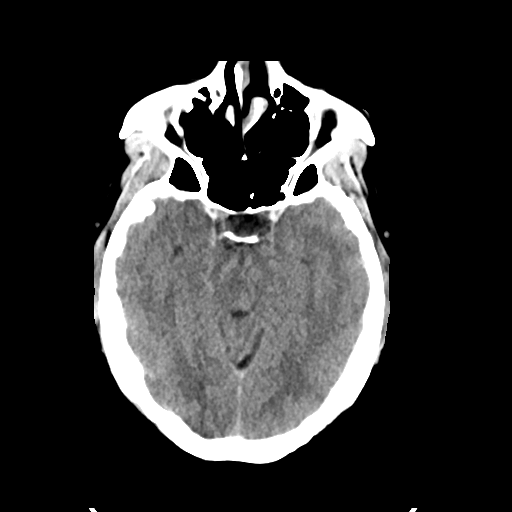
[im 17/33  brain]
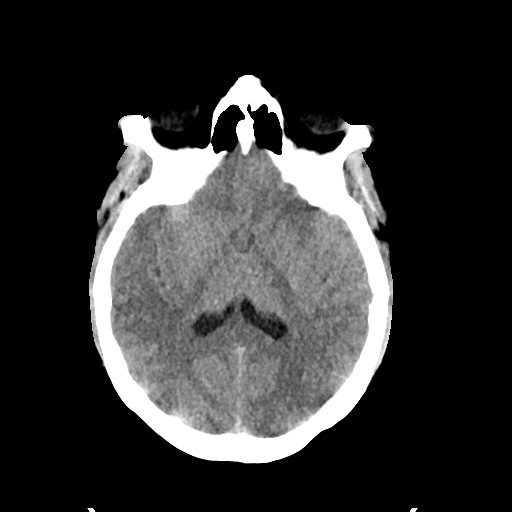
[im 21/33  brain]
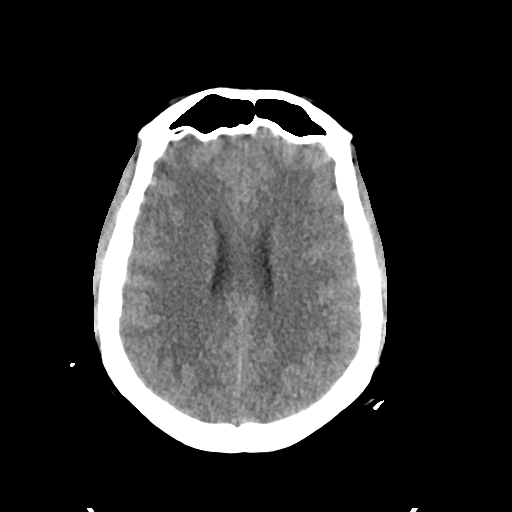
[im 21/33  bone]
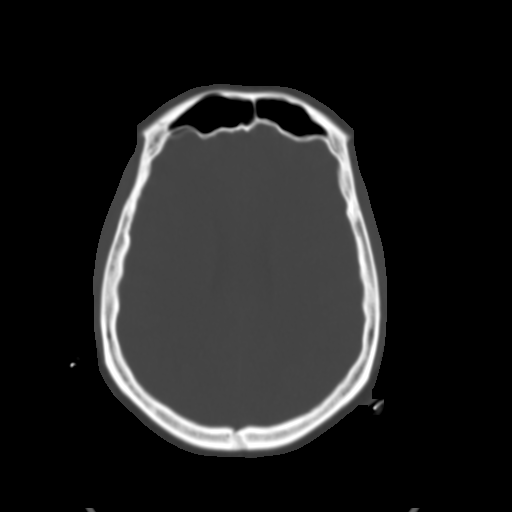
[im 25/33  brain]
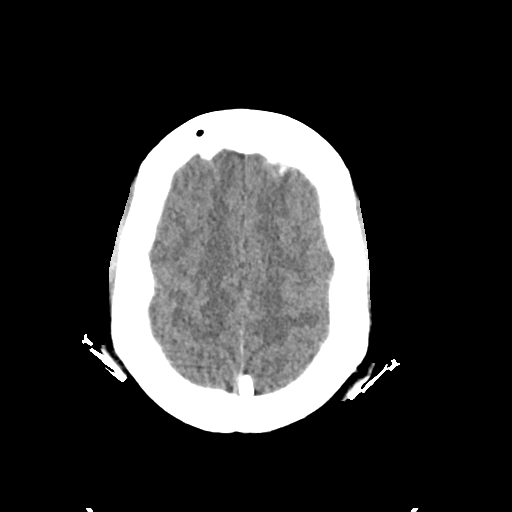
[im 29/33  brain]
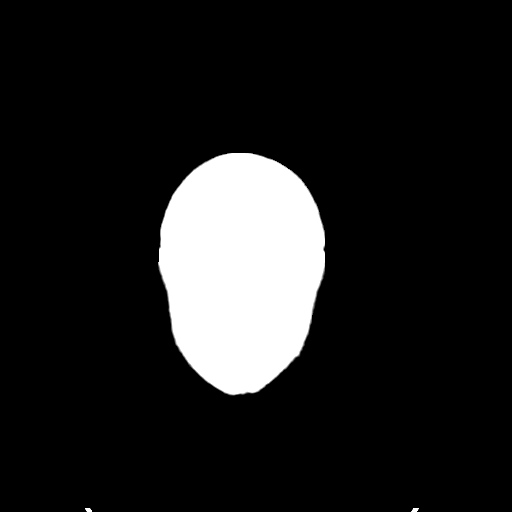

[Series 3: head bone · axial · 0.47mm/px · z∈[+995,+1027]mm · 3 of 82 slices shown]
[im 9/82  bone]
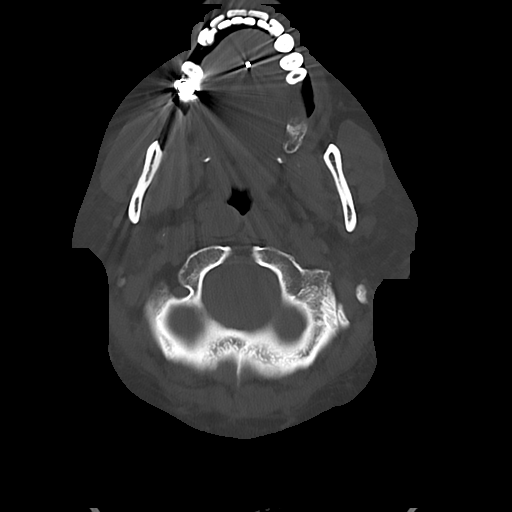
[im 17/82  bone]
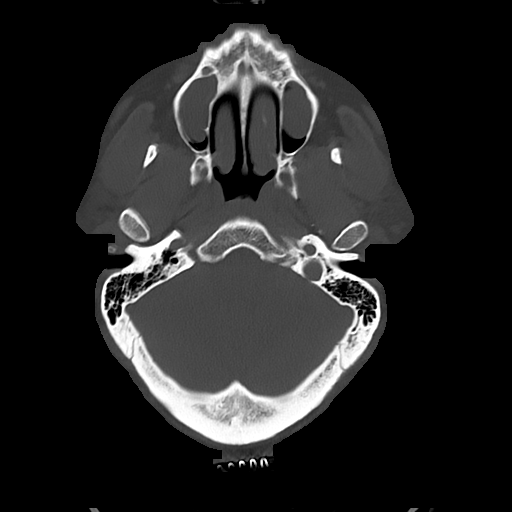
[im 25/82  bone]
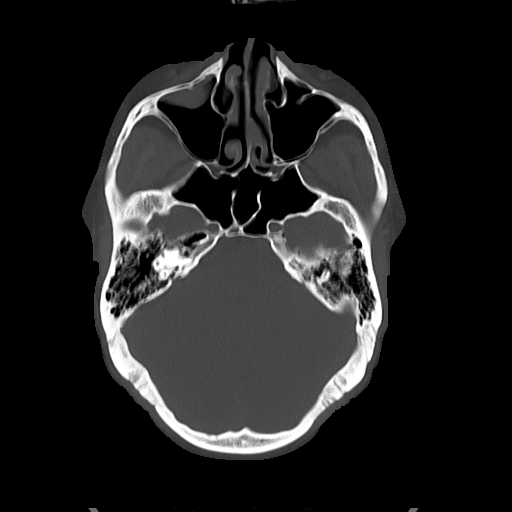

[Series 4: cor soft · coronal · 0.34mm/px · 3 of 72 slices shown]
[im 24/72  brain]
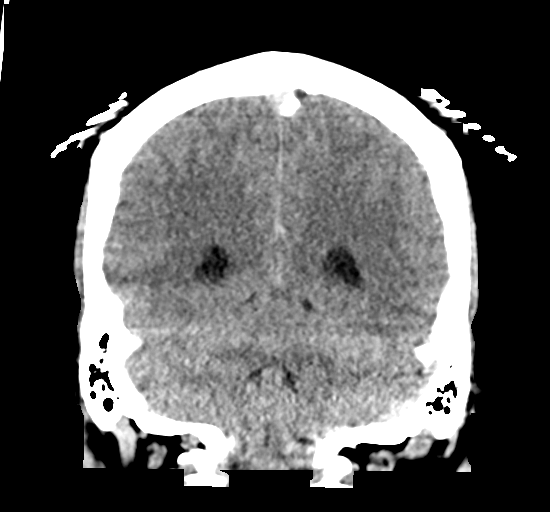
[im 32/72  brain]
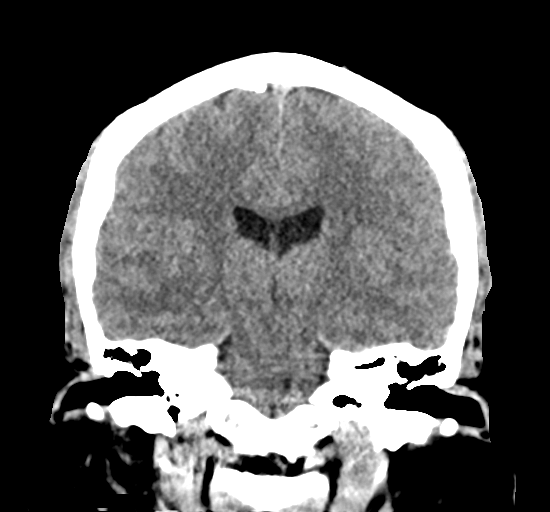
[im 40/72  brain]
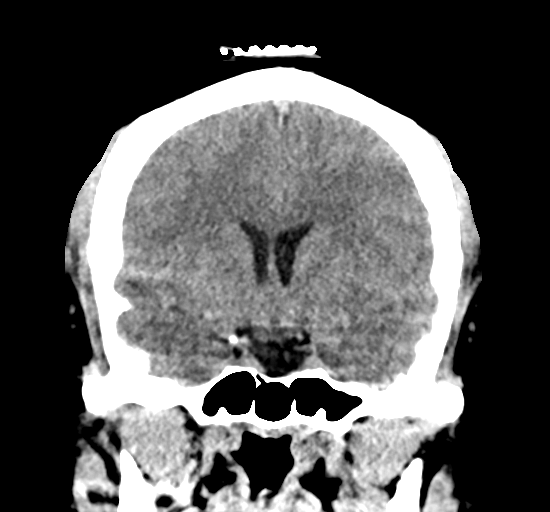

[Series 6: sag soft · sagittal · 0.37mm/px · 3 of 57 slices shown]
[im 19/57  brain]
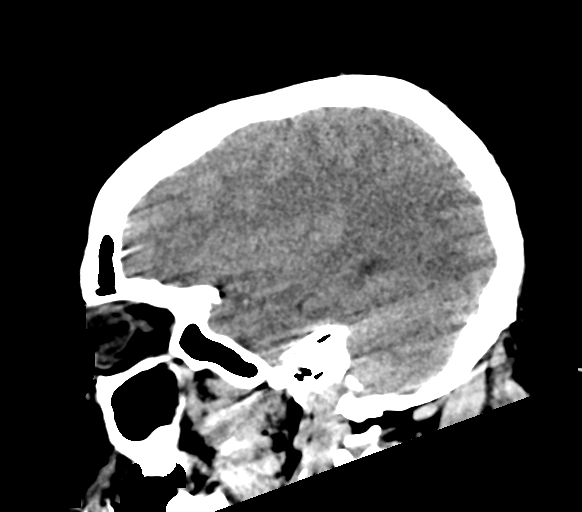
[im 29/57  brain]
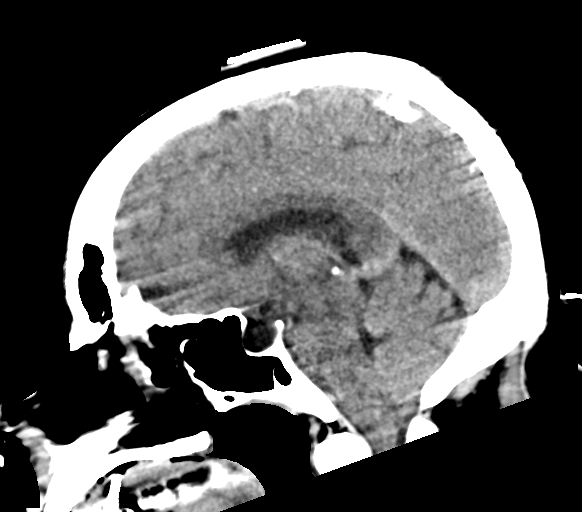
[im 38/57  brain]
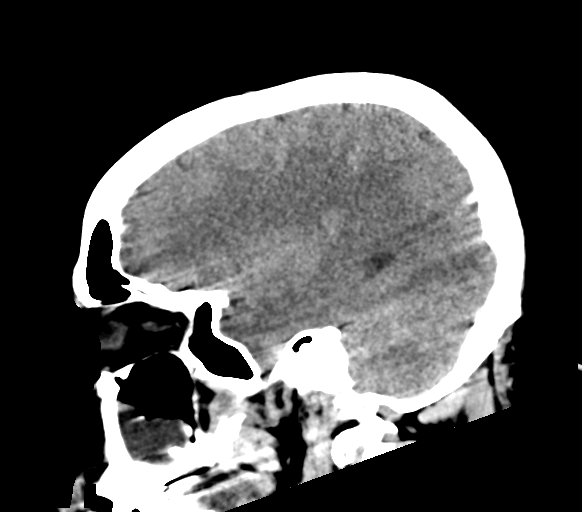

[16 of 47 positions shown; findings below may reference images not displayed]

FINDINGS: Brain: There is prominent degree of streak artifact bilaterally. No
evidence of acute infarction, hemorrhage, hydrocephalus, extra-axial
collection or mass lesion/mass effect. Low cerebellar tonsils, 5-6
mm below the foramen magnum, unchanged from prior. There is foramen
magnum crowding such that Chiari 1 malformation would be considered
in the appropriate clinical setting.

Vascular: No hyperdense vessel or unexpected calcification.

Skull: Negative

Sinuses/Orbits: Negative

Other: Critical Value/emergent results were called by telephone at
the time of interpretation on [DATE] at [DATE] to Dr OLIN,
who verbally acknowledged these results.

ASPECTS (Alberta Stroke Program Early CT Score)

- Ganglionic level infarction (caudate, lentiform nuclei, internal
capsule, insula, M1-M3 cortex): 7

- Supraganglionic infarction (M4-M6 cortex): 3

Total score (0-10 with 10 being normal): 10
IMPRESSION: 1. No acute finding.
2. Chronic low cerebellar tonsils with borderline foramen magnum
stenosis, Chiari 1 malformation would be considered in the
appropriate clinical setting.

## 2020-05-27 SURGERY — IR WITH ANESTHESIA
Anesthesia: General

## 2020-05-27 MED ORDER — SODIUM CHLORIDE 0.9 % IV SOLN: 50.0000 mL | Freq: Once | INTRAVENOUS | Status: DC

## 2020-05-27 MED ORDER — LABETALOL HCL 5 MG/ML IV SOLN: 10.0000 mg | INTRAVENOUS | Status: DC | PRN

## 2020-05-27 MED ORDER — ROCURONIUM BROMIDE 10 MG/ML (PF) SYRINGE
PREFILLED_SYRINGE | INTRAVENOUS | Status: DC | PRN
  Administered 2020-05-27: 60 mg via INTRAVENOUS
  Administered 2020-05-27: 10 mg via INTRAVENOUS
  Administered 2020-05-27: 30 mg via INTRAVENOUS

## 2020-05-27 MED ORDER — LABETALOL HCL 5 MG/ML IV SOLN
5.0000 mg | Freq: Once | INTRAVENOUS | Status: AC
  Administered 2020-05-27: 5 mg via INTRAVENOUS

## 2020-05-27 MED ORDER — FENTANYL CITRATE (PF) 100 MCG/2ML IJ SOLN
INTRAMUSCULAR | Status: AC
  Filled 2020-05-27: qty 2

## 2020-05-27 MED ORDER — VERAPAMIL HCL 2.5 MG/ML IV SOLN
INTRAVENOUS | Status: AC
  Administered 2020-05-27: 2.5 mg via INTRA_ARTERIAL
  Filled 2020-05-27: qty 2

## 2020-05-27 MED ORDER — IOHEXOL 300 MG/ML  SOLN
150.0000 mL | Freq: Once | INTRAMUSCULAR | Status: AC | PRN
  Administered 2020-05-27: 110 mL via INTRA_ARTERIAL

## 2020-05-27 MED ORDER — TIROFIBAN HCL IN NACL 5-0.9 MG/100ML-% IV SOLN
INTRAVENOUS | Status: AC
  Filled 2020-05-27: qty 100

## 2020-05-27 MED ORDER — FLUTICASONE FUROATE-VILANTEROL 100-25 MCG/INH IN AEPB
1.0000 | INHALATION_SPRAY | Freq: Every day | RESPIRATORY_TRACT | Status: DC
  Administered 2020-05-28 – 2020-05-30 (×2): 1 via RESPIRATORY_TRACT
  Filled 2020-05-27: qty 28

## 2020-05-27 MED ORDER — EPTIFIBATIDE 20 MG/10ML IV SOLN
INTRAVENOUS | Status: AC
  Filled 2020-05-27: qty 10

## 2020-05-27 MED ORDER — IOHEXOL 300 MG/ML  SOLN
50.0000 mL | Freq: Once | INTRAMUSCULAR | Status: AC | PRN
  Administered 2020-05-27: 40 mL via INTRA_ARTERIAL

## 2020-05-27 MED ORDER — SUGAMMADEX SODIUM 200 MG/2ML IV SOLN
INTRAVENOUS | Status: DC | PRN
  Administered 2020-05-27: 200 mg via INTRAVENOUS

## 2020-05-27 MED ORDER — VANCOMYCIN HCL 1000 MG IV SOLR
INTRAVENOUS | Status: DC | PRN
  Administered 2020-05-27: 1000 mg via INTRAVENOUS

## 2020-05-27 MED ORDER — INSULIN ASPART 100 UNIT/ML ~~LOC~~ SOLN
0.0000 [IU] | SUBCUTANEOUS | Status: DC
  Administered 2020-05-27 (×2): 20 [IU] via SUBCUTANEOUS
  Administered 2020-05-28 (×3): 11 [IU] via SUBCUTANEOUS
  Administered 2020-05-28: 7 [IU] via SUBCUTANEOUS

## 2020-05-27 MED ORDER — STROKE: EARLY STAGES OF RECOVERY BOOK: Freq: Once | Status: DC

## 2020-05-27 MED ORDER — METOPROLOL TARTRATE 5 MG/5ML IV SOLN
2.5000 mg | Freq: Four times a day (QID) | INTRAVENOUS | Status: DC
  Administered 2020-05-27 – 2020-05-28 (×3): 2.5 mg via INTRAVENOUS
  Filled 2020-05-27 (×4): qty 5

## 2020-05-27 MED ORDER — ACETAMINOPHEN 160 MG/5ML PO SOLN: 650.0000 mg | ORAL | Status: DC | PRN

## 2020-05-27 MED ORDER — SODIUM CHLORIDE 0.9 % IV SOLN: INTRAVENOUS | Status: DC

## 2020-05-27 MED ORDER — ALBUTEROL SULFATE HFA 108 (90 BASE) MCG/ACT IN AERS
2.0000 | INHALATION_SPRAY | Freq: Four times a day (QID) | RESPIRATORY_TRACT | Status: DC | PRN
  Filled 2020-05-27: qty 6.7

## 2020-05-27 MED ORDER — METOPROLOL TARTRATE 50 MG PO TABS: 100.0000 mg | ORAL_TABLET | Freq: Every day | ORAL | Status: DC

## 2020-05-27 MED ORDER — ONDANSETRON HCL 4 MG/2ML IJ SOLN
INTRAMUSCULAR | Status: DC | PRN
  Administered 2020-05-27: 4 mg via INTRAVENOUS

## 2020-05-27 MED ORDER — ACETAMINOPHEN 650 MG RE SUPP: 650.0000 mg | RECTAL | Status: DC | PRN

## 2020-05-27 MED ORDER — SODIUM CHLORIDE 0.9% FLUSH
3.0000 mL | Freq: Once | INTRAVENOUS | Status: DC
Start: 2020-05-27 — End: 2020-05-30

## 2020-05-27 MED ORDER — ACETAMINOPHEN 325 MG PO TABS: 650.0000 mg | ORAL_TABLET | ORAL | Status: DC | PRN

## 2020-05-27 MED ORDER — CLEVIDIPINE BUTYRATE 0.5 MG/ML IV EMUL
0.0000 mg/h | INTRAVENOUS | Status: AC
  Administered 2020-05-27: 19 mg/h via INTRAVENOUS
  Administered 2020-05-27: 25 mg/h via INTRAVENOUS
  Administered 2020-05-27: 21 mg/h via INTRAVENOUS
  Administered 2020-05-28: 23 mg/h via INTRAVENOUS
  Administered 2020-05-28: 13 mg/h via INTRAVENOUS
  Filled 2020-05-27 (×2): qty 50
  Filled 2020-05-27 (×2): qty 100
  Filled 2020-05-27 (×2): qty 50
  Filled 2020-05-27: qty 100

## 2020-05-27 MED ORDER — ORAL CARE MOUTH RINSE
15.0000 mL | Freq: Two times a day (BID) | OROMUCOSAL | Status: DC
  Administered 2020-05-27: 15 mL via OROMUCOSAL

## 2020-05-27 MED ORDER — CLOPIDOGREL BISULFATE 300 MG PO TABS
ORAL_TABLET | ORAL | Status: AC
  Filled 2020-05-27: qty 1

## 2020-05-27 MED ORDER — SUCCINYLCHOLINE CHLORIDE 200 MG/10ML IV SOSY
PREFILLED_SYRINGE | INTRAVENOUS | Status: DC | PRN
  Administered 2020-05-27: 140 mg via INTRAVENOUS

## 2020-05-27 MED ORDER — VANCOMYCIN HCL IN DEXTROSE 1-5 GM/200ML-% IV SOLN
INTRAVENOUS | Status: AC
  Filled 2020-05-27: qty 200

## 2020-05-27 MED ORDER — IOHEXOL 350 MG/ML SOLN
50.0000 mL | Freq: Once | INTRAVENOUS | Status: AC | PRN
  Administered 2020-05-27: 50 mL via INTRAVENOUS

## 2020-05-27 MED ORDER — CHLORHEXIDINE GLUCONATE CLOTH 2 % EX PADS
6.0000 | MEDICATED_PAD | Freq: Every day | CUTANEOUS | Status: DC
  Administered 2020-05-27 – 2020-05-30 (×4): 6 via TOPICAL

## 2020-05-27 MED ORDER — ROSUVASTATIN CALCIUM 20 MG PO TABS
40.0000 mg | ORAL_TABLET | Freq: Every day | ORAL | Status: DC
  Administered 2020-05-27 – 2020-05-30 (×4): 40 mg via ORAL
  Filled 2020-05-27 (×4): qty 2

## 2020-05-27 MED ORDER — ALTEPLASE (STROKE) FULL DOSE INFUSION
90.0000 mg | Freq: Once | INTRAVENOUS | Status: AC
  Administered 2020-05-27: 90 mg via INTRAVENOUS
  Filled 2020-05-27: qty 100

## 2020-05-27 MED ORDER — SODIUM CHLORIDE 0.9 % IV BOLUS: 1000.0000 mL | Freq: Once | INTRAVENOUS | Status: DC

## 2020-05-27 MED ORDER — PANTOPRAZOLE SODIUM 40 MG IV SOLR
40.0000 mg | Freq: Every day | INTRAVENOUS | Status: DC
  Administered 2020-05-27: 40 mg via INTRAVENOUS
  Filled 2020-05-27: qty 40

## 2020-05-27 MED ORDER — CLEVIDIPINE BUTYRATE 0.5 MG/ML IV EMUL
INTRAVENOUS | Status: AC
  Administered 2020-05-27: 50 mg
  Filled 2020-05-27: qty 100

## 2020-05-27 MED ORDER — NITROGLYCERIN 1 MG/10 ML FOR IR/CATH LAB
INTRA_ARTERIAL | Status: AC
  Administered 2020-05-27: 50 ug via INTRA_ARTERIAL
  Administered 2020-05-27: 25 ug
  Administered 2020-05-27: 50 ug via INTRA_ARTERIAL
  Filled 2020-05-27: qty 10

## 2020-05-27 MED ORDER — FENTANYL CITRATE (PF) 250 MCG/5ML IJ SOLN
INTRAMUSCULAR | Status: DC | PRN
  Administered 2020-05-27: 100 ug via INTRAVENOUS

## 2020-05-27 MED ORDER — ONDANSETRON 4 MG PO TBDP: 4.0000 mg | ORAL_TABLET | Freq: Three times a day (TID) | ORAL | Status: DC | PRN

## 2020-05-27 MED ORDER — DEXAMETHASONE SODIUM PHOSPHATE 10 MG/ML IJ SOLN
INTRAMUSCULAR | Status: DC | PRN
  Administered 2020-05-27: 8 mg via INTRAVENOUS

## 2020-05-27 MED ORDER — SENNOSIDES-DOCUSATE SODIUM 8.6-50 MG PO TABS: 1.0000 | ORAL_TABLET | Freq: Every evening | ORAL | Status: DC | PRN

## 2020-05-27 MED ORDER — ACETAMINOPHEN 325 MG PO TABS
650.0000 mg | ORAL_TABLET | ORAL | Status: DC | PRN
  Administered 2020-05-27 – 2020-05-28 (×3): 650 mg via ORAL
  Filled 2020-05-27 (×3): qty 2

## 2020-05-27 MED ORDER — LIDOCAINE 2% (20 MG/ML) 5 ML SYRINGE
INTRAMUSCULAR | Status: DC | PRN
  Administered 2020-05-27: 100 mg via INTRAVENOUS

## 2020-05-27 MED ORDER — TICAGRELOR 90 MG PO TABS
ORAL_TABLET | ORAL | Status: AC
  Filled 2020-05-27: qty 2

## 2020-05-27 MED ORDER — CANGRELOR TETRASODIUM 50 MG IV SOLR
INTRAVENOUS | Status: AC
  Filled 2020-05-27: qty 50

## 2020-05-27 MED ORDER — PROPOFOL 10 MG/ML IV BOLUS
INTRAVENOUS | Status: DC | PRN
  Administered 2020-05-27: 180 mg via INTRAVENOUS

## 2020-05-27 MED ORDER — LABETALOL HCL 5 MG/ML IV SOLN
INTRAVENOUS | Status: DC | PRN
  Administered 2020-05-27 (×2): 2.5 mg via INTRAVENOUS
  Administered 2020-05-27: 5 mg via INTRAVENOUS

## 2020-05-27 MED ORDER — ASPIRIN 81 MG PO CHEW
CHEWABLE_TABLET | ORAL | Status: AC
  Filled 2020-05-27: qty 1

## 2020-05-27 NOTE — ED Triage Notes (Addendum)
Pt was upstairs on peds unit with her child and had sudden onset of L sided weakness and "face drawn to the side" after sneezing. LKW 1000.  States she is unable to raise L arm.   TRN was called by peds floor and pt brought to ED.  NS notified to activate Code Stroke.  Rapid Response nurse at triage.  Pt to bridge and airway clearance by Dr. Lynelle Doctor.  Pt to CT with Alycia Rossetti, RN.  Symptoms improving while at bridge.

## 2020-05-27 NOTE — Anesthesia Procedure Notes (Signed)
Procedure Name: Intubation Date/Time: 05/27/2020 11:45 AM Performed by: Sheppard Evens, CRNA Pre-anesthesia Checklist: Patient identified, Emergency Drugs available, Suction available and Patient being monitored Patient Re-evaluated:Patient Re-evaluated prior to induction Oxygen Delivery Method: Circle system utilized Preoxygenation: Pre-oxygenation with 100% oxygen Induction Type: IV induction and Rapid sequence Ventilation: Mask ventilation without difficulty Laryngoscope Size: Glidescope and 3 Grade View: Grade I Tube type: Oral Tube size: 8.0 mm Number of attempts: 1 Airway Equipment and Method: Stylet Placement Confirmation: ETT inserted through vocal cords under direct vision,  positive ETCO2,  breath sounds checked- equal and bilateral and CO2 detector Secured at: 22 cm Tube secured with: Tape Dental Injury: Teeth and Oropharynx as per pre-operative assessment

## 2020-05-27 NOTE — Progress Notes (Signed)
Patient ID: Christina [name], female   DOB: [birth year], 41 y.o.   MRN: [id number] INR. 39 Y RT H F [facility] 0 LSW 10 am . New onset Lt sided weakness incuding Lt side of fgace. CT Brain NO ICH ASPECTS 10 CTA occluded Lt MCA M1 seg. Endovascular treatment D/W husband. Procedure ,reasons and alternatives reviewed. Risks ICH of 10 % ,worsening neuro function,death D/W the spouse who expressed understanding and provided consent to the treatment. S.Laurrie Toppin MDF.

## 2020-05-27 NOTE — Progress Notes (Addendum)
1400: Patient arrived to unit around 1400.  Patient's right groin site is clean, dry and intact at this time.  Patient able to move all four extremities.  Patient placed on 5 liters nasal cannula.  Bedside report given from IR.  Hemostasis achieved at 1316 per IR, dressing marked with time.   Neurology NP and chaplin came to assess patient.     Patient's family called and updated.  No belongings noted in transfer.

## 2020-05-27 NOTE — Progress Notes (Signed)
Picked up patient's belongings from son on [address], where son is admitted. Belongings include pt's purse and phone.

## 2020-05-27 NOTE — ED Provider Notes (Signed)
[facility] EMERGENCY DEPARTMENT Provider Note   CSN: [id number] Arrival date & time: 05/27/20  1005  LEVEL 5 CAVEAT - ACUITY OF CONDITION History No chief complaint on file.   Christina [name] is a 40 y.o. female.  HPI 40 year old female presents as a code stroke.  History is limited due to the acuity of the situation.  She was upstairs visiting her child who is admitted to pediatrics and she sneezed and all of a sudden had right-sided facial droop, drooling, slurred speech and left-sided weakness.  She feels like a headache is coming on on the right side but is not really painful.  Did not take any her meds today including her hypertension medicines. Last normal was just before 10 am. The extremity weakness seems to have resolved.    Past Medical History:  Diagnosis Date  . Asthma   . Diabetes mellitus without complication (HCC)   . Hypertension     Patient Active Problem List   Diagnosis Date Noted  . Acute ischemic right MCA stroke (HCC) 05/27/2020    Past Surgical History:  Procedure Laterality Date  . TUBAL LIGATION       OB History   No obstetric history on file.     No family history on file.  Social History   Tobacco Use  . Smoking status: Current Every Day Smoker    Packs/day: 0.25    Types: Cigarettes  . Smokeless tobacco: Never Used  Vaping Use  . Vaping Use: Never used  Substance Use Topics  . Alcohol use: Yes    Comment: occ  . Drug use: No    Home Medications Prior to Admission medications   Medication Sig Start Date End Date Taking? Authorizing Provider  albuterol (PROVENTIL HFA;VENTOLIN HFA) 108 (90 BASE) MCG/ACT inhaler Inhale 2 puffs into the lungs every 6 (six) hours as needed for wheezing.    [provider]  BREO ELLIPTA 100-25 MCG/INH AEPB Inhale 1 puff into the lungs daily. 08/10/19   [provider]  fexofenadine (ALLEGRA) 180 MG tablet Take 180 mg by mouth daily.    [provider]   hydrochlorothiazide (HYDRODIURIL) 12.5 MG tablet Take 12.5 mg by mouth daily.    [provider]  hydrochlorothiazide (HYDRODIURIL) 25 MG tablet Take 25 mg by mouth daily. 03/28/20   [provider]  ibuprofen (ADVIL) 600 MG tablet Take 1 tablet (600 mg total) by mouth every 6 (six) hours as needed for moderate pain. 12/26/19   Sponseller, Lupe Carney R, PA-C  insulin glargine (LANTUS) 100 UNIT/ML Solostar Pen Inject 32 Units into the skin at bedtime. 06/28/19   [provider]  JANUVIA 100 MG tablet Take 100 mg by mouth daily. 08/11/19   [provider]  metFORMIN (GLUCOPHAGE-XR) 500 MG 24 hr tablet Take 1,000 mg by mouth daily with breakfast.    [provider]  methocarbamol (ROBAXIN) 500 MG tablet Take 1 tablet (500 mg total) by mouth 2 (two) times daily as needed for muscle spasms. 12/26/19   Sponseller, Lupe Carney R, PA-C  metoprolol tartrate (LOPRESSOR) 100 MG tablet Take 100 mg by mouth daily. 08/11/19   [provider]  ondansetron (ZOFRAN ODT) 4 MG disintegrating tablet Take 1 tablet (4 mg total) by mouth every 8 (eight) hours as needed for nausea or vomiting. 09/16/19   Alvira Monday, MD  OZEMPIC, 1 MG/DOSE, 4 MG/3ML SOPN Inject 0.75 mLs into the skin once a week. 05/18/20   [provider]  rosuvastatin (CRESTOR)  20 MG tablet Take 20 mg by mouth daily. 03/28/20   [provider]  rosuvastatin (CRESTOR) 40 MG tablet Take 40 mg by mouth daily. 08/11/19   [provider]  fluticasone (FLONASE) 50 MCG/ACT nasal spray Place 1 spray into both nostrils daily. 08/02/17 12/02/18  Maxwell Caul, PA-C    Allergies    Peanut-containing drug products, Lisinopril, Penicillins, and Tomato  Review of Systems   Review of Systems  Unable to perform ROS: Acuity of condition    Physical Exam Updated Vital Signs BP (!) 153/109 (BP Location: Right Arm)   Temp 98.1 F (36.7 C) (Oral)   Resp 20   Wt 133.8 kg   BMI 43.56 kg/m    Physical Exam Vitals and nursing note reviewed.  Constitutional:      Appearance: She is well-developed. She is obese. She is not diaphoretic.  HENT:     Head: Normocephalic and atraumatic.     Right Ear: External ear normal.     Left Ear: External ear normal.     Nose: Nose normal.  Eyes:     General:        Right eye: No discharge.        Left eye: No discharge.     Extraocular Movements: Extraocular movements intact.  Cardiovascular:     Rate and Rhythm: Regular rhythm. Tachycardia present.     Heart sounds: Normal heart sounds.     Comments: HR~120 Pulmonary:     Effort: Pulmonary effort is normal.     Breath sounds: Normal breath sounds.  Abdominal:     Palpations: Abdomen is soft.     Tenderness: There is no abdominal tenderness.  Skin:    General: Skin is warm and dry.  Neurological:     Mental Status: She is alert.     Comments: CN 3-12 grossly intact, except for right mouth droop. 5/5 strength in all 4 extremities. Grossly normal sensation. Normal finger to nose.   Psychiatric:        Mood and Affect: Mood is not anxious.     ED Results / Procedures / Treatments   Labs (all labs ordered are listed, but only abnormal results are displayed) Labs Reviewed  I-STAT CHEM 8, ED - Abnormal; Notable for the following components:      Result Value   Sodium 134 (*)    Chloride 93 (*)    Glucose, Bld 322 (*)    Hemoglobin 16.0 (*)    HCT 47.0 (*)    All other components within normal limits  CBG MONITORING, ED - Abnormal; Notable for the following components:   Glucose-Capillary 293 (*)    All other components within normal limits  RESP PANEL BY RT-PCR (FLU A&B, COVID) ARPGX2  PROTIME-INR  APTT  CBC  DIFFERENTIAL  COMPREHENSIVE METABOLIC PANEL  I-STAT BETA HCG BLOOD, ED ([facility], [facility], [facility] ONLY)    EKG None  Radiology CT HEAD CODE STROKE WO CONTRAST  Result Date: 05/27/2020 CLINICAL DATA:  Code stroke. Left-sided weakness and facial drooping, sudden onset  after sneezing EXAM: CT HEAD WITHOUT CONTRAST TECHNIQUE: Contiguous axial images were obtained from the base of the skull through the vertex without intravenous contrast. COMPARISON:  Head CT 07/11/2014 FINDINGS: Brain: There is prominent degree of streak artifact bilaterally. No evidence of acute infarction, hemorrhage, hydrocephalus, extra-axial collection or mass lesion/mass effect. Low cerebellar tonsils, 5-6 mm below the foramen magnum, unchanged from prior. 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 Vascular: No hyperdense vessel or unexpected calcification. Skull: Negative Sinuses/Orbits: Negative Other: Critical Value/emergent results were called by telephone at the time of interpretation on 05/27/2020 at 10:43 am to Dr Iver Nestle, who verbally acknowledged these results. ASPECTS [facility] Stroke Program Early CT Score) - Ganglionic level infarction (caudate, lentiform nuclei, internal capsule, insula, M1-M3 cortex): 7 - Supraganglionic infarction (M4-M6 cortex): 3 Total score (0-10 with 10 being normal): 10 IMPRESSION: 1. No acute finding. 2. 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 Electronically Signed   By: Marnee Spring M.D.   On: 05/27/2020 10:46   CT ANGIO HEAD CODE STROKE  Result Date: 05/27/2020 CLINICAL DATA:  Acute stroke suspected. EXAM: CT ANGIOGRAPHY HEAD AND NECK TECHNIQUE: Multidetector CT imaging of the head and neck was performed using the standard protocol during bolus administration of intravenous contrast. Multiplanar CT image reconstructions and MIPs were obtained to evaluate the vascular anatomy. Carotid stenosis measurements (when applicable) are obtained utilizing NASCET criteria, using the distal internal carotid diameter as the denominator. CONTRAST:  Dose is currently not known COMPARISON:  Head  CT from earlier the same day FINDINGS: CTA NECK FINDINGS Aortic arch: Unremarkable Right carotid system: Vessels appear smooth and widely patent. No noted dissection, ulceration, or generalized beading. Tortuous ICA. Left carotid system: Tortuous ICA and partial retropharyngeal course. No evidence of stenosis, beading, or aneurysm. Vertebral arteries: No proximal subclavian stenosis. Both vertebral arteries are patent to the dura. Mild but fairly smoothly contoured narrowing of the left vertebral artery at the C2 level. Skeleton: No acute finding Other neck: No visible soft tissue mass or inflammation. Upper chest: Negative Review of the MIP images confirms the above findings CTA HEAD FINDINGS Anterior circulation: Limited by small vessels and soft tissue attenuation. Right M1 occlusion with faint downstream reconstitution. Pial collaterals are enhancing. No additional anterior circulation embolism is seen. No evidence of chronic stenosis. Posterior circulation: Small appearance of the left V4 segment but fairly smoothly contoured as permitted by technique and CTA quality. No branch occlusion is seen. Venous sinuses: Patent Anatomic variants: None significant Review of the MIP images confirms the above findings Critical Value/emergent results were called by telephone at the time of interpretation on 05/27/2020 at 11:04 am to provider [facility] , who verbally acknowledged these results. IMPRESSION: 1. Right M1 occlusion. No detected embolic source in the neck, although suboptimal CTA quality due to motion and soft tissue attenuation. 2. Narrowing of the left vertebral artery at the level of C2 and V4, consider injection at catheter angiogram to exclude FMD or dissection. Electronically Signed   By: Marnee Spring M.D.   On: 05/27/2020 11:08   CT ANGIO NECK CODE STROKE  Result Date: 05/27/2020 CLINICAL DATA:  Acute stroke suspected. EXAM: CT ANGIOGRAPHY HEAD AND NECK TECHNIQUE: Multidetector CT imaging of the  head and neck was performed using the standard protocol during bolus administration of intravenous contrast. Multiplanar CT image reconstructions and MIPs were obtained to evaluate the vascular anatomy. Carotid stenosis measurements (when applicable) are obtained utilizing NASCET criteria, using the distal internal carotid diameter as the denominator. CONTRAST:  Dose is currently not known COMPARISON:  Head CT from earlier the same day FINDINGS: CTA NECK FINDINGS Aortic arch: Unremarkable Right carotid system: Vessels appear smooth and widely patent. No noted dissection, ulceration, or generalized beading. Tortuous ICA. Left carotid system: Tortuous ICA and partial retropharyngeal course.  No evidence of stenosis, beading, or aneurysm. Vertebral arteries: No proximal subclavian stenosis. Both vertebral arteries are patent to the dura. Mild but fairly smoothly contoured narrowing of the left vertebral artery at the C2 level. Skeleton: No acute finding Other neck: No visible soft tissue mass or inflammation. Upper chest: Negative Review of the MIP images confirms the above findings CTA HEAD FINDINGS Anterior circulation: Limited by small vessels and soft tissue attenuation. Right M1 occlusion with faint downstream reconstitution. Pial collaterals are enhancing. No additional anterior circulation embolism is seen. No evidence of chronic stenosis. Posterior circulation: Small appearance of the left V4 segment but fairly smoothly contoured as permitted by technique and CTA quality. No branch occlusion is seen. Venous sinuses: Patent Anatomic variants: None significant Review of the MIP images confirms the above findings Critical Value/emergent results were called by telephone at the time of interpretation on 05/27/2020 at 11:04 am to provider [facility] , who verbally acknowledged these results. IMPRESSION: 1. Right M1 occlusion. No detected embolic source in the neck, although suboptimal CTA quality due to motion and  soft tissue attenuation. 2. Narrowing of the left vertebral artery at the level of C2 and V4, consider injection at catheter angiogram to exclude FMD or dissection. Electronically Signed   By: Marnee Spring M.D.   On: 05/27/2020 11:08    Procedures .Critical Care Performed by: Pricilla Loveless, MD Authorized by: Pricilla Loveless, MD   Critical care provider statement:    Critical care time (minutes):  30   Critical care time was exclusive of:  Separately billable procedures and treating other patients   Critical care was necessary to treat or prevent imminent or life-threatening deterioration of the following conditions:  CNS failure or compromise   Critical care was time spent personally by me on the following activities:  Discussions with consultants, evaluation of patient's response to treatment, examination of patient, ordering and performing treatments and interventions, ordering and review of laboratory studies, ordering and review of radiographic studies, pulse oximetry, re-evaluation of patient's condition, obtaining history from patient or surrogate and review of old charts     Medications Ordered in ED Medications  sodium chloride flush (NS) 0.9 % injection 3 mL (has no administration in time range)  sodium chloride 0.9 % bolus 1,000 mL (has no administration in time range)  alteplase (ACTIVASE) 1 mg/mL infusion 90 mg (has no administration in time range)    Followed by  0.9 %  sodium chloride infusion (has no administration in time range)  iohexol (OMNIPAQUE) 350 MG/ML injection 50 mL (50 mLs Intravenous Contrast Given 05/27/20 1106)    ED Course  I have reviewed the triage vital signs and the nursing notes.  Pertinent labs & imaging results that were available during my care of the patient were reviewed by me and considered in my medical decision making (see chart for details).    MDM Rules/Calculators/A&P                          Code stroke was called in triage. I  initially evaluated patient in CT scanner. Weakness seems improved but still with slurred speech/facial droop. After initial negative head CT and neuro eval, she was given tpa by neurology. Based on CTA findings she was also taken emergently to IR. BP is borderline in the diastolic, will need close monitoring but no acute BP management otherwise.  Final Clinical Impression(s) / ED Diagnoses Final diagnoses:  Acute ischemic stroke (HCC)  Rx / DC Orders ED Discharge Orders    None       Pricilla LovelessGoldston, Lacy Sofia, MD 05/27/20 [phone]

## 2020-05-27 NOTE — Consult Note (Addendum)
Neurology Consultation Reason for Consult: Code stroke Requesting Physician: Pricilla Loveless  CC: Left arm weakness   History is obtained from: Patient and chart review   HPI: Christina [name] is a 40 y.o. female with a past medical history significant for diabetes, hypertension, hyperlipidemia, obesity, asthma, smoking presenting with acute onset left-sided weakness and right-sided sensory changes per initial report.  She was visiting her son, sneezed, and then had acute onset left arm weakness for which code stroke was activated.  Initially she was reporting sensory changes in the right face arm and leg, had a left facial droop and mild left upper extremity weakness concerning for potential pontine lesion, with concern for vertebral artery dissection.  Head CT was negative for acute intracranial process, patient was consented for tPA with tPA checklist reviewed in detail.  CTA subsequently revealed surprisingly a right M1 stenosis.  On reexamination she was having worsening left-sided symptoms with increased left upper extremity weakness, facial droop, very mild neglect of the left side, very subtle gaze preference, and beginning to have left-sided numbness as well.  Patient and husband were consented for thrombectomy and code IR was activated  LKW: 10 AM tPA given?:  Yes, 10:46 AM IA performed?:  Yes Premorbid modified rankin scale:      0 - No symptoms.  ROS: All other review of systems was negative except as noted in the HPI.   Past Medical History:  Diagnosis Date  . Asthma   . Diabetes mellitus without complication (HCC)   . Hypertension    Past Surgical History:  Procedure Laterality Date  . TUBAL LIGATION     Current Outpatient Medications  Medication Instructions  . albuterol (PROVENTIL HFA;VENTOLIN HFA) 108 (90 BASE) MCG/ACT inhaler 2 puffs, Inhalation, Every 6 hours PRN  . BREO ELLIPTA 100-25 MCG/INH AEPB 1 puff, Inhalation, Daily  . fexofenadine (ALLEGRA) 180 mg, Oral,  Daily  . hydrochlorothiazide (HYDRODIURIL) 12.5 mg, Oral, Daily  . hydrochlorothiazide (HYDRODIURIL) 25 mg, Oral, Daily  . ibuprofen (ADVIL) 600 mg, Oral, Every 6 hours PRN  . insulin glargine (LANTUS) 32 Units, Subcutaneous, Daily at bedtime  . Januvia 100 mg, Oral, Daily  . metFORMIN (GLUCOPHAGE-XR) 1,000 mg, Oral, Daily with breakfast  . methocarbamol (ROBAXIN) 500 mg, Oral, 2 times daily PRN  . metoprolol tartrate (LOPRESSOR) 100 mg, Oral, Daily  . ondansetron (ZOFRAN ODT) 4 mg, Oral, Every 8 hours PRN  . OZEMPIC, 1 MG/DOSE, 4 MG/3ML SOPN 0.75 mLs, Subcutaneous, Weekly  . rosuvastatin (CRESTOR) 40 mg, Oral, Daily  . rosuvastatin (CRESTOR) 20 mg, Oral, Daily    No family history on file. Not reviewed in emergent setting   Social History:  reports that she has been smoking cigarettes. She has been smoking about 0.25 packs per day. She has never used smokeless tobacco. She reports current alcohol use. She reports that she does not use drugs.   Exam: Current vital signs: BP (!) 153/109 (BP Location: Right Arm)   Temp 98.1 F (36.7 C) (Oral)   Resp 20  Vital signs in last 24 hours: Temp:  [98.1 F (36.7 C)] 98.1 F (36.7 C) (04/24 1011) Resp:  [20] 20 (04/24 1011) BP: (153)/(109) 153/109 (04/24 1011)   Physical Exam  Constitutional: Appears well-developed and well-nourished.  Psych: Affect appropriate to situation, calm and cooperative  Eyes: No scleral injection HENT: No oropharyngeal obstruction.  MSK: no joint deformities.  Cardiovascular: Normal rate and regular rhythm.  Respiratory: Effort normal, non-labored breathing GI: Soft.  No distension. There is  no tenderness.  Skin: Warm dry and intact visible skin  Neuro: Mental Status: Patient is awake, alert, oriented to person, place, month, year, and situation. Patient is able to give a clear and coherent history. No signs of aphasia or neglect initially, but then on later evaluation had some neglect to double  sided stimulation on the left side as well as developing a right gaze preference Cranial Nerves: II: Visual Fields are full initially, possibly was developing of the left hemianopia on later examination. Pupils are equal, round, and reactive to light.   III,IV, VI: EOMI without ptosis or diploplia.  V: Facial sensation is reduced on the right. VII: Facial movement is reduced on the left. VIII: hearing is intact to voice  X: Uvula elevates symmetrically XI: Shoulder shrug is symmetric. XII: tongue is midline without atrophy or fasciculations.  Motor: Tone is normal. Bulk is normal.  Initially had some slight finger curling of the left upper extremity which later progressed to drift.  No drift of any of the other 3 extremities.  On repeat testing left grip strength is also weaker than the right Sensory: Initially was reporting a change in sensation on the right face arm and leg, but then later reported increasing left arm numbness Cerebellar: FNF and HKS are intact bilaterally  NIHSS total 6 Score breakdown: 2 points for left facial droop, one-point for left upper extremity drift, one-point for left-sided sensory loss, one-point for dysarthria, one-point for extinction on the left side   I have reviewed labs in epic and the results pertinent to this consultation are: Glu 293 Creatinine 0.8  I have reviewed the images obtained:  Head CT neg for acute intracranial process CTA with a right M1 cutoff, note exam was somewhat limited due to body habitus and skin quality  Full radiology read 1. Right M1 occlusion. No detected embolic source in the neck, although suboptimal CTA quality due to motion and soft tissue attenuation. 2. Narrowing of the left vertebral artery at the level of C2 and V4, consider injection at catheter angiogram to exclude FMD or dissection.  Impression: Acute right MCA stroke.  Unclear etiology of the right-sided symptoms, potential for multifocal process.   Suspect she may have some underlying cardiac issues given that her blood pressure was only in the 150s despite large vessel occlusion, though notably the diastolic blood pressure was elevated and did require 5 mg of labetalol.    Assessment:  Plan:  Acute Ischemic Stroke Cerebral infarction due to embolism of right middle cerebral artery Dissection of Vertebral Artery   Acuity: Acute Current Suspected Etiology: Cardioembolic versus dissection -Admit to: ICU -Continue Statin once patient is no longer n.p.o., adjust dose as needed for LDL goal less than 70 -Hold Aspirin until 24 hour post tPA neuroimaging is stable and without evidence of bleeding -Blood pressure control, goal of SYS 120 to 140 mmHg or per neurointerventional radiology -MRI/ECHO/A1C/Lipid panel. -Hyperglycemia management per SSI to maintain glucose 140-180mg /dL. -PT/OT/ST therapies and recommendations when able  CNS -Close neuro monitoring  Dysarthria High risk for dysphagia following cerebral infarction  -NPO until cleared by speech or bedside swallow evaluation -Advance diet as tolerated  Hemiparesis following cerebral infarction affecting left non-dominant side  -PT/OT -PM&R consult   RESP #Intubated for procedure -Plan to extubate postprocedure if no significant periprocedural complications  #Asthma -Continue home Breo Ellipta -Resume home fexofenadine once cleared for p.o. -Note she does tolerate her home metoprolol despite asthma  CV Essential (primary) hypertension -Aggressive BP control,  goal SBP as above under neuro plan,  -Cleviprex drip for now -Titrate oral agents (home hydrochlorothiazide 37.5 mg daily, pending confirmation by pharmacy med rec) -Converting home metoprolol 100 mg daily to IV 2.5 mg every 6 hours (half home dose) to avoid rebound tachycardia from beta-blocker withdrawal -Consider CCM or cards Consult if pulse pressure remains narrow -Follow-up ECG  Hyperlipidemia,  unspecified  -Increase home statin for goal LDL < 70 if needed  HEME No active issues, note patient did receive tPA  ENDO Type 2 diabetes mellitus with hyperglycemia  -SSI, high-dose -Hold oral meds for now -goal HgbA1c < 7  GI/GU No active issues -Gentle hydration -avoid nephrotoxic agents -renal consult  Fluid/Electrolyte Disorders -Replete -Repeat labs  ID High risk for aspiration pneumonia given stroke -NPO until cleared -Monitor  Nutrition E66.9 Obesity    Prophylaxis DVT: SCDs until 24 hours post tPA administration then may start chemoprophylaxis if patient is stable and imaging is stable GI: Pantoprazole Bowel: Senna  Diet: NPO until cleared by speech  Code Status: Full Code  Comfort Measures DNR   THE FOLLOWING WERE PRESENT ON ADMISSION: CNS -  Acute Ischemic Stroke, Hemiparesis, Cardiovascular - likely heart failure given inadequate blood pressure for large vessel occlusion  Brooke Dare MD-PhD [facility] [phone] Available 7 AM to 7 PM, outside these hours please contact Neurologist on call listed on AMION   Total critical care time: 70 minutes   Critical care time was exclusive of separately billable procedures and treating other patients.   Critical care was necessary to treat or prevent imminent or life-threatening deterioration.   Critical care was time spent personally by me on the following activities: development of treatment plan with patient and/or surrogate as well as nursing, discussions with consultants/primary team, evaluation of patient's response to treatment, examination of patient, obtaining history from patient or surrogate, ordering and performing treatments and interventions, ordering and review of laboratory studies, ordering and review of radiographic studies, and re-evaluation of patient's condition as needed, as documented above.

## 2020-05-27 NOTE — Anesthesia Preprocedure Evaluation (Signed)
Anesthesia Evaluation  Patient identified by MRN, date of birth, ID band Patient confused  General Assessment Comment:CODE STROKE  Reviewed: Allergy & Precautions, NPO status , Patient's Chart, lab work & pertinent test results, reviewed documented beta blocker date and time   Airway Mallampati: II  TM Distance: >3 FB Neck ROM: Full   Comment: Tongue ring in place Dental  (+) Teeth Intact, Dental Advisory Given   Pulmonary asthma , Current Smoker,    Pulmonary exam normal breath sounds clear to auscultation       Cardiovascular hypertension, Pt. on medications and Pt. on home beta blockers Normal cardiovascular exam Rhythm:Regular Rate:Normal     Neuro/Psych CVA, Residual Symptoms    GI/Hepatic negative GI ROS, Neg liver ROS,   Endo/Other  diabetes, Type 2, Oral Hypoglycemic Agents, Insulin DependentMorbid obesity  Renal/GU negative Renal ROS     Musculoskeletal negative musculoskeletal ROS (+)   Abdominal   Peds  Hematology negative hematology ROS (+)   Anesthesia Other Findings Day of surgery medications reviewed with the patient.  Reproductive/Obstetrics                             Anesthesia Physical Anesthesia Plan  ASA: IV and emergent  Anesthesia Plan: General   Post-op Pain Management:    Induction: Intravenous, Rapid sequence and Cricoid pressure planned  PONV Risk Score and Plan: 2 and Ondansetron and Treatment may vary due to age or medical condition  Airway Management Planned: Oral ETT  Additional Equipment: Arterial line  Intra-op Plan:   Post-operative Plan: Possible Post-op intubation/ventilation  Informed Consent: I have reviewed the patients History and Physical, chart, labs and discussed the procedure including the risks, benefits and alternatives for the proposed anesthesia with the patient or authorized representative who has indicated his/her understanding  and acceptance.     Dental advisory given  Plan Discussed with: CRNA  Anesthesia Plan Comments: (Code stroke. Patient met in intubation bay outside IR suite.  Brief medical history obtained.  Pre-op eval completed after induction of anesthesia due to emergent nature of procedure.)        Anesthesia Quick Evaluation

## 2020-05-27 NOTE — Procedures (Signed)
S/P Rt common carotid arteriogram Rt CFA approach. Findings. 1.complete revascularization of occluded RT MCA M 1  With x 2 passes with 42mm x 40 mm Solitaire X retriever and contact aspiration achieving a TICI 3 revascularization. 47F angioseal for hemostasis at CFA puncture site. Distal pulses all dopplerable. PosT CT minimal hyper attenuation the Rt sylvian fissure. Extubated. Pupils 3 mm RT = LT sluggish. Follows simple instructions appropriately. Able to lift Lt UE and bend Lt leg . S.Nadene Witherspoon MD

## 2020-05-27 NOTE — Anesthesia Procedure Notes (Signed)
Arterial Line Insertion Start/End4/24/2022 11:50 AM, 05/27/2020 11:54 AM Performed by: Sheppard Evens, CRNA, CRNA  Preanesthetic checklist: patient identified Left, radial was placed Catheter size: 20 G Maximum sterile barriers used   Attempts: 1 Procedure performed without using ultrasound guided technique. Ultrasound Notes:anatomy identified Following insertion, dressing applied and Biopatch.

## 2020-05-27 NOTE — H&P (Signed)
Please see consult note from same date  Brooke Dare MD-PhD [facility] [phone]

## 2020-05-27 NOTE — Transfer of Care (Signed)
Immediate Anesthesia Transfer of Care Note  Patient: Christina [name]  Procedure(s) Performed: IR WITH ANESTHESIA (N/A )  Patient Location: ICU  Anesthesia Type:General  Level of Consciousness: drowsy and patient cooperative  Airway & Oxygen Therapy: Patient Spontanous Breathing and Patient connected to face mask oxygen  Post-op Assessment: Report given to RN, Post -op Vital signs reviewed and stable and Patient moving all extremities X 4  Post vital signs: Reviewed and stable  Last Vitals:  Vitals Value Taken Time  BP 139/95 05/27/20 1350  Temp    Pulse 112 05/27/20 1359  Resp 24 05/27/20 1359  SpO2 96 % 05/27/20 1359  Vitals shown include unvalidated device data.  Last Pain:  Vitals:   05/27/20 1047  TempSrc: Oral         Complications: No complications documented.

## 2020-05-27 NOTE — Anesthesia Postprocedure Evaluation (Signed)
Anesthesia Post Note  Patient: Christina [name]  Procedure(s) Performed: IR WITH ANESTHESIA (N/A )     Patient location during evaluation: ICU Anesthesia Type: General Level of consciousness: awake and alert Pain management: pain level controlled Vital Signs Assessment: post-procedure vital signs reviewed and stable Respiratory status: spontaneous breathing, nonlabored ventilation, respiratory function stable and patient connected to nasal cannula oxygen Cardiovascular status: blood pressure returned to baseline, stable and tachycardic Postop Assessment: no apparent nausea or vomiting Anesthetic complications: no   No complications documented.  Last Vitals:  Vitals:   05/27/20 1430 05/27/20 1500  BP:    Pulse: (!) 126 (!) 122  Resp: (!) 23 19  Temp:    SpO2: 95% 94%    Last Pain:  Vitals:   05/27/20 1047  TempSrc: Oral                 Cecile Hearing

## 2020-05-27 NOTE — Code Documentation (Signed)
Stroke Response Nurse Documentation Code Documentation  Quincee Gittens is a 40 y.o. female arriving to [facility] ED via wheelchair from . Patient from where she was LKW at 1000 and now complaining off right sided facial droop, left sided hemiplegia. Patient has history of DM, HTN, HLD, asthma, current smoker, and current alcohol use.  Patient is taking Metformin, januvia, metoprolol, rosuvastatin, lantus, and HCTZ. Stroke team at the bedside on patient arrival. Labs drawn and patient cleared for CT by Dr. Criss Alvine. Patient to CT with team. NIHSS 4, see documentation for details and code stroke times. Patient with NIHSS of 6 on arrival to IR bay 8 on exam. The following imaging was completed: (CT, CTA head and neck, CTP). Patient is a candidate for tPA and was taken to IR for mechanical thrombectomy Care/Plan. Bedside handoff with ED RN Alycia Rossetti.    Ernest Pine Holdan Stucke  Rapid Response RN

## 2020-05-27 NOTE — Progress Notes (Signed)
Pharmacist Code Stroke Response  Notified to mix tPA at 1040 by Dr. Iver Nestle Delivered tPA to RN at 1045 tPA administered at 1046  tPA dose = 9mg  bolus over 1 minute followed by 81mg  for a total dose of 90mg  over 1 hour  Issues/delays encountered (if applicable): n/a  , PharmD., BCPS, BCCCP Clinical Pharmacist Please refer to [facility] for unit-specific pharmacist

## 2020-05-27 NOTE — Progress Notes (Signed)
Pt was out of the room getting a procedure.  I spoke with the husband he thought that I was there to tell him that his wife died. I explained that I had just spoke to his son up on the 6th floor. He shared what happened to his wife. The chaplain offered caring and supportive presence. Follow-up will be offered.

## 2020-05-27 NOTE — ED Notes (Signed)
Patient arrived in IR intubation room

## 2020-05-28 ENCOUNTER — Inpatient Hospital Stay (HOSPITAL_COMMUNITY): Payer: BC Managed Care – PPO

## 2020-05-28 ENCOUNTER — Encounter (HOSPITAL_COMMUNITY): Payer: Self-pay | Admitting: Radiology

## 2020-05-28 DIAGNOSIS — I639 Cerebral infarction, unspecified: Secondary | ICD-10-CM | POA: Diagnosis not present

## 2020-05-28 DIAGNOSIS — I6389 Other cerebral infarction: Secondary | ICD-10-CM

## 2020-05-28 DIAGNOSIS — I63511 Cerebral infarction due to unspecified occlusion or stenosis of right middle cerebral artery: Principal | ICD-10-CM

## 2020-05-28 LAB — CBC WITH DIFFERENTIAL/PLATELET
Abs Immature Granulocytes: 0.06 10*3/uL (ref 0.00–0.07)
Basophils Absolute: 0 10*3/uL (ref 0.0–0.1)
Basophils Relative: 0 %
Eosinophils Absolute: 0 10*3/uL (ref 0.0–0.5)
Eosinophils Relative: 0 %
HCT: 38.4 % (ref 36.0–46.0)
Hemoglobin: 12.9 g/dL (ref 12.0–15.0)
Immature Granulocytes: 0 %
Lymphocytes Relative: 17 %
Lymphs Abs: 2.3 10*3/uL (ref 0.7–4.0)
MCH: 31.1 pg (ref 26.0–34.0)
MCHC: 33.6 g/dL (ref 30.0–36.0)
MCV: 92.5 fL (ref 80.0–100.0)
Monocytes Absolute: 1.1 10*3/uL — ABNORMAL HIGH (ref 0.1–1.0)
Monocytes Relative: 8 %
Neutro Abs: 10 10*3/uL — ABNORMAL HIGH (ref 1.7–7.7)
Neutrophils Relative %: 75 %
Platelets: 314 10*3/uL (ref 150–400)
RBC: 4.15 MIL/uL (ref 3.87–5.11)
RDW: 11.8 % (ref 11.5–15.5)
WBC: 13.5 10*3/uL — ABNORMAL HIGH (ref 4.0–10.5)
nRBC: 0 % (ref 0.0–0.2)

## 2020-05-28 LAB — ECHOCARDIOGRAM COMPLETE BUBBLE STUDY
AR max vel: 2.48 cm2
AV Area VTI: 2.6 cm2
AV Area mean vel: 2.51 cm2
AV Mean grad: 3 mmHg
AV Peak grad: 4.9 mmHg
Ao pk vel: 1.11 m/s
Area-P 1/2: 4.1 cm2
S' Lateral: 3 cm

## 2020-05-28 LAB — RAPID URINE DRUG SCREEN, HOSP PERFORMED
Amphetamines: NOT DETECTED
Barbiturates: NOT DETECTED
Benzodiazepines: NOT DETECTED
Cocaine: NOT DETECTED
Opiates: NOT DETECTED
Tetrahydrocannabinol: NOT DETECTED

## 2020-05-28 LAB — BASIC METABOLIC PANEL
Anion gap: 8 (ref 5–15)
BUN: 7 mg/dL (ref 6–20)
CO2: 24 mmol/L (ref 22–32)
Calcium: 8.5 mg/dL — ABNORMAL LOW (ref 8.9–10.3)
Chloride: 100 mmol/L (ref 98–111)
Creatinine, Ser: 0.72 mg/dL (ref 0.44–1.00)
GFR, Estimated: >60 mL/min (ref >60)
Glucose, Bld: 275 mg/dL — ABNORMAL HIGH (ref 70–99)
Potassium: 3.5 mmol/L (ref 3.5–5.1)
Sodium: 132 mmol/L — ABNORMAL LOW (ref 135–145)

## 2020-05-28 LAB — LIPID PANEL
Cholesterol: 75 mg/dL (ref 0–200)
HDL: 26 mg/dL — ABNORMAL LOW (ref >40)
LDL Cholesterol: 15 mg/dL (ref 0–99)
Total CHOL/HDL Ratio: 2.9 RATIO
Triglycerides: 170 mg/dL — ABNORMAL HIGH (ref <150)
VLDL: 34 mg/dL (ref 0–40)

## 2020-05-28 LAB — HEMOGLOBIN A1C
Hgb A1c MFr Bld: 9.7 % — ABNORMAL HIGH (ref 4.8–5.6)
Mean Plasma Glucose: 231.69 mg/dL

## 2020-05-28 LAB — GLUCOSE, CAPILLARY
Glucose-Capillary: 263 mg/dL — ABNORMAL HIGH (ref 70–99)
Glucose-Capillary: 225 mg/dL — ABNORMAL HIGH (ref 70–99)
Glucose-Capillary: 255 mg/dL — ABNORMAL HIGH (ref 70–99)
Glucose-Capillary: 326 mg/dL — ABNORMAL HIGH (ref 70–99)
Glucose-Capillary: 234 mg/dL — ABNORMAL HIGH (ref 70–99)
Glucose-Capillary: 260 mg/dL — ABNORMAL HIGH (ref 70–99)

## 2020-05-28 IMAGING — MR MR HEAD W/O CM
12 of 13 series · 43 of 48 positions shown · non-contrast
Comparison: Noncontrast head CT and CT angiogram head/neck as well
as CT perfusion head [DATE].

CLINICAL DATA: Stroke, follow-up.

EXAM:
MRI HEAD WITHOUT CONTRAST
TECHNIQUE: Multiplanar, multiecho pulse sequences of the brain and surrounding
structures were obtained without intravenous contrast.

[Series 5: DWI · axial · 3.0mm · 0.88mm/px · z∈[-106,+38]mm · 7 of 100 slices shown (1 of 4)]
[im 1/100]
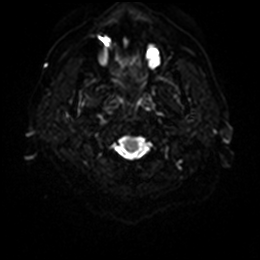
[im 17/100]
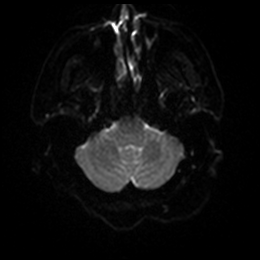
[im 34/100]
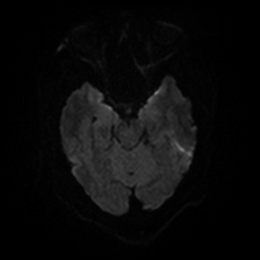
[im 50/100]
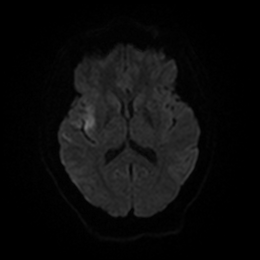
[im 67/100]
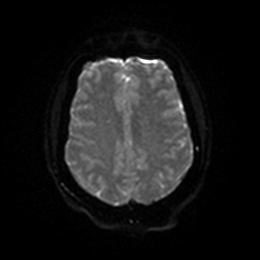
[im 83/100]
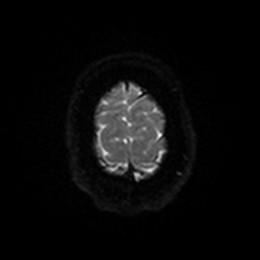
[im 100/100]
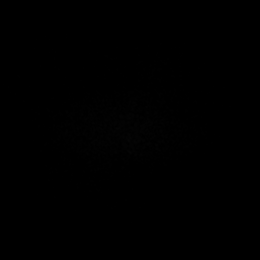

[Series 6: DWI · axial · 3.0mm · 0.88mm/px · z∈[-106,+38]mm · 3 of 50 slices shown (2 of 4)]
[im 1/50]
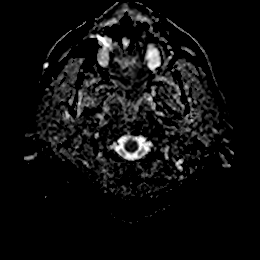
[im 25/50]
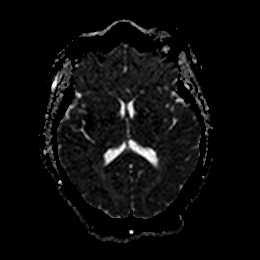
[im 50/50]
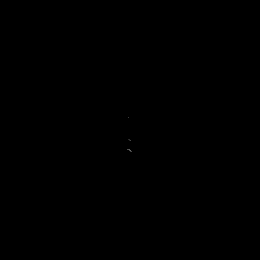

[Series 7: DWI · coronal · 4.0mm · 0.88mm/px · 5 of 64 slices shown (3 of 4)]
[im 1/64]
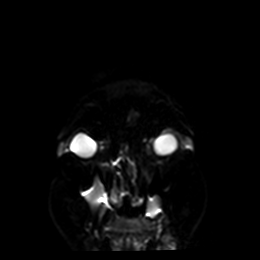
[im 16/64]
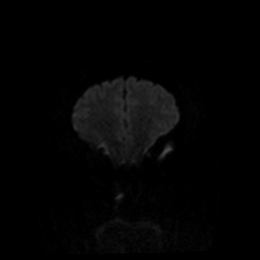
[im 32/64]
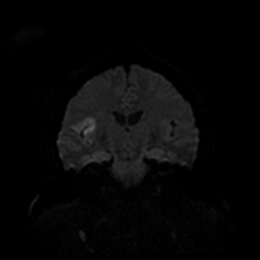
[im 48/64]
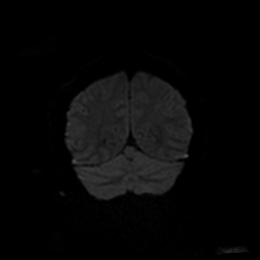
[im 64/64]
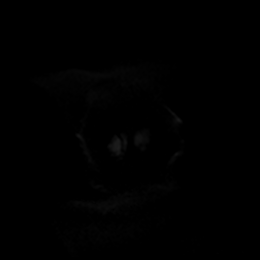

[Series 8: DWI · coronal · 4.0mm · 0.88mm/px · 2 of 32 slices shown (4 of 4)]
[im 1/32]
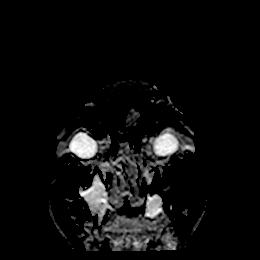
[im 32/32]
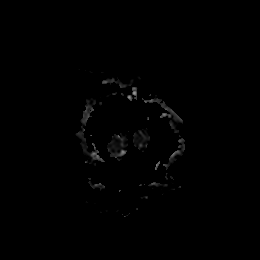

[Series 9: T1 · sagittal · 5.0mm · 0.75mm/px · 2 of 23 slices shown]
[im 1/23]
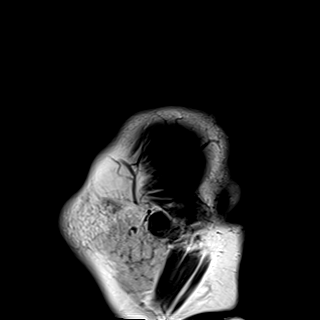
[im 23/23]
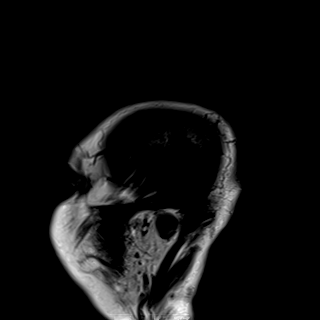

[Series 10: T2 · axial · 5.0mm · 0.72mm/px · z∈[-100,+42]mm · 2 of 25 slices shown (1 of 2)]
[im 1/25]
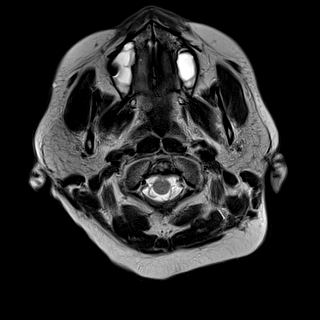
[im 25/25]
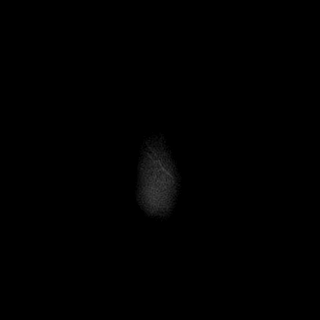

[Series 11: FLAIR · axial · 5.0mm · 0.45mm/px · z∈[-102,+40]mm · 2 of 25 slices shown]
[im 1/25]
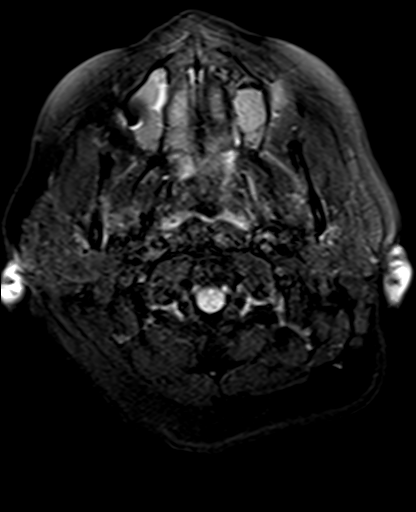
[im 25/25]
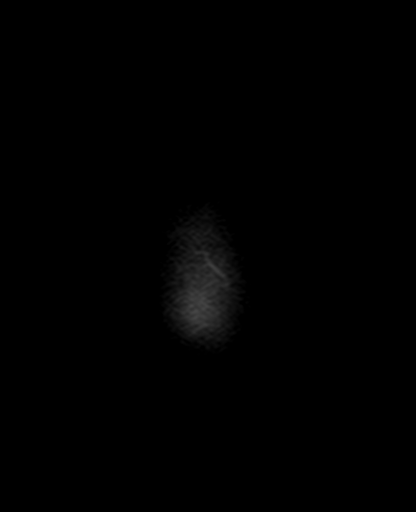

[Series 12: mag_images · axial · 3.0mm · 0.90mm/px · z∈[-119,+57]mm · 5 of 60 slices shown]
[im 1/60]
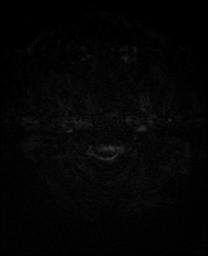
[im 15/60]
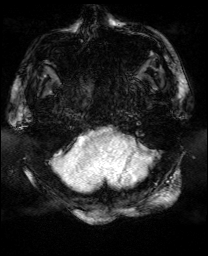
[im 30/60]
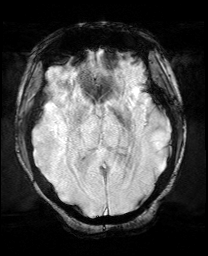
[im 45/60]
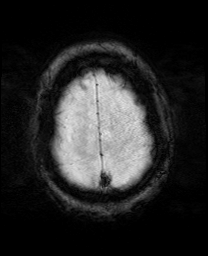
[im 60/60]
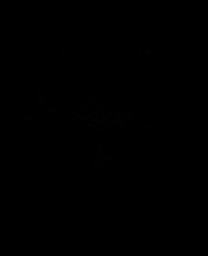

[Series 13: pha_images · axial · 3.0mm · 0.90mm/px · z∈[-119,+42]mm · 4 of 55 slices shown]
[im 1/55]
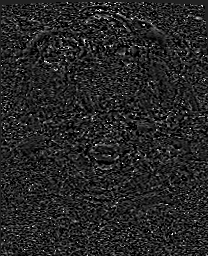
[im 19/55]
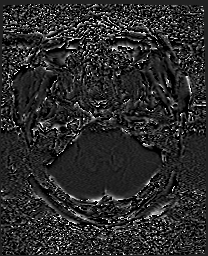
[im 37/55]
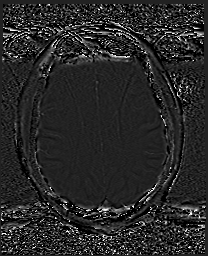
[im 55/55]
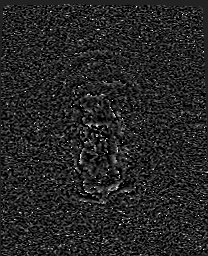

[Series 14: swi_images · axial · 3.0mm · 0.90mm/px · z∈[-119,+57]mm · 5 of 60 slices shown]
[im 1/60]
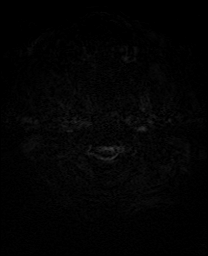
[im 15/60]
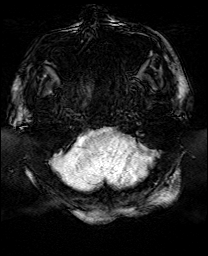
[im 30/60]
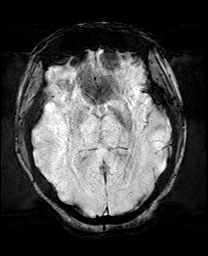
[im 45/60]
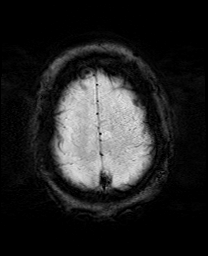
[im 60/60]
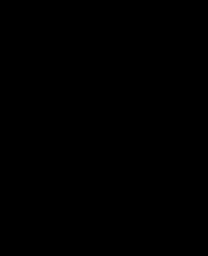

[Series 15: mip_images(sw) · axial · 24.0mm · 0.90mm/px · z∈[-108,+46]mm · 4 of 53 slices shown]
[im 1/53]
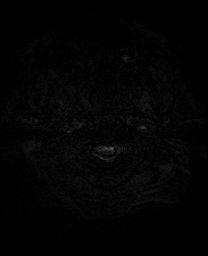
[im 18/53]
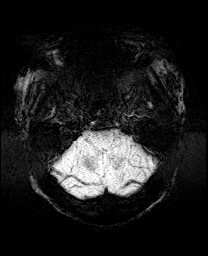
[im 35/53]
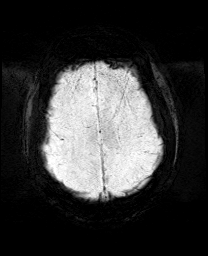
[im 53/53]
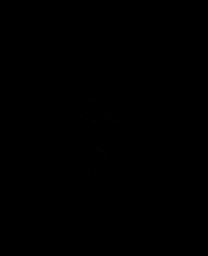

[Series 17: T2 · coronal · 5.0mm · 0.34mm/px · 2 of 29 slices shown (2 of 2)]
[im 1/29]
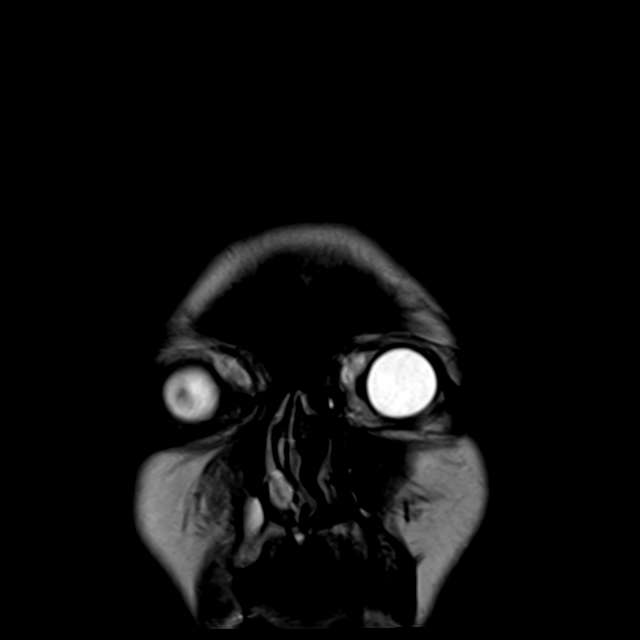
[im 29/29]
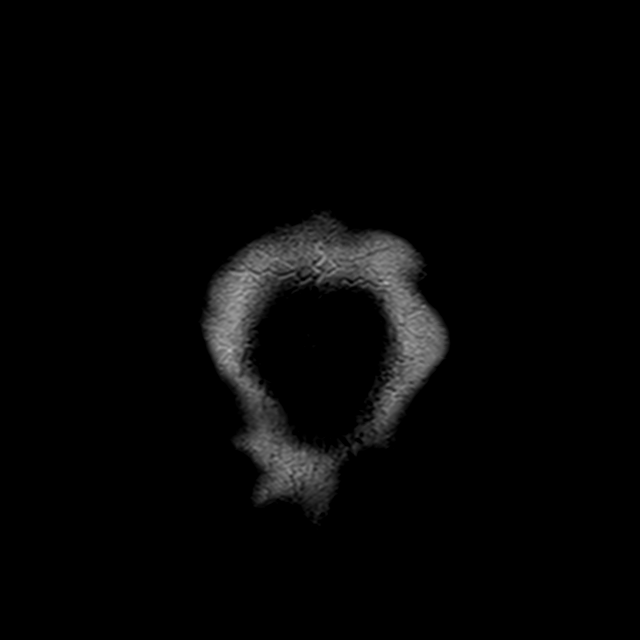

[43 of 48 positions shown; findings below may reference images not displayed]

FINDINGS: Brain:

Cerebral volume is normal.

Cortical restricted diffusion within the anteromedial right temporal
lobe, right insula and within small portions of the right
frontoparietal operculum compatible with acute infarction. A few
additional punctate cortical and subcortical acute infarcts are
present within the right frontal and parietal lobes. A small focus
of apparent restricted diffusion within the paramedian left parietal
lobe is favored to reflect artifact from adjacent dural
calcifications. No evidence of hemorrhagic conversion. No
significant mass effect

No evidence of intracranial mass.

No chronic intracranial blood products.

No extra-axial fluid collection.

No midline shift.

Partially empty sella turcica.

As before, the cerebellar tonsils extend 5 mm below the level of the
foramen magnum. Associated mild crowding at the level of the foramen
magnum.

Vascular: Expected proximal arterial flow voids.

Skull and upper cervical spine: No focal marrow lesion.

Sinuses/Orbits: Visualized orbits show no acute finding. Mild
partial opacification of the left greater than right ethmoid air
cells. Mucous retention cysts within the bilateral maxillary sinuses
IMPRESSION: Acute infarcts within the right MCA and MCA/ACA and MCA/PCA
watershed territories, as described and predominantly affecting the
anteromedial right temporal lobe, right insula and small portions of
the right frontoparietal operculum. No hemorrhagic conversion. No
significant mass effect.

As before, the cerebellar tonsils extend 5 mm below the level of the
foramen magnum. Associated mild crowding at the level of the foramen
magnum. This may reflect a mild Chiari I malformation.
Alternatively, cerebellar tonsillar ectopia may be seen in the
setting of idiopathic intracranial hypertension (pseudotumor
cerebri) or intracranial hypotension. Notably, there is a partially
empty sella turcica, a finding which may also be seen in setting of
intracranial hypertension.

Paranasal sinus disease as described.

## 2020-05-28 MED ORDER — INSULIN ASPART 100 UNIT/ML ~~LOC~~ SOLN
4.0000 [IU] | Freq: Three times a day (TID) | SUBCUTANEOUS | Status: DC
  Administered 2020-05-28 – 2020-05-30 (×5): 4 [IU] via SUBCUTANEOUS

## 2020-05-28 MED ORDER — OXYCODONE HCL 5 MG PO TABS
5.0000 mg | ORAL_TABLET | Freq: Once | ORAL | Status: AC
Start: 2020-05-29 — End: 2020-05-28
  Administered 2020-05-28: 5 mg via ORAL
  Filled 2020-05-28: qty 1

## 2020-05-28 MED ORDER — ASPIRIN EC 81 MG PO TBEC
81.0000 mg | DELAYED_RELEASE_TABLET | Freq: Every day | ORAL | Status: DC
  Administered 2020-05-28 – 2020-05-30 (×3): 81 mg via ORAL
  Filled 2020-05-28 (×3): qty 1

## 2020-05-28 MED ORDER — INSULIN GLARGINE 100 UNIT/ML ~~LOC~~ SOLN
25.0000 [IU] | Freq: Every day | SUBCUTANEOUS | Status: DC
  Administered 2020-05-28 – 2020-05-30 (×3): 25 [IU] via SUBCUTANEOUS
  Filled 2020-05-28 (×4): qty 0.25

## 2020-05-28 MED ORDER — PERFLUTREN LIPID MICROSPHERE
1.0000 mL | INTRAVENOUS | Status: AC | PRN
  Administered 2020-05-28: 5 mL via INTRAVENOUS
  Filled 2020-05-28: qty 10

## 2020-05-28 MED ORDER — METOPROLOL TARTRATE 50 MG PO TABS
50.0000 mg | ORAL_TABLET | Freq: Two times a day (BID) | ORAL | Status: DC
  Administered 2020-05-28 – 2020-05-30 (×5): 50 mg via ORAL
  Filled 2020-05-28 (×5): qty 1

## 2020-05-28 MED ORDER — INSULIN ASPART 100 UNIT/ML ~~LOC~~ SOLN
0.0000 [IU] | Freq: Three times a day (TID) | SUBCUTANEOUS | Status: DC
  Administered 2020-05-28: 15 [IU] via SUBCUTANEOUS
  Administered 2020-05-28: 11 [IU] via SUBCUTANEOUS
  Administered 2020-05-29: 15 [IU] via SUBCUTANEOUS
  Administered 2020-05-29: 11 [IU] via SUBCUTANEOUS
  Administered 2020-05-29 (×2): 4 [IU] via SUBCUTANEOUS
  Administered 2020-05-30: 11 [IU] via SUBCUTANEOUS
  Administered 2020-05-30: 4 [IU] via SUBCUTANEOUS

## 2020-05-28 MED ORDER — PANTOPRAZOLE SODIUM 40 MG PO TBEC
40.0000 mg | DELAYED_RELEASE_TABLET | Freq: Every day | ORAL | Status: DC
  Administered 2020-05-28 – 2020-05-29 (×2): 40 mg via ORAL
  Filled 2020-05-28 (×2): qty 1

## 2020-05-28 NOTE — Progress Notes (Signed)
    [facility] has been requested to perform a transesophageal echocardiogram on Christina [name] for stroke.  After careful review of history and examination, the risks and benefits of transesophageal echocardiogram have been explained including risks of esophageal damage, perforation (1:10,000 risk), bleeding, pharyngeal hematoma as well as other potential complications associated with conscious sedation including aspiration, arrhythmia, respiratory failure and death. Alternatives to treatment were discussed, questions were answered. Patient is willing to proceed.   Pt scheduled for TEE tomorrow at noon with Dr. Bjorn Pippin. NPO at MN please.  Christina [name], [place]  05/28/2020 4:50 PM

## 2020-05-28 NOTE — Progress Notes (Addendum)
STROKE TEAM PROGRESS NOTE  Briefly   Christina [name] is a 40 y.o. female who presented to the ED with left upper extremity weakness, facial droop, very mild neglect of the left side, very subtle gaze preference, and beginning to have left-sided numbness as well. Code IR activated for a right M1 stenosis. tPa was given.  She was taken for emergent mechanical thrombectomy of the right M1 withTICi 3 revascularization  INTERVAL HISTORY No acute events overnight 2D echo is pending, UDS is pending She is smoking about a pack every two days. She snores when she sleeps and does not sleep well.   We discussed her stroke diagnosis, plan of care, ongoing work up. We discussed OSA study. Questions were addressed. Sister and brother were at bedside.  MRI scan of the brain shows small right periinsular cortex infarct without any hemorrhagic transformation. Vitals:   05/28/20 0700 05/28/20 0730 05/28/20 0800 05/28/20 0818  BP: 113/79 120/76 102/63   Pulse: 94 86 81   Resp: 20 (!) 21 17   Temp:   99.4 F (37.4 C)   TempSrc:   Oral   SpO2: 97% 94% 93% 95%  Weight:       CBC:  Recent Labs  Lab 05/27/20 1433 05/28/20 0505  WBC 11.4* 13.5*  NEUTROABS 8.2* 10.0*  HGB 14.5 12.9  HCT 42.7 38.4  MCV 92.0 92.5  PLT 359 314   Basic Metabolic Panel:  Recent Labs  Lab 05/27/20 2253 05/28/20 0505  NA 133* 132*  K 3.8 3.5  CL 100 100  CO2 19* 24  GLUCOSE 300* 275*  BUN 8 7  CREATININE 0.93 0.72  CALCIUM 8.8* 8.5*   Lipid Panel:  Recent Labs  Lab 05/28/20 0505  CHOL 75  TRIG 170*  HDL 26*  CHOLHDL 2.9  VLDL 34  LDLCALC 15   HgbA1c:  Recent Labs  Lab 05/28/20 0505  HGBA1C 9.7*   Urine Drug Screen: RN to send  Alcohol Level  Recent Labs  Lab 05/27/20 1433  ETH <10   IMAGING and pertinent diagnostics   CT Head Code Stroke  1. No acute finding. 2. 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  CTA Head  1. Right M1 occlusion. No detected embolic source in the neck, although suboptimal CTA quality due to motion and soft tissue attenuation. 2. Narrowing of the left vertebral artery at the level of C2 and V4, consider injection at catheter angiogram to exclude FMD or Dissection.  MRI Acute infarcts within the right MCA and MCA/ACA and MCA/PCA watershed territories, as described and predominantly affecting the anteromedial right temporal lobe, right insula and small portions of the right frontoparietal operculum. No hemorrhagic conversion. No significant mass effect.  As before, the cerebellar tonsils extend 5 mm below the level of theforamen magnum. Associated mild crowding at the level of the foramenmagnum. 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 Alternatively, cerebellar tonsillar ectopia may be seen in the setting of idiopathic intracranial hypertension (pseudotumor cerebri) or intracranial hypotension. Notably, there is a partially empty sella turcica, a finding which may also be seen in setting of intracranial hypertension. Paranasal sinus disease    PHYSICAL EXAM Physical Exam  Constitutional: Obese young African-American lady not in distress.  Psych: Affect appropriate to situation, calm and cooperative  Eyes: No scleral injection HENT: No oropharyngeal obstruction.  MSK: no joint deformities.  Cardiovascular: Normal rate and regular rhythm.  Respiratory: Effort normal, non-labored breathing GI: Soft.  No distension. There is no tenderness.  Skin: Warm dry and intact visible skin Neuro: Mental Status: Patient is awake, alert, oriented to person, place, month, year, and situation. Patient is able to give a clear and coherent history. No signs of aphasia or neglect initially, but then on later evaluation had some neglect to double sided stimulation on the left side as well as developing a right gaze preference Cranial  Nerves: II: Visual Fields are full   Pupils are equal, round, and reactive to light.   III,IV, VI: EOMI without ptosis or diploplia.  V: Facial sensation is reduced on the right. VII: Facial movement is reduced on the left. VIII: hearing is intact to voice  X: Uvula elevates symmetrically XI: Shoulder shrug is symmetric. XII: tongue is midline without atrophy or fasciculations.  Motor: Tone is normal. Bulk is normal.   Subtle left upper extremity drift.  Weakness of left grip and intrinsic hand muscles.  Orbits right over left upper extremity. No drift of any of the other 3 extremities.  On repeat testing left grip strength is also weaker than the right Sensory: Initially was reporting a change in sensation on the right face arm and leg, but then later reported increasing left arm numbness Cerebellar: FNF and HKS are intact bilaterally  ASSESSMENT/PLAN Christina [name] is a 39 y.o. female with a past medical history significant for diabetes, hypertension, hyperlipidemia, obesity, asthma, smoking presenting with acute onset left-sided weakness and right-sided sensory changes with MRI showing stroke predominantly affecting the anteromedial right temporal lobe, right insula and small portions of the right frontoparietal operculum.   Cryptogenic acute infarcts within the right MCA and MCA/ACA and MCA/PCA watershed territories with M1 stenosis s/p tPA and thrombectomy with complete revascularization of occluded RT MCA M 1 achieving a TICI 3 revascularization.   CTA: right M1 stenosis.  2D Echo PENDING  Bilat LE dopplers are PENDING  Hypercoag labs are PENDING  TEE PENDING  VTE prophylaxis - Lovenox     Diet   Diet heart healthy/carb modified Room service appropriate? Yes; Fluid consistency: Thin     Therapy recommendations:  TBD  Disposition:  TBD  Hypertension  Stable off cleviprex  . Permissive hypertension (OK if < 220/120) but gradually normalize in 5-7 days . Long-term  BP goal normotensive  Hyperlipidemia  Home meds:  Crestor 20mg  LDL 15, at goal < 70  High intensity statin continued   Continue statin at discharge  Diabetes type II: Uncontrolled  Diabetes nurse consulted   HgbA1c 9.7, goal < 7.0  CBGs Recent Labs    05/27/20 2346 05/28/20 0350 05/28/20 0748  GLUCAP 266* 263* 225*      SSI TID and HS  Lantus 25 units daily and 4 units novolog with meals added   Hyponatremia  Na 132-129-133-132  Monitor   Asymptomatic leukocytosis  7->11->13  Afebrile  Likely reactive  Monitor   Other Stroke Risk Factors    Cigarette smoker and she was advised to stop smoking  ETOH use, alcohol level <10, advised to drink no more than 1drink(s) a day  UDS is pending  Obesity, Body mass index is 43.56 kg/m., BMI >/= 30 associated with increased stroke risk, recommend weight loss, diet and exercise as appropriate   Possible Obstructive sleep apnea: may enroll in study  Echo is pending   Hospital day # 1  I have personally obtained history,examined this patient, reviewed notes, independently viewed imaging studies,   participated in medical decision making and plan of care.ROS completed by me personally and pertinent positives fully documented  I have made any additions or clarifications directly to the above note. Agree with note above.  She presented with right MCA occlusion and was treated with IV tPA followed by successful mechanical thrombectomy and has clinically done well.  Continue close neurological monitoring and strict blood pressure control as per post tPA and thrombectomy protocol.  Mobilize out of bed.  Therapy consults.  Check urine drug screen and 2D echo.  She will likely need TEE for cardiac source of embolism and lower extremity venous Dopplers for DVT.  Patient counseled to quit smoking and is agreeable.  Aspirin for stroke prevention for now.  Long discussion with patient, sister and brother at the bedside and  answered questions. This patient is critically ill and at significant risk of neurological worsening, death and care requires constant monitoring of vital signs, hemodynamics,respiratory and cardiac monitoring, extensive review of multiple databases, frequent neurological assessment, discussion with family, other specialists and medical decision making of high complexity.I have made any additions or clarifications directly to the above note.This critical care time does not reflect procedure time, or teaching time or supervisory time of PA/NP/Med Resident etc but could involve care discussion time.  I spent 30 minutes of neurocritical care time  in the care of  this patient.      Christina Heady, MD Medical Director [facility] Pager: [phone] 05/28/2020 3:06 PM   To contact Stroke Continuity provider, please refer to [email or web address]. After hours, contact General Neurology

## 2020-05-28 NOTE — Progress Notes (Signed)
Lower extremity venous bilateral study completed.  Preliminary results relayed to T.K. RN.  See CV Proc for preliminary results report.   Jean Rosenthal, RDMS, RVT

## 2020-05-28 NOTE — Evaluation (Signed)
Physical Therapy Evaluation Patient Details Name: Christina [name] MRN: [id number] DOB: [birth year] Today's Date: 05/28/2020   History of Present Illness  Pt is a 40 y.o. F who presents with acute onset left sided weakness and right sided sensory changes. CT negative for acute intracranial process. CTA showed right M1 stenosis. Pt receieved tPA and revascularization of occluded R MCA M1.   Significant PMH: diabetes, HTN, HLD, obesity, asthma, smoking.  Clinical Impression  Patient evaluated by Physical Therapy with no further acute PT needs identified. Pt ambulating x 600 feet with no assistive device and negotiated 12 steps without physical difficulty. Scoring 24/24 on the Dynamic Gait Index, indicating she is not at high risk for falls. Education provided regarding BEFAST stroke symptoms, exercise recommendations, smoking cessation. All education has been completed and the patient has no further questions. No follow-up Physical Therapy or equipment needs. PT is signing off. Thank you for this referral.     Follow Up Recommendations No PT follow up    Equipment Recommendations  None recommended by PT    Recommendations for Other Services       Precautions / Restrictions Precautions Precautions: None Restrictions Weight Bearing Restrictions: No      Mobility  Bed Mobility Overal bed mobility: Modified Independent             General bed mobility comments: HOB elevated    Transfers Overall transfer level: Independent Equipment used: None                Ambulation/Gait Ambulation/Gait assistance: Modified independent (Device/Increase time) Gait Distance (Feet): 600 Feet Assistive device: None Gait Pattern/deviations: Step-through pattern;Decreased stride length;Wide base of support     General Gait Details: Slower pace for age, pt reporting baseline  Stairs Stairs: Yes Stairs assistance: Modified independent (Device/Increase time) Stair Management: One rail  Left Number of Stairs: 12 General stair comments: Step over step pattern  Wheelchair Mobility    Modified Rankin (Stroke Patients Only) Modified Rankin (Stroke Patients Only) Pre-Morbid Rankin Score: No symptoms Modified Rankin: No significant disability     Balance Overall balance assessment: No apparent balance deficits (not formally assessed)                               Standardized Balance Assessment Standardized Balance Assessment : Dynamic Gait Index   Dynamic Gait Index Level Surface: Normal Change in Gait Speed: Normal Gait with Horizontal Head Turns: Normal Gait with Vertical Head Turns: Normal Gait and Pivot Turn: Normal Step Over Obstacle: Normal Step Around Obstacles: Normal Steps: Normal Total Score: 24       Pertinent Vitals/Pain Pain Assessment: No/denies pain Faces Pain Scale: No hurt    Home Living Family/patient expects to be discharged to:: Private residence Living Arrangements: Spouse/significant other;Other relatives;Children (brother, 3 children) Available Help at Discharge: Family Type of Home: House Home Access: Stairs to enter Entrance Stairs-Rails: Lawyer of Steps: 6 Home Layout: One level Home Equipment: None Additional Comments: 40 y.o., 40 y.o., 38 y.o. (at college)    Prior Function Level of Independence: Independent         Comments: Works as Web designer: Right    Extremity/Trunk Assessment   Upper Extremity Assessment Upper Extremity Assessment: Defer to OT evaluation    Lower Extremity Assessment Lower Extremity Assessment: RLE deficits/detail;LLE deficits/detail RLE Deficits / Details: Strength 5/5 LLE Deficits / Details: Strength  5/5    Cervical / Trunk Assessment Cervical / Trunk Assessment: Other exceptions Cervical / Trunk Exceptions: increased body habitus  Communication   Communication: No difficulties  Cognition  Arousal/Alertness: Awake/alert Behavior During Therapy: WFL for tasks assessed/performed Overall Cognitive Status: Within Functional Limits for tasks assessed                                 General Comments: distracted at times however most likely close to baseline      General Comments      Exercises     Assessment/Plan    PT Assessment Patent does not need any further PT services  PT Problem List         PT Treatment Interventions      PT Goals (Current goals can be found in the Care Plan section)  Acute Rehab PT Goals Patient Stated Goal: return home PT Goal Formulation: All assessment and education complete, DC therapy    Frequency     Barriers to discharge        Co-evaluation               AM-PAC PT "6 Clicks" Mobility  Outcome Measure Help needed turning from your back to your side while in a flat bed without using bedrails?: None Help needed moving from lying on your back to sitting on the side of a flat bed without using bedrails?: None Help needed moving to and from a bed to a chair (including a wheelchair)?: None Help needed standing up from a chair using your arms (e.g., wheelchair or bedside chair)?: None Help needed to walk in hospital room?: None Help needed climbing 3-5 steps with a railing? : None 6 Click Score: 24    End of Session   Activity Tolerance: Patient tolerated treatment well Patient left: in chair;with call bell/phone within reach Nurse Communication: Mobility status PT Visit Diagnosis: Other symptoms and signs involving the nervous system (R29.898)    Time: 0623-7628 PT Time Calculation (min) (ACUTE ONLY): 20 min   Charges:   PT Evaluation $PT Eval Moderate Complexity: 1 Mod          Lillia Pauls, PT, DPT Acute Rehabilitation Services Pager [phone] Office [phone]   Norval Morton 05/28/2020, 12:31 PM

## 2020-05-28 NOTE — Progress Notes (Signed)
Pt's art-line waveform not accurate. RN going off cuff pressure to titrate drip for the rest of the shift.

## 2020-05-28 NOTE — Progress Notes (Signed)
Inpatient Diabetes Program Recommendations  AACE/ADA: New Consensus Statement on Inpatient Glycemic Control (2015)  Target Ranges:  Prepandial:   less than 140 mg/dL      Peak postprandial:   less than 180 mg/dL (1-2 hours)      Critically ill patients:  140 - 180 mg/dL   Lab Results  Component Value Date   GLUCAP 255 (H) 05/28/2020   HGBA1C 9.7 (H) 05/28/2020    Review of Glycemic Control Results for BONNELL, PLACZEK (MRN [id number]) as of 05/28/2020 13:07  Ref. Range 05/27/2020 19:55 05/27/2020 23:46 05/28/2020 03:50 05/28/2020 07:48 05/28/2020 11:46  Glucose-Capillary Latest Ref Range: 70 - 99 mg/dL 409 (H) 811 (H) 914 (H) 225 (H) 255 (H)   Diabetes history: DM2 Outpatient Diabetes medications:  Lantus 32 units q HS, Januvia 100 mg daily, Metformin XR 1000 mg daily, Ozempic 0.75 weekly Current orders for Inpatient glycemic control:  Novolog resistant q 4 hours Inpatient Diabetes Program Recommendations:   Please add Lantus 25 units daily. Change Novolog correction to tid with meals and HS and add Novolog 4 units tid with meals.   Will follow.   Thanks,  Beryl Meager, RN, BC-ADM Inpatient Diabetes Coordinator Pager [phone] (8a-5p)

## 2020-05-28 NOTE — Progress Notes (Signed)
Occupational Therapy Evaluation  PTA pt lives independently with her husband and 3 children (1, 39 and 38 yo boys) and works as a Patent attorney. Pt states she feels she is "about 75%". During ADL tasks, pt appears to demonstrate possible mild inattention to L and deficits with higher level executive cognitive skills. 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 Will follow acutely.     05/28/20 1100  OT Visit Information  Last OT Received On 05/28/20  Assistance Needed +1  History of Present Illness Pt is a 40 y.o. F who presents with acute onset left sided weakness and right sided sensory changes while visiting her son in the hospital. CT negative for acute intracranial process. CTA showed right M1 stenosis. Pt receieved tPA and revascularization of occluded R MCA M1 4/24.  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  Precautions  Precautions None  Restrictions  Weight Bearing Restrictions No  Home Living  Family/patient expects to be discharged to: Private residence  Living Arrangements Spouse/significant other;Other relatives;Children (brother, 3 children)  Available Help at Discharge Family  Type of Home House  Home Access Stairs to enter  Entrance Stairs-Number of Steps 6  Entrance Stairs-Rails Left;Right  Home Layout One level  Bathroom Shower/Tub Tub/shower unit  Tour manager None  Additional Comments 40 y.o., 38 y.o., 40 y.o. (at college)  Prior Function  Level of Independence Independent  Comments Works as Clinical cytogeneticist No difficulties  Pain Assessment  Pain  Assessment No/denies pain  Cognition  Arousal/Alertness Awake/alert  Behavior During Therapy WFL for tasks assessed/performed  Overall Cognitive Status Impaired/Different from baseline  Area of Impairment Attention;Awareness  Current Attention Level Selective  Awareness Anticipatory  General Comments distracted at times; able to complete complex 3 step trail making task in moderately distracting envrionment; while bathing, only washed R side of buttocks area  Upper Extremity Assessment  Upper Extremity Assessment LUE deficits/detail  LUE Deficits / Details strength adn ROM grossly WFL; "clumsy" at times; pt reports feeling her arm is @ 75% normal  Lower Extremity Assessment  Lower Extremity Assessment Defer to PT evaluation  Cervical / Trunk Assessment  Cervical / Trunk Assessment Other exceptions  Cervical / Trunk Exceptions increased body habitus  ADL  Overall ADL's  Needs assistance/impaired  Grooming Supervision/safety;Set up;Standing  Upper Body Bathing Supervision/ safety;Set up;Standing  Lower Body Bathing Supervison/ safety;Set up;Sit to/from stand  Upper Body Dressing  Sitting;Supervision/safety  Lower Body Dressing Sit to/from stand;Supervision/safety  Toilet Transfer Modified Independent  Toileting- Clothing Manipulation and Hygiene Modified independent  Functional mobility during ADLs Modified independent  Vision- History  Baseline Vision/History No visual deficits  Vision- Assessment  Vision Assessment? Yes  Eye Alignment WFL  Ocular Range of Motion [facility]  Alignment/Gaze Preference WDL  Tracking/Visual Pursuits Able to track stimulus in all quads without difficulty  Saccades WFL  Convergence WFL  Visual Fields No apparent deficits  Perception  Comments appears normal however pt demonstrates questionable inattention at times  Praxis  Praxis tested? WFL  Bed Mobility  Overal bed mobility Modified Independent  General bed mobility comments HOB elevated  Transfers  Overall transfer level Modified independent  Equipment used None  Balance  Overall balance assessment Mild deficits observed, not formally tested  OT - End of Session  Equipment Utilized During Treatment Gait belt  Activity Tolerance Patient tolerated treatment well  Patient left in chair;with call bell/phone within reach;with chair alarm set  Nurse Communication Mobility status  OT Assessment  OT Recommendation/Assessment Patient needs continued OT Services  OT Visit Diagnosis Other symptoms and signs involving cognitive function;Muscle weakness (generalized) (M62.81)  OT Problem List Decreased coordination;Decreased safety awareness;Obesity  OT Plan  OT Frequency (ACUTE ONLY) Min 2X/week  OT Treatment/Interventions (ACUTE ONLY) Self-care/ADL training;Therapeutic exercise;Neuromuscular education;Therapeutic activities;Cognitive remediation/compensation;Visual/perceptual remediation/compensation;Patient/family education  AM-PAC OT "6 Clicks" Daily Activity Outcome Measure (Version 2)  Help from another person eating meals? 4  Help from another person taking care of personal grooming? 3  Help from another person toileting, which includes using toliet, bedpan, or urinal? 4  Help from another person bathing (including washing, rinsing, drying)? 3  Help from another person to put on and taking off regular upper body clothing? 4  Help from another person to put on and taking off regular lower body clothing? 3  6 Click Score 21  OT Recommendation  Follow Up Recommendations Other (comment);Outpatient OT (neuro outpt OT)  OT Equipment None recommended by OT  Individuals Consulted  Consulted and Agree with Results and Recommendations Patient  Acute Rehab OT Goals  Patient Stated Goal return home adn to work  OT Goal Formulation With patient  Time For Goal Achievement 06/11/20  Potential to Achieve Goals Good  OT Time Calculation  OT Start Time (ACUTE ONLY) 1105  OT Stop Time (ACUTE  ONLY) 1137  OT Time Calculation (min) 32 min  OT General Charges  $OT Visit 1 Visit  OT Evaluation  $OT Eval Moderate Complexity 1 Mod  OT Treatments  $Self Care/Home Management  8-22 mins  Written Expression  Dominant Hand Right   Luisa Dago, OT/L   Acute OT Clinical Specialist Acute Rehabilitation Services Pager [phone] Office [phone]

## 2020-05-28 NOTE — Progress Notes (Signed)
Speech-Language-Cognitive Assessment     05/28/20 1021  SLP Visit Information  SLP Received On 05/28/20  SLP Time Calculation  SLP Start Time (ACUTE ONLY) 1024  SLP Stop Time (ACUTE ONLY) 1042  SLP Time Calculation (min) (ACUTE ONLY) 18 min  General Information  HPI 40 year old female on Pediatric unit at [facility] visiting her son who is admitted, sneezed and had sudden right facial droop, dysarthria. MRI acute infarcts within right MCA/ACA and PCA. Found to have occluded RT MCA M1 and underwent thrombectomy 4/24.  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)  Pain Assessment  Pain Assessment Faces  Faces Pain Scale 0  Oral Motor/Sensory Function  Overall Oral Motor/Sensory Function Mild impairment  Facial ROM Reduced left;Suspected CN VII (facial) dysfunction  Facial Symmetry Abnormal symmetry left;Suspected CN VII (facial) dysfunction  Facial Strength Reduced left;Suspected CN VII (facial) dysfunction  Facial Sensation Reduced left;Suspected CN V (Trigeminal) dysfunction  Lingual ROM WFL  Lingual Symmetry WFL  Lingual Strength WFL  Mandible WFL  Cognition  Overall Cognitive Status No family/caregiver present to determine baseline cognitive functioning (minimal)  Arousal/Alertness Awake/alert  Orientation Level Oriented X4  Attention Sustained  Sustained Attention  (attended to therapist- mild difficulty with alternating and divided with OT)  Memory Impaired  Memory Impairment Retrieval deficit  Awareness Appears intact  Problem Solving Appears intact (verbal)  Executive Function Organizing  Safety/Judgment Appears intact  Auditory Comprehension  Overall Auditory Comprehension Appears within functional limits for tasks assessed  Visual Recognition/Discrimination  Discrimination Not tested  Reading Comprehension  Reading Status Not tested  Expression   Primary Mode of Expression Verbal  Verbal Expression  Overall Verbal Expression Appears within functional limits for tasks assessed  Naming No impairment  Pragmatics No impairment  Written Expression  Dominant Hand Right  Written Expression Not tested  Motor Speech  Overall Motor Speech Impaired  Respiration WFL  Phonation Normal  Resonance WFL  Articulation  (minimally distorted)  Intelligibility Intelligible  Motor Planning [facility]  SLP - End of Session  Patient left in bed;Other (comment) (ped psychologist)  Nurse Communication Treatment plan  Assessment  Clinical Impression Statement (ACUTE ONLY) Pt seen for speech-language-cognitive assessment and given the SLUMS. Speech is minimally dysarthric, intelligible and improved as her alertness improved after SLP's arrival. She scored a 25/30 (mild cognitive deficits) with retrieval difficulty of 2/5 word recall and attention for stating numbers backward from dictation. She sustained her attention to therapist for verbal information however attention during verbal and activity during mobilization with OT/PT was somewhat more challenging. 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 She would benefit from continued therapy on acute and outpatient for executive functioning.  SLP Recommendation/Assessment Patient needs continued Speech Lanaguage Pathology Services  SLP Visit Diagnosis Cognitive communication deficit (R41.841);Dysarthria and anarthria (R47.1)  Problem List Reasoning;Executive Functioning;Attention;Memory  Plan  Speech Therapy Frequency (ACUTE ONLY) min 2x/week  Duration 2 weeks  Treatment/Interventions Compensatory strategies;SLP instruction and feedback;Patient/family education;Functional tasks;Cognitive reorganization  Potential to Achieve Goals (ACUTE ONLY) Good  SLP Recommendations  Follow up Recommendations Outpatient SLP  SLP Equipment None recommended by SLP  Individuals Consulted  Consulted and Agree with  Results and Recommendations Patient  SLP Goals  Progress/Goals/Alternative treatment plan discussed with pt/caregiver and they Agree  SLP Evaluations  $ SLP Speech Visit 1 Visit  SLP Evaluations  $ SLP EVAL LANGUAGE/SOUND PRODUCTION 1 Procedure  Misty Stanley  Anne Hahn [employer].Ed Nurse, children's [phone] Office [phone]

## 2020-05-28 NOTE — Progress Notes (Signed)
Referring Physician(s): CODE STROKE  Supervising Physician: Julieanne Cotton  Patient Status:  [facility] - In-pt  Chief Complaint: Acute right MCA stroke s/p tPA, s/p  thrombectomy 05/27/20 with Dr. Corliss Skains   Subjective: Awake and alert in bed eating breakfast and talking on the phone. No neuro deficits identified. She denied any pain or discomfort.   Follow up imaging obtained today MR Brain 05/28/20 IMPRESSION: 1. Acute infarcts within the right MCA and MCA/ACA and MCA/PCA watershed territories, as described and predominantly affecting the anteromedial right temporal lobe, right insula and small portions of the right frontoparietal operculum. No hemorrhagic conversion. No significant mass effect. 2. As before, the cerebellar tonsils extend 5 mm below the level of the foramen magnum. Associated mild crowding at the level of the foramen magnum. 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 Alternatively, cerebellar tonsillar ectopia may be seen in the setting of idiopathic intracranial hypertension (pseudotumor cerebri) or intracranial hypotension. Notably, there is a partially empty sella turcica, a finding which may also be seen in setting of intracranial hypertension. 3. Paranasal sinus disease as described.   Allergies: Peanut-containing drug products, Lisinopril, Penicillins, and Tomato  Medications: Prior to Admission medications   Medication Sig Start Date End Date Taking? Authorizing Provider  albuterol (PROVENTIL HFA;VENTOLIN HFA) 108 (90 BASE) MCG/ACT inhaler Inhale 2 puffs into the lungs every 6 (six) hours as needed for wheezing.    [provider]  BREO ELLIPTA 100-25 MCG/INH AEPB Inhale 1 puff into the lungs daily. 08/10/19   [provider]  fexofenadine (ALLEGRA) 180 MG tablet Take 180 mg by mouth daily.    [provider]  hydrochlorothiazide (HYDRODIURIL) 12.5 MG tablet Take 12.5 mg by mouth daily.    [provider]   hydrochlorothiazide (HYDRODIURIL) 25 MG tablet Take 25 mg by mouth daily. 03/28/20   [provider]  ibuprofen (ADVIL) 600 MG tablet Take 1 tablet (600 mg total) by mouth every 6 (six) hours as needed for moderate pain. 12/26/19   Sponseller, Lupe Carney R, PA-C  insulin glargine (LANTUS) 100 UNIT/ML Solostar Pen Inject 32 Units into the skin at bedtime. 06/28/19   [provider]  JANUVIA 100 MG tablet Take 100 mg by mouth daily. 08/11/19   [provider]  metFORMIN (GLUCOPHAGE-XR) 500 MG 24 hr tablet Take 1,000 mg by mouth daily with breakfast.    [provider]  methocarbamol (ROBAXIN) 500 MG tablet Take 1 tablet (500 mg total) by mouth 2 (two) times daily as needed for muscle spasms. 12/26/19   Sponseller, Lupe Carney R, PA-C  metoprolol tartrate (LOPRESSOR) 100 MG tablet Take 100 mg by mouth daily. 08/11/19   [provider]  ondansetron (ZOFRAN ODT) 4 MG disintegrating tablet Take 1 tablet (4 mg total) by mouth every 8 (eight) hours as needed for nausea or vomiting. 09/16/19   Alvira Monday, MD  OZEMPIC, 1 MG/DOSE, 4 MG/3ML SOPN Inject 0.75 mLs into the skin once a week. 05/18/20   [provider]  rosuvastatin (CRESTOR) 20 MG tablet Take 20 mg by mouth daily. 03/28/20   [provider]  rosuvastatin (CRESTOR) 40 MG tablet Take 40 mg by mouth daily. 08/11/19   [provider]  fluticasone (FLONASE) 50 MCG/ACT nasal spray Place 1 spray into both nostrils daily. 08/02/17 12/02/18  Maxwell Caul, PA-C     Vital Signs: BP 115/73   Pulse 88   Temp 99.4 F (37.4 C) (Oral)   Resp 19   Wt 295 lb (  133.8 kg)   SpO2 94%   BMI 43.56 kg/m   Physical Exam Constitutional:      General: She is not in acute distress.    Appearance: She is obese. She is not ill-appearing.  Cardiovascular:     Rate and Rhythm: Normal rate and regular rhythm.     Comments: Cardiac monitor. Right femoral vascular site level zero. Site is soft,  non-tender and the dressing is clean and dry.  Pulmonary:     Effort: Pulmonary effort is normal.  Musculoskeletal:        General: Normal range of motion.  Skin:    General: Skin is warm and dry.  Neurological:     Mental Status: She is alert and oriented to person, place, and time.     Cranial Nerves: No dysarthria or facial asymmetry.     Motor: No weakness.     Coordination: Finger-Nose-Finger Test normal.  Psychiatric:        Mood and Affect: Mood normal.        Behavior: Behavior normal.        Thought Content: Thought content normal.        Judgment: Judgment normal.     Imaging: MR BRAIN WO CONTRAST  Result Date: 05/28/2020 CLINICAL DATA:  Stroke, follow-up. EXAM: MRI HEAD WITHOUT CONTRAST TECHNIQUE: Multiplanar, multiecho pulse sequences of the brain and surrounding structures were obtained without intravenous contrast. COMPARISON:  Noncontrast head CT and CT angiogram head/neck as well as CT perfusion head 05/27/2020. FINDINGS: Brain: Cerebral volume is normal. Cortical restricted diffusion within the anteromedial right temporal lobe, right insula and within small portions of the right frontoparietal operculum compatible with acute infarction. A few additional punctate cortical and subcortical acute infarcts are present within the right frontal and parietal lobes. A small focus of apparent restricted diffusion within the paramedian left parietal lobe is favored to reflect artifact from adjacent dural calcifications. No evidence of hemorrhagic conversion. No significant mass effect No evidence of intracranial mass. No chronic intracranial blood products. No extra-axial fluid collection. No midline shift. Partially empty sella turcica. As before, the cerebellar tonsils extend 5 mm below the level of the foramen magnum. Associated mild crowding at the level of the foramen magnum. Vascular: Expected proximal arterial flow voids. Skull and upper cervical spine: No focal marrow lesion.  Sinuses/Orbits: Visualized orbits show no acute finding. Mild partial opacification of the left greater than right ethmoid air cells. Mucous retention cysts within the bilateral maxillary sinuses IMPRESSION: Acute infarcts within the right MCA and MCA/ACA and MCA/PCA watershed territories, as described and predominantly affecting the anteromedial right temporal lobe, right insula and small portions of the right frontoparietal operculum. No hemorrhagic conversion. No significant mass effect. As before, the cerebellar tonsils extend 5 mm below the level of the foramen magnum. Associated mild crowding at the level of the foramen magnum. 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 Alternatively, cerebellar tonsillar ectopia may be seen in the setting of idiopathic intracranial hypertension (pseudotumor cerebri) or intracranial hypotension. Notably, there is a partially empty sella turcica, a finding which may also be seen in setting of intracranial hypertension. Paranasal sinus disease as described. Electronically Signed   By: Jackey LogeKyle  Golden DO   On: 05/28/2020 10:29   CT HEAD CODE STROKE WO CONTRAST  Result Date: 05/27/2020 CLINICAL DATA:  Code stroke. Left-sided weakness and facial drooping, sudden onset after sneezing EXAM: CT HEAD WITHOUT CONTRAST TECHNIQUE: Contiguous axial images were obtained from the base of the  skull through the vertex without intravenous contrast. COMPARISON:  Head CT 07/11/2014 FINDINGS: Brain: There is prominent degree of streak artifact bilaterally. No evidence of acute infarction, hemorrhage, hydrocephalus, extra-axial collection or mass lesion/mass effect. Low cerebellar tonsils, 5-6 mm below the foramen magnum, unchanged from prior. There is foramen magnum crowding such that Chiari 1 malformation would be considered in the appropriate clinical setting. Vascular: No hyperdense vessel or unexpected calcification. Skull: Negative Sinuses/Orbits: Negative Other: Critical Value/emergent  results were called by telephone at the time of interpretation on 05/27/2020 at 10:43 am to Dr Iver Nestle, who verbally acknowledged these results. ASPECTS [facility] Stroke Program Early CT Score) - Ganglionic level infarction (caudate, lentiform nuclei, internal capsule, insula, M1-M3 cortex): 7 - Supraganglionic infarction (M4-M6 cortex): 3 Total score (0-10 with 10 being normal): 10 IMPRESSION: 1. No acute finding. 2. 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 Electronically Signed   By: Marnee Spring M.D.   On: 05/27/2020 10:46   CT ANGIO HEAD CODE STROKE  Result Date: 05/27/2020 CLINICAL DATA:  Acute stroke suspected. EXAM: CT ANGIOGRAPHY HEAD AND NECK TECHNIQUE: Multidetector CT imaging of the head and neck was performed using the standard protocol during bolus administration of intravenous contrast. Multiplanar CT image reconstructions and MIPs were obtained to evaluate the vascular anatomy. Carotid stenosis measurements (when applicable) are obtained utilizing NASCET criteria, using the distal internal carotid diameter as the denominator. CONTRAST:  Dose is currently not known COMPARISON:  Head CT from earlier the same day FINDINGS: CTA NECK FINDINGS Aortic arch: Unremarkable Right carotid system: Vessels appear smooth and widely patent. No noted dissection, ulceration, or generalized beading. Tortuous ICA. Left carotid system: Tortuous ICA and partial retropharyngeal course. No evidence of stenosis, beading, or aneurysm. Vertebral arteries: No proximal subclavian stenosis. Both vertebral arteries are patent to the dura. Mild but fairly smoothly contoured narrowing of the left vertebral artery at the C2 level. Skeleton: No acute finding Other neck: No visible soft tissue mass or inflammation. Upper chest: Negative Review of the MIP images confirms the above findings CTA HEAD FINDINGS Anterior  circulation: Limited by small vessels and soft tissue attenuation. Right M1 occlusion with faint downstream reconstitution. Pial collaterals are enhancing. No additional anterior circulation embolism is seen. No evidence of chronic stenosis. Posterior circulation: Small appearance of the left V4 segment but fairly smoothly contoured as permitted by technique and CTA quality. No branch occlusion is seen. Venous sinuses: Patent Anatomic variants: None significant Review of the MIP images confirms the above findings Critical Value/emergent results were called by telephone at the time of interpretation on 05/27/2020 at 11:04 am to provider [facility] , who verbally acknowledged these results. IMPRESSION: 1. Right M1 occlusion. No detected embolic source in the neck, although suboptimal CTA quality due to motion and soft tissue attenuation. 2. Narrowing of the left vertebral artery at the level of C2 and V4, consider injection at catheter angiogram to exclude FMD or dissection. Electronically Signed   By: Marnee Spring M.D.   On: 05/27/2020 11:08   CT ANGIO NECK CODE STROKE  Result Date: 05/27/2020 CLINICAL DATA:  Acute stroke suspected. EXAM: CT ANGIOGRAPHY HEAD AND NECK TECHNIQUE: Multidetector CT imaging of the head and neck was performed using the standard protocol during bolus administration of intravenous contrast. Multiplanar CT image reconstructions and MIPs were obtained to evaluate the vascular anatomy. Carotid stenosis measurements (when applicable) are obtained utilizing NASCET criteria, using the distal  internal carotid diameter as the denominator. CONTRAST:  Dose is currently not known COMPARISON:  Head CT from earlier the same day FINDINGS: CTA NECK FINDINGS Aortic arch: Unremarkable Right carotid system: Vessels appear smooth and widely patent. No noted dissection, ulceration, or generalized beading. Tortuous ICA. Left carotid system: Tortuous ICA and partial retropharyngeal course. No evidence  of stenosis, beading, or aneurysm. Vertebral arteries: No proximal subclavian stenosis. Both vertebral arteries are patent to the dura. Mild but fairly smoothly contoured narrowing of the left vertebral artery at the C2 level. Skeleton: No acute finding Other neck: No visible soft tissue mass or inflammation. Upper chest: Negative Review of the MIP images confirms the above findings CTA HEAD FINDINGS Anterior circulation: Limited by small vessels and soft tissue attenuation. Right M1 occlusion with faint downstream reconstitution. Pial collaterals are enhancing. No additional anterior circulation embolism is seen. No evidence of chronic stenosis. Posterior circulation: Small appearance of the left V4 segment but fairly smoothly contoured as permitted by technique and CTA quality. No branch occlusion is seen. Venous sinuses: Patent Anatomic variants: None significant Review of the MIP images confirms the above findings Critical Value/emergent results were called by telephone at the time of interpretation on 05/27/2020 at 11:04 am to provider [facility] , who verbally acknowledged these results. IMPRESSION: 1. Right M1 occlusion. No detected embolic source in the neck, although suboptimal CTA quality due to motion and soft tissue attenuation. 2. Narrowing of the left vertebral artery at the level of C2 and V4, consider injection at catheter angiogram to exclude FMD or dissection. Electronically Signed   By: Marnee Spring M.D.   On: 05/27/2020 11:08    Labs:  CBC: Recent Labs    09/16/19 1625 05/27/20 1050 05/27/20 1433 05/28/20 0505  WBC 7.4  --  11.4* 13.5*  HGB 14.2 16.0* 14.5 12.9  HCT 44.1 47.0* 42.7 38.4  PLT 296  --  359 314    COAGS: Recent Labs    05/27/20 1433  INR 1.1  APTT 25    BMP: Recent Labs    09/16/19 1625 05/27/20 1050 05/27/20 1433 05/27/20 2253 05/28/20 0505  NA 135 134* 129* 133* 132*  K 3.9 3.7 4.0 3.8 3.5  CL 98 93* 95* 100 100  CO2 27  --  21* 19* 24   GLUCOSE 188* 322* 382* 300* 275*  BUN 11 20 12 8 7   CALCIUM 8.6*  --  8.4* 8.8* 8.5*  CREATININE 0.88 0.80 0.93 0.93 0.72  GFRNONAA >60  --  >60 >60 >60  GFRAA >60  --   --   --   --     LIVER FUNCTION TESTS: Recent Labs    05/27/20 1433  BILITOT 0.6  AST 20  ALT 24  ALKPHOS 55  PROT 7.0  ALBUMIN 3.6    Assessment and Plan:  Acute right MCA stroke s/p thrombectomy 05/27/20:   No neuro deficits identified. Right femoral vascular access site is soft, non-tender with a dressing that is clean and dry. CT head pending.   Smoking cessation encouraged. Patient also encouraged to better control modifiable risk factors such as blood pressure and blood glucose levels.   Other plans per neurology/primary teams. Please call IR with any questions.   Electronically Signed: 05/29/20, AGACNP-BC [phone] 05/28/2020, 11:44 AM   I spent a total of 15 Minutes at the the patient's bedside AND on the patient's hospital floor or unit, greater than 50% of which was counseling/coordinating care for right MCA  stroke s/p thrombectomy

## 2020-05-28 NOTE — H&P (View-Only) (Signed)
STROKE TEAM PROGRESS NOTE  Briefly   Christina [name] is a 40 y.o. female who presented to the ED with left upper extremity weakness, facial droop, very mild neglect of the left side, very subtle gaze preference, and beginning to have left-sided numbness as well. Code IR activated for a right M1 stenosis. tPa was given.  She was taken for emergent mechanical thrombectomy of the right M1 withTICi 3 revascularization  INTERVAL HISTORY No acute events overnight 2D echo is pending, UDS is pending She is smoking about a pack every two days. She snores when she sleeps and does not sleep well.   We discussed her stroke diagnosis, plan of care, ongoing work up. We discussed OSA study. Questions were addressed. Sister and brother were at bedside.  MRI scan of the brain shows small right periinsular cortex infarct without any hemorrhagic transformation. Vitals:   05/28/20 0700 05/28/20 0730 05/28/20 0800 05/28/20 0818  BP: 113/79 120/76 102/63   Pulse: 94 86 81   Resp: 20 (!) 21 17   Temp:   99.4 F (37.4 C)   TempSrc:   Oral   SpO2: 97% 94% 93% 95%  Weight:       CBC:  Recent Labs  Lab 05/27/20 1433 05/28/20 0505  WBC 11.4* 13.5*  NEUTROABS 8.2* 10.0*  HGB 14.5 12.9  HCT 42.7 38.4  MCV 92.0 92.5  PLT 359 314   Basic Metabolic Panel:  Recent Labs  Lab 05/27/20 2253 05/28/20 0505  NA 133* 132*  K 3.8 3.5  CL 100 100  CO2 19* 24  GLUCOSE 300* 275*  BUN 8 7  CREATININE 0.93 0.72  CALCIUM 8.8* 8.5*   Lipid Panel:  Recent Labs  Lab 05/28/20 0505  CHOL 75  TRIG 170*  HDL 26*  CHOLHDL 2.9  VLDL 34  LDLCALC 15   HgbA1c:  Recent Labs  Lab 05/28/20 0505  HGBA1C 9.7*   Urine Drug Screen: RN to send  Alcohol Level  Recent Labs  Lab 05/27/20 1433  ETH <10   IMAGING and pertinent diagnostics   CT Head Code Stroke  1. No acute finding. 2. 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  CTA Head  1. Right M1 occlusion. No detected embolic source in the neck, although suboptimal CTA quality due to motion and soft tissue attenuation. 2. Narrowing of the left vertebral artery at the level of C2 and V4, consider injection at catheter angiogram to exclude FMD or Dissection.  MRI Acute infarcts within the right MCA and MCA/ACA and MCA/PCA watershed territories, as described and predominantly affecting the anteromedial right temporal lobe, right insula and small portions of the right frontoparietal operculum. No hemorrhagic conversion. No significant mass effect.  As before, the cerebellar tonsils extend 5 mm below the level of theforamen magnum. Associated mild crowding at the level of the foramenmagnum. 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 Alternatively, cerebellar tonsillar ectopia may be seen in the setting of idiopathic intracranial hypertension (pseudotumor cerebri) or intracranial hypotension. Notably, there is a partially empty sella turcica, a finding which may also be seen in setting of intracranial hypertension. Paranasal sinus disease    PHYSICAL EXAM Physical Exam  Constitutional: Obese young African-American lady not in distress.  Psych: Affect appropriate to situation, calm and cooperative  Eyes: No scleral injection HENT: No oropharyngeal obstruction.  MSK: no joint deformities.  Cardiovascular: Normal rate and regular rhythm.  Respiratory: Effort normal, non-labored breathing GI: Soft.  No distension. There is no tenderness.  Skin: Warm dry and intact visible skin Neuro: Mental Status: Patient is awake, alert, oriented to person, place, month, year, and situation. Patient is able to give a clear and coherent history. No signs of aphasia or neglect initially, but then on later evaluation had some neglect to double sided stimulation on the left side as well as developing a right gaze preference Cranial  Nerves: II: Visual Fields are full   Pupils are equal, round, and reactive to light.   III,IV, VI: EOMI without ptosis or diploplia.  V: Facial sensation is reduced on the right. VII: Facial movement is reduced on the left. VIII: hearing is intact to voice  X: Uvula elevates symmetrically XI: Shoulder shrug is symmetric. XII: tongue is midline without atrophy or fasciculations.  Motor: Tone is normal. Bulk is normal.   Subtle left upper extremity drift.  Weakness of left grip and intrinsic hand muscles.  Orbits right over left upper extremity. No drift of any of the other 3 extremities.  On repeat testing left grip strength is also weaker than the right Sensory: Initially was reporting a change in sensation on the right face arm and leg, but then later reported increasing left arm numbness Cerebellar: FNF and HKS are intact bilaterally  ASSESSMENT/PLAN [name] is a 40 y.o. female with a past medical history significant for diabetes, hypertension, hyperlipidemia, obesity, asthma, smoking presenting with acute onset left-sided weakness and right-sided sensory changes with MRI showing stroke predominantly affecting the anteromedial right temporal lobe, right insula and small portions of the right frontoparietal operculum.   Cryptogenic acute infarcts within the right MCA and MCA/ACA and MCA/PCA watershed territories with M1 stenosis s/p tPA and thrombectomy with complete revascularization of occluded RT MCA M 1 achieving a TICI 3 revascularization.   CTA: right M1 stenosis.  2D Echo PENDING  Bilat LE dopplers are PENDING  Hypercoag labs are PENDING  TEE PENDING  VTE prophylaxis - Lovenox     Diet   Diet heart healthy/carb modified Room service appropriate? Yes; Fluid consistency: Thin     Therapy recommendations:  TBD  Disposition:  TBD  Hypertension  Stable off cleviprex  . Permissive hypertension (OK if < 220/120) but gradually normalize in 5-7 days . Long-term  BP goal normotensive  Hyperlipidemia  Home meds:  Crestor 20mg   LDL 15, at goal < 70  High intensity statin continued   Continue statin at discharge  Diabetes type II: Uncontrolled  Diabetes nurse consulted   HgbA1c 9.7, goal < 7.0  CBGs Recent Labs    05/27/20 2346 05/28/20 0350 05/28/20 0748  GLUCAP 266* 263* 225*      SSI TID and HS  Lantus 25 units daily and 4 units novolog with meals added   Hyponatremia  Na 132-129-133-132  Monitor   Asymptomatic leukocytosis  7->11->13  Afebrile  Likely reactive  Monitor   Other Stroke Risk Factors    Cigarette smoker and she was advised to stop smoking  ETOH use, alcohol level <10, advised to drink no more than 1drink(s) a day  UDS is pending  Obesity, Body mass index is 43.56 kg/m., BMI >/= 30 associated with increased stroke risk, recommend weight loss, diet and exercise as appropriate   Possible Obstructive sleep apnea: may enroll in study  Echo is pending   Hospital day # 1  I have personally obtained history,examined this patient, reviewed notes, independently viewed imaging studies,  participated in medical decision making and plan of care.ROS completed by me personally and pertinent positives fully documented  I have made any additions or clarifications directly to the above note. Agree with note above.  She presented with right MCA occlusion and was treated with IV tPA followed by successful mechanical thrombectomy and has clinically done well.  Continue close neurological monitoring and strict blood pressure control as per post tPA and thrombectomy protocol.  Mobilize out of bed.  Therapy consults.  Check urine drug screen and 2D echo.  She will likely need TEE for cardiac source of embolism and lower extremity venous Dopplers for DVT.  Patient counseled to quit smoking and is agreeable.  Aspirin for stroke prevention for now.  Long discussion with patient, sister and brother at the bedside and  answered questions. This patient is critically ill and at significant risk of neurological worsening, death and care requires constant monitoring of vital signs, hemodynamics,respiratory and cardiac monitoring, extensive review of multiple databases, frequent neurological assessment, discussion with family, other specialists and medical decision making of high complexity.I have made any additions or clarifications directly to the above note.This critical care time does not reflect procedure time, or teaching time or supervisory time of PA/NP/Med Resident etc but could involve care discussion time.  I spent 30 minutes of neurocritical care time  in the care of  this patient.      Delia Heady, MD Medical Director [facility] Pager: [phone] 05/28/2020 3:06 PM   To contact Stroke Continuity provider, please refer to [email or web address]. After hours, contact General Neurology

## 2020-05-28 NOTE — Progress Notes (Signed)
SLP Cancellation Note  Patient Details Name: Christina [name] MRN: [id number] DOB: 1980-08-16   Cancelled treatment:        Out of room presently. Will continue attempts for SLE.   Royce Macadamia 05/28/2020, 10:13 AM   Breck Coons Lonell Face.Ed Nurse, children's [phone] Office [phone]

## 2020-05-29 ENCOUNTER — Inpatient Hospital Stay (HOSPITAL_COMMUNITY): Payer: BC Managed Care – PPO | Admitting: Certified Registered Nurse Anesthetist

## 2020-05-29 ENCOUNTER — Encounter (HOSPITAL_COMMUNITY): Payer: Self-pay | Admitting: Neurology

## 2020-05-29 ENCOUNTER — Encounter (HOSPITAL_COMMUNITY)
Admission: EM | Disposition: A | Discharge status: Home / Self Care | Payer: Self-pay | Source: Home / Self Care | Attending: Neurology

## 2020-05-29 ENCOUNTER — Inpatient Hospital Stay (HOSPITAL_COMMUNITY): Payer: BC Managed Care – PPO

## 2020-05-29 DIAGNOSIS — I10 Essential (primary) hypertension: Secondary | ICD-10-CM | POA: Diagnosis not present

## 2020-05-29 DIAGNOSIS — I639 Cerebral infarction, unspecified: Secondary | ICD-10-CM | POA: Diagnosis not present

## 2020-05-29 HISTORY — PX: TEE WITHOUT CARDIOVERSION: SHX5443

## 2020-05-29 HISTORY — PX: BUBBLE STUDY: SHX6837

## 2020-05-29 LAB — ENA+DNA/DS+ANTICH+CENTRO+JO...
Anti JO-1: <0.2 AI (ref 0.0–0.9)
Centromere Ab Screen: <0.2 AI (ref 0.0–0.9)
Chromatin Ab SerPl-aCnc: <0.2 AI (ref 0.0–0.9)
ENA SM Ab Ser-aCnc: <0.2 AI (ref 0.0–0.9)
Ribonucleic Protein: 1 AI — ABNORMAL HIGH (ref 0.0–0.9)
SSA (Ro) (ENA) Antibody, IgG: <0.2 AI (ref 0.0–0.9)
SSB (La) (ENA) Antibody, IgG: <0.2 AI (ref 0.0–0.9)
Scleroderma (Scl-70) (ENA) Antibody, IgG: <0.2 AI (ref 0.0–0.9)
ds DNA Ab: <1 IU/mL (ref 0–9)

## 2020-05-29 LAB — CARDIOLIPIN ANTIBODIES, IGG, IGM, IGA
Anticardiolipin IgA: <9 APL U/mL (ref 0–11)
Anticardiolipin IgG: <9 GPL U/mL (ref 0–14)
Anticardiolipin IgM: <9 MPL U/mL (ref 0–12)

## 2020-05-29 LAB — GLUCOSE, CAPILLARY
Glucose-Capillary: 293 mg/dL — ABNORMAL HIGH (ref 70–99)
Glucose-Capillary: 189 mg/dL — ABNORMAL HIGH (ref 70–99)
Glucose-Capillary: 172 mg/dL — ABNORMAL HIGH (ref 70–99)
Glucose-Capillary: 166 mg/dL — ABNORMAL HIGH (ref 70–99)
Glucose-Capillary: 336 mg/dL — ABNORMAL HIGH (ref 70–99)
Glucose-Capillary: 185 mg/dL — ABNORMAL HIGH (ref 70–99)

## 2020-05-29 LAB — LUPUS ANTICOAGULANT PANEL
DRVVT: 32.8 s (ref 0.0–47.0)
PTT Lupus Anticoagulant: 27.5 s (ref 0.0–51.9)

## 2020-05-29 LAB — BASIC METABOLIC PANEL
Anion gap: 6 (ref 5–15)
BUN: 10 mg/dL (ref 6–20)
CO2: 25 mmol/L (ref 22–32)
Calcium: 8.2 mg/dL — ABNORMAL LOW (ref 8.9–10.3)
Chloride: 103 mmol/L (ref 98–111)
Creatinine, Ser: 0.83 mg/dL (ref 0.44–1.00)
GFR, Estimated: >60 mL/min (ref >60)
Glucose, Bld: 303 mg/dL — ABNORMAL HIGH (ref 70–99)
Potassium: 3.4 mmol/L — ABNORMAL LOW (ref 3.5–5.1)
Sodium: 134 mmol/L — ABNORMAL LOW (ref 135–145)

## 2020-05-29 LAB — ANA W/REFLEX IF POSITIVE: Anti Nuclear Antibody (ANA): POSITIVE — AB

## 2020-05-29 SURGERY — ECHOCARDIOGRAM, TRANSESOPHAGEAL
Anesthesia: Monitor Anesthesia Care

## 2020-05-29 MED ORDER — INSULIN ASPART 100 UNIT/ML ~~LOC~~ SOLN
SUBCUTANEOUS | Status: DC | PRN
  Administered 2020-05-29: 4 [IU] via SUBCUTANEOUS

## 2020-05-29 MED ORDER — PROPOFOL 10 MG/ML IV BOLUS
INTRAVENOUS | Status: DC | PRN
  Administered 2020-05-29 (×2): 20 mg via INTRAVENOUS

## 2020-05-29 MED ORDER — LIVING WELL WITH DIABETES BOOK
Freq: Once | Status: AC
  Filled 2020-05-29: qty 1

## 2020-05-29 MED ORDER — PROPOFOL 500 MG/50ML IV EMUL
INTRAVENOUS | Status: DC | PRN
  Administered 2020-05-29 (×2): 150 ug/kg/min via INTRAVENOUS

## 2020-05-29 NOTE — Progress Notes (Signed)
  Echocardiogram Echocardiogram Transesophageal has been performed.  Gerda Diss 05/29/2020, 12:01 PM

## 2020-05-29 NOTE — TOC Initial Note (Signed)
Transition of Care [facility]) - Initial/Assessment Note    Patient Details  Name: Christina [name] MRN: [id number] Date of Birth: [birth year]  Transition of Care [facility]) CM/SW Contact:    Pollie Friar, RN Phone Number: 05/29/2020, 2:21 PM  Clinical Narrative:                 Recommendations are for outpatient OT and ST. Cm met with the patient and she was interested in attending [facility] in [place] that provides neurorehab. Orders faxed: [phone]. Pt denies issues with transportation or home medications.  TOC following for further d/c needs.   Expected Discharge Plan: OP Rehab Barriers to Discharge: Continued Medical Work up   Patient Goals and CMS Choice     Choice offered to / list presented to : Patient  Expected Discharge Plan and Services Expected Discharge Plan: OP Rehab   Discharge Planning Services: CM Consult   Living arrangements for the past 2 months: Single Family Home                                      Prior Living Arrangements/Services Living arrangements for the past 2 months: Single Family Home Lives with:: Siblings,Spouse,Minor Children Patient language and need for interpreter reviewed:: Yes Do you feel safe going back to the place where you live?: Yes            Criminal Activity/Legal Involvement Pertinent to Current Situation/Hospitalization: No - Comment as needed  Activities of Daily Living Home Assistive Devices/Equipment: None ADL Screening (condition at time of admission) Patient's cognitive ability adequate to safely complete daily activities?: Yes Is the patient deaf or have difficulty hearing?: No Does the patient have difficulty seeing, even when wearing glasses/contacts?: No Does the patient have difficulty concentrating, remembering, or making decisions?: No Patient able to express need for assistance with ADLs?: Yes Does the patient have difficulty dressing or bathing?: No Independently performs ADLs?: Yes (appropriate  for developmental age) Does the patient have difficulty walking or climbing stairs?: No Weakness of Legs: None Weakness of Arms/Hands: None  Permission Sought/Granted                  Emotional Assessment Appearance:: Appears stated age Attitude/Demeanor/Rapport: Engaged Affect (typically observed): Accepting Orientation: : Oriented to Self,Oriented to Place,Oriented to  Time,Oriented to Situation   Psych Involvement: No (comment)  Admission diagnosis:  Acute ischemic right MCA stroke ([place]) [I63.511] Acute ischemic stroke [facility]) [I63.9] Middle cerebral artery embolism, right [I66.01] Stroke [facility]) [I63.9] Patient Active Problem List   Diagnosis Date Noted  . Stroke ([place]) 05/28/2020  . Acute ischemic right MCA stroke ([place]) 05/27/2020  . Middle cerebral artery embolism, right 05/27/2020   PCP:  Brock Ra, PA-C Pharmacy:   [id number] [place], [place] - [address]Phone: [phone]: [phone]    Social Determinants of Health (SDOH) Interventions    Readmission Risk Interventions No flowsheet data found.

## 2020-05-29 NOTE — Progress Notes (Signed)
Inpatient Diabetes Program Recommendations  AACE/ADA: New Consensus Statement on Inpatient Glycemic Control (2015)  Target Ranges:  Prepandial:   less than 140 mg/dL      Peak postprandial:   less than 180 mg/dL (1-2 hours)      Critically ill patients:  140 - 180 mg/dL   Lab Results  Component Value Date   GLUCAP 166 (H) 05/29/2020   HGBA1C 9.7 (H) 05/28/2020    Review of Glycemic Control Results for SABRIE, MORITZ (MRN [id number]) as of 05/29/2020 14:06  Ref. Range 05/28/2020 15:32 05/28/2020 19:38 05/28/2020 21:08 05/29/2020 06:45 05/29/2020 12:52  Glucose-Capillary Latest Ref Range: 70 - 99 mg/dL 030 (H) 092 (H) 330 (H) 293 (H) 166 (H)   Diabetes history: DM 2 Outpatient Diabetes medications:  Lantus 34 units daily Metformin XR 1000 mg with breakfast Ozempic 0.75 mls/week  Current orders for Inpatient glycemic control:  Novolog resistant tid with meals  Novolog 4 units tid with meals Lantus 25 units daily  Inpatient Diabetes Program Recommendations:    Spoke with patient at bedside regarding home DM management. She states that she has not taken Lantus in about a year b/c blood sugars had improved with Ozempic.  However in the past 6 weeks, she has had a difficult time getting Ozempic b/c it has been back ordered.  A1C in February was 7.3%.  We discussed current A1C and goal blood sugars. Told patient that she will likely need to restart Lantus at home and she agreed.  However, blood sugars may improve since she has Ozempic available now (states she restarted last week).  Recommend close f/u with PCP.  She has used [employer] in the past and plans to ask for refill from PCP stating that this really helps her.   Will also order LWWD booklet for patient.   Thanks Beryl Meager, RN, BC-ADM Inpatient Diabetes Coordinator Pager [phone] (8a-5p)

## 2020-05-29 NOTE — Anesthesia Postprocedure Evaluation (Signed)
Anesthesia Post Note  Patient: Christina [name]  Procedure(s) Performed: TRANSESOPHAGEAL ECHOCARDIOGRAM (TEE) (N/A ) BUBBLE STUDY     Patient location during evaluation: Endoscopy Anesthesia Type: MAC Level of consciousness: awake and alert Pain management: pain level controlled Vital Signs Assessment: post-procedure vital signs reviewed and stable Respiratory status: spontaneous breathing, nonlabored ventilation, respiratory function stable and patient connected to nasal cannula oxygen Cardiovascular status: stable and blood pressure returned to baseline Postop Assessment: no apparent nausea or vomiting Anesthetic complications: no   No complications documented.  Last Vitals:  Vitals:   05/29/20 1249 05/29/20 1459  BP: 126/89 122/80  Pulse: 70 70  Resp: 20 20  Temp: 36.8 C 36.8 C  SpO2: 100% 100%    Last Pain:  Vitals:   05/29/20 1459  TempSrc: Oral  PainSc:                  [name]

## 2020-05-29 NOTE — CV Procedure (Addendum)
     TRANSESOPHAGEAL ECHOCARDIOGRAM   NAME:  Christina [name]   MRN: [id number] DOB:  October 21, 1980   ADMIT DATE: 05/27/2020  INDICATIONS: CVA  PROCEDURE:   Informed consent was obtained prior to the procedure. The risks, benefits and alternatives for the procedure were discussed and the patient comprehended these risks.  Risks include, but are not limited to, cough, sore throat, vomiting, nausea, somnolence, esophageal and stomach trauma or perforation, bleeding, low blood pressure, aspiration, pneumonia, infection, trauma to the teeth and death.    After a procedural time-out, the oropharynx was anesthetized and the patient was sedated by the anesthesia service. The transesophageal probe was inserted in the esophagus and stomach without difficulty and multiple views were obtained. Anesthesia was monitored by Dr Noreene Larsson and Loma Sousa, CRNA   COMPLICATIONS:    There were no immediate complications.  FINDINGS:  No thrombus, mass or vegetation seen.  Positive bubble study suggestive of small PFO though shunting occurred late and could represent intrapulmonary shunt   Epifanio Lesches MD [facility]  [address] [place], [place] [address] [phone]   11:50 AM

## 2020-05-29 NOTE — Plan of Care (Signed)

## 2020-05-29 NOTE — Progress Notes (Signed)
Pt NPO at 0000 05/29/2020.

## 2020-05-29 NOTE — Plan of Care (Signed)

## 2020-05-29 NOTE — Interval H&P Note (Signed)
History and Physical Interval Note:  05/29/2020 11:00 AM  Christina [name]  has presented today for surgery, with the diagnosis of STROKE.  The various methods of treatment have been discussed with the patient and family. After consideration of risks, benefits and other options for treatment, the patient has consented to  Procedure(s): TRANSESOPHAGEAL ECHOCARDIOGRAM (TEE) (N/A) as a surgical intervention.  The patient's history has been reviewed, patient examined, no change in status, stable for surgery.  I have reviewed the patient's chart and labs.  Questions were answered to the patient's satisfaction.     Little Ishikawa

## 2020-05-29 NOTE — Progress Notes (Signed)
STROKE TEAM PROGRESS NOTE  Brief HPI:  Christina [name] is a 40 y.o. female who presented to the ED with left upper extremity weakness, facial droop, very mild neglect of the left side, very subtle gaze preference, and beginning to have left-sided numbness as well. Code IR activated for a right M1 stenosis. tPa was given. She was taken for emergent mechanical thrombectomy of the right M1 withTICi 3 revascularization  INTERVAL HISTORY No acute events overnight.  TEE to be done today. She is up in wheelchair on her way to TEE on rounds. Denies new concerns. We discussed plan of care pending TEE results. Possible discharge tomorrow. Questions answered.  Vital signs are stable.  Neurological exam is unchanged. Vitals:   05/28/20 2113 05/28/20 2359 05/29/20 0100 05/29/20 0401  BP: 138/79 120/70 110/78 114/70  Pulse: 79 85 72 71  Resp:  18 18 19   Temp:  98.4 F (36.9 C) 98 F (36.7 C) 98.4 F (36.9 C)  TempSrc:  Oral Oral Oral  SpO2: 100% 100% 99% 100%  Weight:       CBC:  Recent Labs  Lab 05/27/20 1433 05/28/20 0505  WBC 11.4* 13.5*  NEUTROABS 8.2* 10.0*  HGB 14.5 12.9  HCT 42.7 38.4  MCV 92.0 92.5  PLT 359 314   Basic Metabolic Panel:  Recent Labs  Lab 05/27/20 2253 05/28/20 0505  NA 133* 132*  K 3.8 3.5  CL 100 100  CO2 19* 24  GLUCOSE 300* 275*  BUN 8 7  CREATININE 0.93 0.72  CALCIUM 8.8* 8.5*   Lipid Panel:  Recent Labs  Lab 05/28/20 0505  CHOL 75  TRIG 170*  HDL 26*  CHOLHDL 2.9  VLDL 34  LDLCALC 15   HgbA1c:  Recent Labs  Lab 05/28/20 0505  HGBA1C 9.7*   Urine Drug Screen: RN to send  Alcohol Level  Recent Labs  Lab 05/27/20 1433  ETH <10   IMAGING and pertinent diagnostics   CT Head Code Stroke  1. No acute finding. 2. 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  CTA Head  1. Right M1 occlusion. No detected embolic source in the  neck, although suboptimal CTA quality due to motion and soft tissue attenuation. 2. Narrowing of the left vertebral artery at the level of C2 and V4, consider injection at catheter angiogram to exclude FMD or Dissection.  MRI Acute infarcts within the right MCA and MCA/ACA and MCA/PCA watershed territories, as described and predominantly affecting the anteromedial right temporal lobe, right insula and small portions of the right frontoparietal operculum. No hemorrhagic conversion. No significant mass effect.  As before, the cerebellar tonsils extend 5 mm below the level of theforamen magnum. Associated mild crowding at the level of the foramenmagnum. 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 Alternatively, cerebellar tonsillar ectopia may be seen in the setting of idiopathic intracranial hypertension (pseudotumor cerebri) or intracranial hypotension. Notably, there is a partially empty sella turcica, a finding which may also be seen in setting of intracranial hypertension. Paranasal sinus disease   2D echo FINDINGS  Left Ventricle: Left ventricular ejection fraction, by estimation, is 55  to 60%. The left ventricle has normal function. The left ventricle has no  regional wall motion abnormalities. The left ventricular internal cavity  size was normal in size. There is  no left ventricular hypertrophy. Left ventricular diastolic parameters  are consistent with Grade II diastolic dysfunction (pseudonormalization).  Indeterminate filling pressures.   Right Ventricle: The right ventricular size is normal. No increase in  right ventricular wall thickness. Right ventricular systolic function is  normal.   Left Atrium: Left atrial size was normal in size.  Right Atrium: Right atrial size was normal in size.  Pericardium: There is no evidence of pericardial effusion.  Mitral v alve: The mitral valve is normal in structure. No evidence of mitral valve regurgitation. No evidence  of mitral valve stenosis.   Tricuspid Valve: The tricuspid valve is normal in structure. Tricuspid valve regurgitation is trivial. No evidence of tricuspid stenosis.   Aortic Valve: The aortic valve is tricuspid. Aortic valve regurgitation is not visualized. No aortic stenosis is present. Aortic valve mean gradient measures 3.0 mmHg. Aortic valve peak gradient measures 4.9 mmHg. Aortic  valve area, by VTI measures 2.60  cm.   PHYSICAL EXAM Physical Exam  Constitutional: Obese young African-American lady not in distress.  Psych: Affect appropriate to situation, calm and cooperative  Eyes: No scleral injection HENT: No oropharyngeal obstruction.  MSK: no joint deformities.  Cardiovascular: Normal rate and regular rhythm.  Respiratory: Effort normal, non-labored breathing GI: Soft.  No distension. There is no tenderness.  Skin: Warm dry and intact visible skin Neuro: Mental Status: Patient is awake, alert, oriented to person, place, month, year, and situation. Patient is able to give a clear and coherent history. No signs of aphasia or neglect initially, but then on later evaluation had some neglect to double sided stimulation on the left side as well as developing a right gaze preference Cranial Nerves: II: Visual Fields are full   Pupils are equal, round, and reactive to light.   III,IV, VI: EOMI without ptosis or diploplia.  V: Facial sensation is reduced on the right. VII: Facial movement is reduced on the left. VIII: hearing is intact to voice  X: Uvula elevates symmetrically XI: Shoulder shrug is symmetric. XII: tongue is midline without atrophy or fasciculations.  Motor: Tone is normal. Bulk is normal.   Subtle left upper extremity drift.  Weakness of left grip and intrinsic hand muscles.  Orbits right over left upper extremity. No drift of any of the other 3 extremities.  On repeat testing left grip strength is also weaker than the right Sensory: Initially was reporting a  change in sensation on the right face arm and leg, but then later reported increasing left arm numbness Cerebellar: FNF and HKS are intact bilaterally  ASSESSMENT/PLAN Christina [name] is a 40 y.o. female with a past medical history significant for diabetes, hypertension, hyperlipidemia, obesity, asthma, smoking presenting with acute onset left-sided weakness and right-sided sensory changes with MRI showing stroke predominantly affecting the anteromedial right temporal lobe, right insula and small portions of the right frontoparietal operculum.   Cryptogenic acute infarcts within the right MCA and MCA/ACA and MCA/PCA watershed territories with M1 stenosis s/p tPA and thrombectomy with complete revascularization of occluded RT MCA M 1 achieving a TICI 3 revascularization.   CTA: right M1 stenosis.  2D Echo EF 55-60%, Grade II diastolic dysfunction  Bilat LE dopplers are negative  Hypercoag labs are PENDING  TEE: prelim result: no clot, +small PFO,  VTE prophylaxis - Lovenox     Diet   Diet NPO time specified    Therapy recommendations:  TBD  Disposition:  TBD  Hypertension  Stable off cleviprex  . Permissive hypertension (OK if < 220/120) but gradually normalize in 5-7 days . Long-term BP goal normotensive  Hyperlipidemia  Home meds:  Crestor 20mg   LDL 15, at goal < 70  High intensity statin continued   Continue statin at discharge  Diabetes type II: Uncontrolled  Diabetes nurse consulted   HgbA1c 9.7, goal < 7.0  CBGs Recent Labs    05/28/20 1938 05/28/20 2108 05/29/20 0645  GLUCAP 234* 260* 293*      SSI TID and HS  Lantus 25 units daily and 4 units novolog with meals added   Hyponatremia  Na 132-129-133-132  Monitor   Asymptomatic leukocytosis  7->11->13  Afebrile  Likely reactive  Monitor   Other Stroke Risk Factors    Cigarette smoker and she was advised to stop smoking  ETOH use, alcohol level <10, advised to drink no more  than 1drink(s) a day  Obesity, Body mass index is 43.56 kg/m., BMI >/= 30 associated with increased stroke risk, recommend weight loss, diet and exercise as appropriate   Possible Obstructive sleep apnea: may enroll in study  Echo is pending   Hospital day # 2 Patient had a TEE done today which shows no left atrial clot or significant valvular abnormality but shows a small PFO.  Lower extremity venous Dopplers negative for DVT.  Plan to check transcranial Doppler bubble study to characterize the PFO and she may need elective endovascular PFO closure at a later stage.  Continue therapies and mobilize out of bed.  May consider possible participation in the sleep smart study or outpatient evaluation for sleep apnea.  Greater than 50% time during this 25-minute visit was spent in counseling and coordination of care about her stroke and discussion with care team.  05/31/20 MD    To contact Stroke Continuity provider, please refer to Delia Heady. After hours, contact General Neurology

## 2020-05-29 NOTE — Progress Notes (Signed)
TCD bubble study has been completed.   Preliminary results in CV Proc.   Blanch Media 05/29/2020 3:02 PM

## 2020-05-29 NOTE — Anesthesia Preprocedure Evaluation (Signed)
Anesthesia Evaluation  Patient identified by MRN, date of birth, ID band Patient awake    Reviewed: Allergy & Precautions, NPO status , Patient's Chart, lab work & pertinent test results  Airway Mallampati: II  TM Distance: >3 FB Neck ROM: Full    Dental  (+) Teeth Intact, Dental Advisory Given   Pulmonary Current Smoker,    breath sounds clear to auscultation       Cardiovascular hypertension,  Rhythm:Regular Rate:Normal     Neuro/Psych    GI/Hepatic   Endo/Other  diabetes  Renal/GU      Musculoskeletal   Abdominal   Peds  Hematology   Anesthesia Other Findings   Reproductive/Obstetrics                             Anesthesia Physical Anesthesia Plan  ASA: III  Anesthesia Plan: MAC   Post-op Pain Management:    Induction: Intravenous  PONV Risk Score and Plan: Ondansetron and Dexamethasone  Airway Management Planned: Natural Airway and Nasal Cannula  Additional Equipment:   Intra-op Plan:   Post-operative Plan:   Informed Consent: I have reviewed the patients History and Physical, chart, labs and discussed the procedure including the risks, benefits and alternatives for the proposed anesthesia with the patient or authorized representative who has indicated his/her understanding and acceptance.     Dental advisory given  Plan Discussed with: CRNA and Anesthesiologist  Anesthesia Plan Comments:         Anesthesia Quick Evaluation

## 2020-05-29 NOTE — Transfer of Care (Signed)
Immediate Anesthesia Transfer of Care Note  Patient: Christina [name]  Procedure(s) Performed: TRANSESOPHAGEAL ECHOCARDIOGRAM (TEE) (N/A ) BUBBLE STUDY  Patient Location: PACU and Endoscopy Unit  Anesthesia Type:MAC  Level of Consciousness: patient cooperative and responds to stimulation  Airway & Oxygen Therapy: Patient Spontanous Breathing  Post-op Assessment: Report given to RN and Post -op Vital signs reviewed and stable  Post vital signs: Reviewed and stable  Last Vitals:  Vitals Value Taken Time  BP    Temp    Pulse    Resp    SpO2      Last Pain:  Vitals:   05/29/20 1040  TempSrc: Oral  PainSc: 0-No pain      Patients Stated Pain Goal: 0 (11/65/79 0383)  Complications: No complications documented.

## 2020-05-30 DIAGNOSIS — I639 Cerebral infarction, unspecified: Secondary | ICD-10-CM

## 2020-05-30 LAB — BASIC METABOLIC PANEL
Anion gap: 6 (ref 5–15)
BUN: 10 mg/dL (ref 6–20)
CO2: 24 mmol/L (ref 22–32)
Calcium: 8.3 mg/dL — ABNORMAL LOW (ref 8.9–10.3)
Chloride: 106 mmol/L (ref 98–111)
Creatinine, Ser: 0.77 mg/dL (ref 0.44–1.00)
GFR, Estimated: >60 mL/min (ref >60)
Glucose, Bld: 217 mg/dL — ABNORMAL HIGH (ref 70–99)
Potassium: 3.6 mmol/L (ref 3.5–5.1)
Sodium: 136 mmol/L (ref 135–145)

## 2020-05-30 LAB — GLUCOSE, CAPILLARY
Glucose-Capillary: 200 mg/dL — ABNORMAL HIGH (ref 70–99)
Glucose-Capillary: 256 mg/dL — ABNORMAL HIGH (ref 70–99)

## 2020-05-30 MED ORDER — ROSUVASTATIN CALCIUM 40 MG PO TABS: 40.0000 mg | ORAL_TABLET | Freq: Every day | ORAL | Status: DC

## 2020-05-30 MED ORDER — ASPIRIN 81 MG PO TBEC: 81.0000 mg | DELAYED_RELEASE_TABLET | Freq: Every day | ORAL | 11 refills | Status: DC

## 2020-05-30 NOTE — Progress Notes (Signed)
  Speech Language Pathology Treatment: Cognitive-Linquistic  Patient Details Name: Christina [name] MRN: [id number] DOB: January 20, 1981 Today's Date: 05/30/2020 Time: 9574-7340 SLP Time Calculation (min) (ACUTE ONLY): 37 min  Assessment / Plan / Recommendation Clinical Impression  Pt seen for ongoing cognitive treatment.  Pt awake, alert, very pleasant, and participative.  Pt completed calculations for time, money, and medication management with 80% accuracy.  Pt had an error on first item only, which resolved with verbal cuing.  Pt completed planning and sequencing activity with 100% accuracy.  Pt was also asked to complete divided attention task in conjunction with other therapy activities.  With divided attention task pt identified 0/15 items correctly.  Pt did make note of 1 item as therapist was leaving at end of session.  Pt independently demonstrated use of external aid (phone note) for recalling important information related to pending discharge.  Pt is nervous about the possibility of another stroke event, knowing she is now at risk.    Pt's speech was clear and seemingly without dysarthria.  Pt reports she feels that her speech has returned to baseline.  Recommend continuing ST to address higher level cognitive function as outpatient.   HPI HPI: 40 year old female on Pediatric unit at [facility] visiting her son who is admitted, sneezed and had sudden right facial droop, dysarthria. MRI acute infarcts within right MCA/ACA and PCA. Found to have occluded RT MCA M1 and underwent thrombectomy 4/24.      SLP Plan  Continue with current plan of care       Recommendations                   Follow up Recommendations: Outpatient SLP SLP Visit Diagnosis: Cognitive communication deficit (Z70.964) Plan: Continue with current plan of care       GO                Kerrie Pleasure, MA, CCC-SLP Acute Rehabilitation Services Office: [phone] 05/30/2020, 12:11 PM

## 2020-05-30 NOTE — Discharge Instructions (Signed)
It is extremely important that you follow up closely with your primary care provider and work together to  control your diabetes. Please make appointment ASAP within 1-2 weeks.

## 2020-05-30 NOTE — Progress Notes (Signed)
Occupational Therapy Treatment Patient Details Name: Christina [name] MRN: [id number] DOB: 03/19/1980 Today's Date: 05/30/2020    History of present illness Pt is a 40 y.o. F who presents with acute onset left sided weakness and right sided sensory changes. CT negative for acute intracranial process. CTA showed right M1 stenosis. Pt receieved tPA and revascularization of occluded R MCA M1.   Significant PMH: diabetes, HTN, HLD, obesity, asthma, smoking.   OT comments  Pt making steady progress towards OT goals this session. Session focus on administration of pillbox assessment. See general comments for further detail of assessment results. Pt did fail assessment as pt unable to complete the task within the allotted time frame, however pt demonstrated good problem solving skills and organizational skills noted to set aside each pill container after placing pills in box and double checking all placement. 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 Feel pt will be able to manage meds at home based on the organizational, problem solving and executive functioning skills observed during session  even though pt received a failing score. Pt likely to DC home later today and reports she has all necessary DME as well as family support. Pt declined further functional mobility or BADL participation, will follow acutely per POC.     Follow Up Recommendations  Other (comment);Outpatient OT (neuro outpt OT)    Equipment Recommendations  None recommended by OT    Recommendations for Other Services      Precautions / Restrictions Precautions Precautions: None Restrictions Weight Bearing Restrictions: No       Mobility Bed Mobility               General bed mobility comments: sitting EOB and left EOB at end of session    Transfers                      Balance Overall balance assessment: No apparent balance deficits (not formally assessed)                                          ADL either performed or assessed with clinical judgement   ADL                                         General ADL Comments: session focus on admininstering pillbos assessment     Vision       Perception     Praxis      Cognition Arousal/Alertness: Awake/alert Behavior During Therapy: WFL for tasks assessed/performed Overall Cognitive Status: Impaired/Different from baseline Area of Impairment: Attention;Safety/judgement;Awareness                   Current Attention Level: Selective     Safety/Judgement: Decreased awareness of deficits Awareness: Emergent            Exercises     Shoulder Instructions       General Comments General Comments: Assessed using the Pill Box Test. Pt had a total of  0 errors, but pt was unable to complete the assessment within the allotted time period, where more than 3 errors is considered a fail.  Errors: One tablet 3x/day (yellow) - _0_ errors (omission/misplacement) One tablet 2x/day with breakfast and dinner (green) -  __0 errors (omission/misplacement) One tablet in the morning (Blue)- _0_ errors (omission/misplacement) One tablet daily at bedtime (orange) - _0_ errors(omission/misplacement) One tablet every other day (red) - _0_ errors (omission/misplacement)   Number of misplaced movement errors (pills placed in incorrect compartment)- __0 Number of total errors (sum of omissions; misplacements) - _0_ Total time to complete task (allowed 5 min) - _8_ min    Pertinent Vitals/ Pain       Pain Assessment: No/denies pain Faces Pain Scale: No hurt  Home Living                                          Prior Functioning/Environment              Frequency  Min 2X/week        Progress Toward Goals  OT Goals(current goals can now be found in the care plan section)  Progress towards OT goals: Progressing toward goals  Acute Rehab OT  Goals Patient Stated Goal: return home OT Goal Formulation: With patient Time For Goal Achievement: 06/11/20 Potential to Achieve Goals: Good  Plan Discharge plan remains appropriate;Frequency remains appropriate    Co-evaluation                 AM-PAC OT "6 Clicks" Daily Activity     Outcome Measure   Help from another person eating meals?: None Help from another person taking care of personal grooming?: None Help from another person toileting, which includes using toliet, bedpan, or urinal?: A Little Help from another person bathing (including washing, rinsing, drying)?: A Little Help from another person to put on and taking off regular upper body clothing?: None Help from another person to put on and taking off regular lower body clothing?: A Little 6 Click Score: 21    End of Session    OT Visit Diagnosis: Other symptoms and signs involving cognitive function;Muscle weakness (generalized) (M62.81)   Activity Tolerance Patient tolerated treatment well   Patient Left in bed;with call bell/phone within reach;Other (comment) (sitting EOB)   Nurse Communication          Time: 3267-1245 OT Time Calculation (min): 23 min  Charges: OT General Charges $OT Visit: 1 Visit OT Treatments $Self Care/Home Management : 23-37 mins  Lenor Derrick., COTA/L Acute Rehabilitation Services [phone]    Barron Schmid 05/30/2020, 2:16 PM

## 2020-05-30 NOTE — Discharge Summary (Addendum)
Stroke Discharge Summary  Patient ID: Christina [name]    l   MRN: [id number]      DOB: 04/05/1980  Date of Admission: 05/27/2020 Date of Discharge: 05/30/2020  Attending Physician:  Christina [name], Christina Quintela S, MD, Stroke MD Consultant([name]):    IR. Dr. Corliss [name]  Patient'[name] PCP:  Christina [name], Maida, PA-C  DISCHARGE DIAGNOSIS:  1. 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 2. Uncontrolled DM2 3. HLD 4. Possible sleep apnea 5. Obesity 6. HTN  Active Problems:   Acute ischemic stroke [name](HCC)   Middle cerebral artery embolism, right   [name](HCC)  Active Ambulatory Problems    Diagnosis Date Noted  . No Active Ambulatory Problems   Resolved Ambulatory Problems    Diagnosis Date Noted  . No Resolved Ambulatory Problems   Past Medical History:  Diagnosis Date  . Asthma   . Diabetes mellitus without complication (HCC)   . Hypertension    Allergies as of 05/30/2020      Reactions   Peanut-containing Drug Products Anaphylaxis   Lisinopril Other (See Comments)   Lip swelling   Penicillins Rash   Tomato Rash   Fresh tomatoes       Medication List    STOP taking these medications   ibuprofen 600 MG tablet Commonly known as: ADVIL   methocarbamol 500 MG tablet Commonly known as: ROBAXIN     TAKE these medications   albuterol 108 (90 Base) MCG/ACT inhaler Commonly known as: VENTOLIN HFA Inhale 2 puffs into the lungs every 6 (six) hours as needed for wheezing.   aspirin 81 MG EC tablet Take 1 tablet (81 mg total) by mouth daily. Swallow whole.   Breo Ellipta 100-25 MCG/INH Aepb Generic drug: fluticasone furoate-vilanterol Inhale 1 puff into the lungs daily.   ergocalciferol 1.25 MG (50000 UT) capsule Commonly known as: VITAMIN D2 Take 50,000 Units by mouth 2 (two) times a week. Monday and Thursday    fexofenadine 180 MG tablet Commonly known as: ALLEGRA Take 180 mg by mouth daily.   hydrochlorothiazide 25 MG tablet Commonly known as: HYDRODIURIL Take 25 mg by mouth daily.   insulin glargine 100 UNIT/ML injection Commonly known as: LANTUS Inject 34 Units into the skin daily as needed (high sugar).   metFORMIN 500 MG 24 hr tablet Commonly known as: GLUCOPHAGE-XR Take 1,000 mg by mouth daily with breakfast.   metoprolol tartrate 100 MG tablet Commonly known as: LOPRESSOR Take 100 mg by mouth daily.   ondansetron 4 MG disintegrating tablet Commonly known as: Zofran ODT Take 1 tablet (4 mg total) by mouth every 8 (eight) hours as needed for nausea or vomiting.   Ozempic (1 MG/DOSE) 4 MG/3ML Sopn Generic drug: Semaglutide (1 MG/DOSE) Inject 0.75 mLs into the skin every Monday.   rosuvastatin 40 MG tablet Commonly known as: CRESTOR Take 1 tablet (40 mg total) by mouth daily. What changed: Another medication with the same name was removed. Continue taking this medication, and follow the directions you see here.      LABORATORY STUDIES CBC    Component Value Date/Time   WBC 13.5 (H) 05/28/2020 0505   RBC 4.15 05/28/2020 0505   HGB 12.9 05/28/2020 0505   HCT 38.4 05/28/2020 0505   PLT 314 05/28/2020 0505   MCV 92.5 05/28/2020 0505   MCH 31.1 05/28/2020 0505   MCHC 33.6  05/28/2020 0505   RDW 11.8 05/28/2020 0505   LYMPHSABS 2.3 05/28/2020 0505   MONOABS 1.1 (H) 05/28/2020 0505   EOSABS 0.0 05/28/2020 0505   BASOSABS 0.0 05/28/2020 0505   CMP    Component Value Date/Time   NA 136 05/30/2020 0304   K 3.6 05/30/2020 0304   CL 106 05/30/2020 0304   CO2 24 05/30/2020 0304   GLUCOSE 217 (H) 05/30/2020 0304   BUN 10 05/30/2020 0304   CREATININE 0.77 05/30/2020 0304   CALCIUM 8.3 (L) 05/30/2020 0304   PROT 7.0 05/27/2020 1433   ALBUMIN 3.6 05/27/2020 1433   AST 20 05/27/2020 1433   ALT 24 05/27/2020 1433   ALKPHOS 55 05/27/2020 1433   BILITOT 0.6 05/27/2020  1433   GFRNONAA >60 05/30/2020 0304   GFRAA >60 09/16/2019 1625   COAGS Lab Results  Component Value Date   INR 1.1 05/27/2020   Lipid Panel    Component Value Date/Time   CHOL 75 05/28/2020 0505   TRIG 170 (H) 05/28/2020 0505   HDL 26 (L) 05/28/2020 0505   CHOLHDL 2.9 05/28/2020 0505   VLDL 34 05/28/2020 0505   LDLCALC 15 05/28/2020 0505   HgbA1C  Lab Results  Component Value Date   HGBA1C 9.7 (H) 05/28/2020   Urinalysis    Component Value Date/Time   COLORURINE YELLOW 09/16/2019 1258   APPEARANCEUR HAZY (A) 09/16/2019 1258   LABSPEC 1.025 09/16/2019 1258   PHURINE 6.0 09/16/2019 1258   GLUCOSEU NEGATIVE 09/16/2019 1258   HGBUR SMALL (A) 09/16/2019 1258   BILIRUBINUR NEGATIVE 09/16/2019 1258   KETONESUR NEGATIVE 09/16/2019 1258   PROTEINUR NEGATIVE 09/16/2019 1258   NITRITE NEGATIVE 09/16/2019 1258   LEUKOCYTESUR NEGATIVE 09/16/2019 1258   Urine Drug Screen     Component Value Date/Time   LABOPIA NONE DETECTED 05/28/2020 0836   COCAINSCRNUR NONE DETECTED 05/28/2020 0836   LABBENZ NONE DETECTED 05/28/2020 0836   AMPHETMU NONE DETECTED 05/28/2020 0836   THCU NONE DETECTED 05/28/2020 0836   LABBARB NONE DETECTED 05/28/2020 0836    Alcohol Level    Component Value Date/Time   ETH <10 05/27/2020 1433    SIGNIFICANT DIAGNOSTIC STUDIES  CT Head Code Stroke  1. No acute finding. 2. 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  CTA Head  1. Right M1 occlusion. No detected embolic source in the neck, although suboptimal CTA quality due to motion and soft tissue attenuation. 2. Narrowing of the left vertebral artery at the level of C2 and V4, consider injection at catheter angiogram to exclude FMD or Dissection.  MRI Acute infarcts within the right MCA and MCA/ACA and MCA/PCA watershed territories, as described and predominantly affecting the  anteromedial right temporal lobe, right insula and small portions of the right frontoparietal operculum. No hemorrhagic conversion. No significant mass effect.  As before, the cerebellar tonsils extend 5 mm below the level of theforamen magnum. Associated mild crowding at the level of the foramenmagnum. 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 Alternatively, cerebellar tonsillar ectopia may be seen in the setting of idiopathic intracranial hypertension (pseudotumor cerebri) or intracranial hypotension. Notably, there is a partially empty sella turcica, a finding which may also be seen in setting of intracranial hypertension. Paranasal sinus disease   Transcranial doppler with bubble No HITS at rest or during Valsalva. Negative transcranial Doppler Bubble study with no evidence of right to left intracardiac communication.  A vascular  evaluation was performed. The left middle cerebral artery was studied. An IV was inserted into the patient'[name] right forearm . Verbal informed consent was obtained.    Negative TCD Bubble study   2D echo Left Ventricle: Left ventricular ejection fraction, by estimation, is 55 to 60%. The left ventricle has normal function. The left ventricle has no regional wall motion abnormalities. The left ventricular internal cavity size was normal in size. There is no left ventricular hypertrophy. Left ventricular diastolic parameters  are consistent with Grade II diastolic dysfunction (pseudonormalization).  Indeterminate filling pressures.   Right Ventricle: The right ventricular size is normal. No increase in right ventricular wall thickness. Right ventricular systolic function is normal.   Left Atrium: Left atrial size was normal in size.   Right Atrium: Right atrial size was normal in size.   Pericardium: There is no evidence of pericardial effusion.   Mitral Valve: The mitral valve is normal in structure. No evidence of mitral valve regurgitation. No  evidence of mitral valve stenosis.   Tricuspid Valve: The tricuspid valve is normal in structure. Tricuspid valve regurgitation is trivial. No evidence of tricuspid stenosis.   Aortic Valve: The aortic valve is tricuspid. Aortic valve regurgitation is not visualized. No aortic stenosis is present. Aortic valve mean gradient  measures 3.0 mmHg. Aortic valve peak gradient measures 4.9 mmHg. Aortic  valve area, by VTI measures 2.60  cm.  Pulmonic Valve: The pulmonic valve was normal in structure. Pulmonic valve  regurgitation is not visualized. No evidence of pulmonic stenosis.   Aorta: The aortic root is normal in size and structure.   Venous: The inferior vena cava is normal in size with greater than 50%  respiratory variability, suggesting right atrial pressure of 3 mmHg.   IAS/Shunts: No atrial level shunt detected by color flow Doppler. Agitated saline contrast was given intravenously to evaluate for intracardiac shunting.      HISTORY OF PRESENT ILLNESS Christina [name] is a 40 y.o. female who presented to the ED with left upper extremity weakness, facial droop, very mild neglect of the left side, very subtle gaze preference, and beginning to have left-sided numbness as well. Code IR activated for a right M1 stenosis. tPa was given. She was taken for emergent mechanical thrombectomy of the right M1 withTICi 3 revascularization  HOSPITAL COURSE She was admitted to the neurologic ICU postprocedure. There she remained hemodynamically and neurologically stable. She made progress and was transferred to the floor. Therapy teams evaluated patient and cleared her for discharge home. 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   CTA: right M1  stenosis.  2D Echo EF 55-60%, Grade II diastolic dysfunction  Bilat LE dopplers are negative  Hypercoag labs:  ANA pending (Cardiolopin and Lupus anticoagulant neg)  TEE: No PFO per Dr.Gus Littler'[name] review  Transcranial doppler with bubble negative   VTE prophylaxis - Lovenox   Therapy recommendations:  Home  Disposition:  Home   Hypertension  Permissive hypertension (OK if < 220/120) but gradually normalize in 5-7 days  Long-term BP goal normotensive  Hyperlipidemia  Home meds:  Crestor 20mg   LDL 15, at goal < 70  High intensity statin continued   Continue statin at discharge  Diabetes type II: Uncontrolled  Diabetes nurse consulted   HgbA1c 9.7, goal <  7.0  CBGs Recent Labs (last 2 labs)         Recent Labs      05/28/20  1938  05/28/20  2108  05/29/20  0645   GLUCAP  234*  260*  293*    ?    Return to home lantus 32 units recommended in the short term with very close PCP follow up recommended.   Has had difficulty obtaining home medications (Ozempic) and seems that she has not been communicating with PCP regarding these difficulties-Self directing regimen.    Other Stroke Risk Factors  Cigarette smoker and she was advised to stop smoking  ETOH use, alcohol level <10, advised to drink no more than 1drink([name]) a day  Obesity, Body mass index is 43.56 kg/m., BMI >/= 30 associated with increased stroke risk, recommend weight loss, diet and exercise as appropriate   Possible Obstructive sleep apnea: may enroll in study  Echo is pending  Physical Exam Constitutional: Obese AA female sitting up in bed in NAD Psych: Affect appropriate to situation,calm and cooperative Eyes: No scleral injection HENT: No oropharyngeal obstruction.  MSK: no joint deformities.  Cardiovascular: Normal rate and regular rhythm.  Respiratory: Effort normal, non-labored breathing GI: Soft. No distension. There is no tenderness.  Skin: Warm dry and intact  visible skin Neuro: Mental Status: Patient is awake, alert, oriented to person, place, month, year, and situation. Patient is able to give a clear and coherent history. No signs of aphasia or neglectinitially, but then on later evaluation had some neglect to double sided stimulation on the left side as well as developing a right gaze preference Cranial Nerves: II: Visual Fields are full  Pupils are equal, round, and reactive to light.  III,IV, VI: EOMI without ptosis or diploplia.  V: Facial sensation is reduced on the right. VII: Facial movement isreduced on the left. VIII: hearing is intact to voice  X: Uvula elevates symmetrically XI: Shoulder shrug is symmetric. XII: tongue is midline without atrophy or fasciculations.  Motor: Tone is normal. Bulk is normal.  Subtle left upper extremity drift.  Weakness of left grip and intrinsic hand muscles.  Orbits right over left upper extremity.No drift of any of the other 3 extremities. On repeat testing left grip strength is also weaker than the right Sensory: Initially was reporting a change in sensation on the right face arm and leg, but then later reported increasing left arm numbness Cerebellar: FNF and HKS are intact bilaterally  DISCHARGE PLAN   Ongoing stroke risk factor control by Primary Care Physician at time of discharge  Follow-up PCP Christina Bang, PA-C in 2 weeks.  Follow-up in [facility] in 4 weeks, office to schedule an appointment.  Discharge Instructions    Ambulatory referral to Neurology   Complete by: As directed    An appointment is requested in approximately: 4-6 weeks   Ambulatory referral to Occupational Therapy   Complete by: As directed    Ambulatory referral to Speech Therapy   Complete by: As directed      35 minutes were spent preparing discharge. This plan of care was directed by Dr. Pearlean Brownie.  Delila A Bailey-Modzik, NP-C I have personally obtained  history,examined this patient, reviewed notes, independently viewed imaging studies, participated in medical decision making and plan of care.ROS completed by me personally and pertinent positives fully documented  I have made any additions or clarifications directly to the above note. Agree with note above.   Delia Heady, MD  Medical Director [facility] Pager: [phone] 05/30/2020 5:49 PM

## 2020-05-30 NOTE — TOC Transition Note (Signed)
Transition of Care [facility]) - CM/SW Discharge Note   Patient Details  Name: Christina [name] MRN: [id number] Date of Birth: [birth year]  Transition of [facility] CM/SW Contact:  Kermit Balo, RN Phone Number: 05/30/2020, 12:35 PM   Clinical Narrative:    Patient is discharging home with outpatient rehab. Information on the AVS.  Pt has transportation home.  Final next level of care: OP Rehab Barriers to Discharge: No Barriers Identified   Patient Goals and CMS Choice     Choice offered to / list presented to : Patient  Discharge Placement                       Discharge Plan and Services   Discharge Planning Services: CM Consult                                 Social Determinants of Health (SDOH) Interventions     Readmission Risk Interventions No flowsheet data found.

## 2020-05-30 NOTE — Progress Notes (Signed)
Dr. Selina Cooley informed of need of pt's post MRI.

## 2020-05-30 NOTE — Plan of Care (Signed)

## 2020-08-14 ENCOUNTER — Ambulatory Visit (INDEPENDENT_AMBULATORY_CARE_PROVIDER_SITE_OTHER): Payer: BC Managed Care – PPO | Admitting: Neurology

## 2020-08-14 ENCOUNTER — Encounter: Payer: Self-pay | Admitting: Neurology

## 2020-08-14 VITALS — BP 157/109 | HR 79 | Ht 67.0 in | Wt 292.8 lb

## 2020-08-14 DIAGNOSIS — I639 Cerebral infarction, unspecified: Secondary | ICD-10-CM

## 2020-08-14 NOTE — Progress Notes (Signed)
[facility] [address] [place]. [place] [address] [phone]       OFFICE FOLLOW-UP NOTE  [name] Date of Birth:  1980-06-07 Medical Record Number:  [id number]   HPI: [name] is a 40 year old African-American lady seen today for initial office follow-up visit following hospital admission for stroke in April 2022.  History is obtained from the patient, review of electronic medical records and I personally reviewed available pertinent imaging films in PACS. She has past medical history of diabetes, hypertension, hyperlipidemia and asthma.  She presented on 05/27/2020 to the emergency room with sudden onset of left hemiparesis, left-sided neglect and right gaze preference.  Code stroke was activated and CT angiogram showed right M1 stenosis.  Patient was given IV tPA and went for emergent mechanical thrombectomy which was successfully performed she was kept in the ICU and blood pressure adequately controlled.  Follow-up MRI scan showed small patchy infarcts in the right MCA and right MCA/ACA and PCA watershed territories.  2D echo showed normal ejection fraction with grade 2 diastolic dysfunction.  Transesophageal echo showed no cardiac source of embolism or PFO.  Transcranial Doppler bubble study was negative for PFO.  Lower extremity venous Dopplers was negative for DVT.  Hypercoagulable labs showed normal anticardiolipin antibodies and lupus anticoagulant was negative.  ANA was negative.  Hemoglobin A1c was elevated at 9.7 and LDL cholesterol was low at 15 mg percent.  Patient was advised to quit smoking.  She has placed on dual antiplatelet therapy for 3 weeks followed by aspirin alone.  She is done well.  She states she has no residual deficits from her stroke.  She feels that short-term memory at times may be difficult and she may have trouble with multitasking.  She is tolerating aspirin well without bruising or bleeding.  Blood pressure she claims is well  controlled but today it is elevated in office at 157/109.  She is tolerating Crestor well without muscle aches and pains and states her sugars have all been good.  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  She has no new complaints today.  She is likely at risk for sleep apnea and has not yet had a polysomnogram for sleep evaluation.  There is no family history of strokes or heart attacks at a young age.  She has no history of DVT, pulmonary embolism or migraines. ROS:   14 system review of systems is positive for diminished fine motor skills, short-term memory difficulties and all the systems negative  PMH:  Past Medical History:  Diagnosis Date   Asthma    Diabetes mellitus without complication (HCC)    Hypertension    Stroke [facility])     Social History:  Social History   Socioeconomic History   Marital status: Married    Spouse name: Ternell   Number of children: Not on file   Years of education: Not on file   Highest education level: Not on file  Occupational History   Not on file  Tobacco Use   Smoking status: Every Day    Packs/day: 0.25    Pack years: 0.00    Types: Cigarettes   Smokeless tobacco: Never  Vaping Use   Vaping Use: Never used  Substance and Sexual Activity   Alcohol use: Yes    Comment: occ   Drug use: No   Sexual activity: Not on file  Other Topics Concern   Not on file  Social History Narrative  Lives with husband and 3 children   Right Handed   Drinks caffeine occasionally   Social Determinants of Health   Financial Resource Strain: Not on file  Food Insecurity: Not on file  Transportation Needs: Not on file  Physical Activity: Not on file  Stress: Not on file  Social Connections: Not on file  Intimate Partner Violence: Not on file    Medications:   Current Outpatient Medications on File Prior to Visit  Medication Sig Dispense Refill   albuterol (PROVENTIL HFA;VENTOLIN HFA) 108 (90 BASE) MCG/ACT inhaler Inhale 2 puffs  into the lungs every 6 (six) hours as needed for wheezing.     aspirin EC 81 MG EC tablet Take 1 tablet (81 mg total) by mouth daily. Swallow whole. 30 tablet 11   BREO ELLIPTA 100-25 MCG/INH AEPB Inhale 1 puff into the lungs daily.     ergocalciferol (VITAMIN D2) 1.25 MG (50000 UT) capsule Take 50,000 Units by mouth 2 (two) times a week. Monday and Thursday     fexofenadine (ALLEGRA) 180 MG tablet Take 180 mg by mouth daily.     hydrochlorothiazide (HYDRODIURIL) 25 MG tablet Take 25 mg by mouth daily.     insulin glargine (LANTUS) 100 UNIT/ML injection Inject 34 Units into the skin daily as needed (high sugar).     metFORMIN (GLUCOPHAGE-XR) 500 MG 24 hr tablet Take 1,000 mg by mouth daily with breakfast.     metoprolol tartrate (LOPRESSOR) 100 MG tablet Take 100 mg by mouth daily.     ondansetron (ZOFRAN ODT) 4 MG disintegrating tablet Take 1 tablet (4 mg total) by mouth every 8 (eight) hours as needed for nausea or vomiting. 20 tablet 0   OZEMPIC, 1 MG/DOSE, 4 MG/3ML SOPN Inject 0.75 mLs into the skin every Monday.     rosuvastatin (CRESTOR) 40 MG tablet Take 1 tablet (40 mg total) by mouth daily.     [DISCONTINUED] fluticasone (FLONASE) 50 MCG/ACT nasal spray Place 1 spray into both nostrils daily. 16 g 2   No current facility-administered medications on file prior to visit.    Allergies:   Allergies  Allergen Reactions   Peanut-Containing Drug Products Anaphylaxis   Lisinopril Other (See Comments)    Lip swelling   Penicillins Rash   Tomato Rash    Fresh tomatoes     Physical Exam General: Morbidly obese young African-American lady, seated, in no evident distress Head: head normocephalic and atraumatic.  Neck: supple with no carotid or supraclavicular bruits Cardiovascular: regular rate and rhythm, no murmurs Musculoskeletal: no deformity Skin:  no rash/petichiae Vascular:  Normal pulses all extremities Vitals:   08/14/20 1140  BP: (!) 157/109  Pulse: 79   Neurologic  Exam Mental Status: Awake and fully alert. Oriented to place and time. Recent and remote memory intact. Attention span, concentration and fund of knowledge appropriate. Mood and affect appropriate.  Cranial Nerves: Fundoscopic exam reveals sharp disc margins. Pupils equal, briskly reactive to light. Extraocular movements full without nystagmus. Visual fields full to confrontation. Hearing intact. Facial sensation intact. Face, tongue, palate moves normally and symmetrically.  Motor: Normal bulk and tone. Normal strength in all tested extremity muscles.  Diminished fine finger movements on the left.  Orbits right over left upper extremity. Sensory.: intact to touch ,pinprick .position and vibratory sensation.  Coordination: Rapid alternating movements normal in all extremities. Finger-to-nose and heel-to-shin performed accurately bilaterally. Gait and Station: Arises from chair without difficulty. Stance is normal. Gait demonstrates normal stride length and balance .  Able to heel, toe and tandem walk with mild  difficulty.  Reflexes: 1+ and symmetric. Toes downgoing.   NIHSS  0 Modified Rankin  1   ASSESSMENT: 40 year old African-American lady withCryptogenic acute infarcts within the right MCA and MCA/ACA and MCA/PCA watershed territories with M1 stenosis s/p tPA and thrombectomy with complete revascularization of occluded RT MCA M 1 achieving a TICI 3 revascularization in April 2022.  She has done remarkably well with practically no residual deficits.  Vascular risk factors of diabetes, hypertension, hyperlipidemia , obesity and at risk for sleep apnea     PLAN:  I had a long d/w patient about her recent  cryptogenic stroke, risk for recurrent stroke/TIAs, personally independently reviewed imaging studies and stroke evaluation results and answered questions.Continue aspirin 81 mg daily  for secondary stroke prevention and maintain strict control of hypertension with blood pressure goal below  130/90, diabetes with hemoglobin A1c goal below 6.5% and lipids with LDL cholesterol goal below 70 mg/dL. I also advised the patient to eat a healthy diet with plenty of whole grains, cereals, fruits and vegetables, exercise regularly and maintain ideal body weight . Refer for polysomnogram for sleep apnoea.Followup in the future with my nurse practitioner Shanda Bumps in 3 months or call earlier if necessary. Greater than 50% of time during this 35 minute visit was spent on counseling,explanation of diagnosis, planning of further management, discussion with patient and family and coordination of care Delia Heady, MD Note: This document was prepared with digital dictation and possible smart phrase technology. Any transcriptional errors that result from this process are unintentional

## 2020-08-14 NOTE — Patient Instructions (Signed)
I had a long d/w patient about her recent  cryptogenic stroke, risk for recurrent stroke/TIAs, personally independently reviewed imaging studies and stroke evaluation results and answered questions.Continue aspirin 81 mg daily  for secondary stroke prevention and maintain strict control of hypertension with blood pressure goal below 130/90, diabetes with hemoglobin A1c goal below 6.5% and lipids with LDL cholesterol goal below 70 mg/dL. I also advised the patient to eat a healthy diet with plenty of whole grains, cereals, fruits and vegetables, exercise regularly and maintain ideal body weight . Refer for polysomnogram for sleep apnoea.Followup in the future with my nurse practitioner Shanda Bumps in 3 months or call earlier if necessary. Stroke Prevention Some medical conditions and behaviors are associated with a higher chance of having a stroke. You can help prevent a stroke by making nutrition, lifestyle,and other changes, including managing any medical conditions you may have. What nutrition changes can be made?  Eat healthy foods. You can do this by: Choosing foods high in fiber, such as fresh fruits and vegetables and whole grains. Eating at least 5 or more servings of fruits and vegetables a day. Try to fill half of your plate at each meal with fruits and vegetables. Choosing lean protein foods, such as lean cuts of meat, poultry without skin, fish, tofu, beans, and nuts. Eating low-fat dairy products. Avoiding foods that are high in salt (sodium). This can help lower blood pressure. Avoiding foods that have saturated fat, trans fat, and cholesterol. This can help prevent high cholesterol. Avoiding processed and premade foods. Follow your health care provider's specific guidelines for losing weight, controlling high blood pressure (hypertension), lowering high cholesterol, and managing diabetes. These may include: Reducing your daily calorie intake. Limiting your daily sodium intake to 1,500  milligrams (mg). Using only healthy fats for cooking, such as olive oil, canola oil, or sunflower oil. Counting your daily carbohydrate intake. What lifestyle changes can be made? Maintain a healthy weight. Talk to your health care provider about your ideal weight. Get at least 30 minutes of moderate physical activity at least 5 days a week. Moderate activity includes brisk walking, biking, and swimming. Do not use any products that contain nicotine or tobacco, such as cigarettes and e-cigarettes. If you need help quitting, ask your health care provider. It may also be helpful to avoid exposure to secondhand smoke. Limit alcohol intake to no more than 1 drink a day for nonpregnant women and 2 drinks a day for men. One drink equals 12 oz of beer, 5 oz of wine, or 1 oz of hard liquor. Stop any illegal drug use. Avoid taking birth control pills. Talk to your health care provider about the risks of taking birth control pills if: You are over 60 years old. You smoke. You get migraines. You have ever had a blood clot. What other changes can be made? Manage your cholesterol levels. Eating a healthy diet is important for preventing high cholesterol. If cholesterol cannot be managed through diet alone, you may also need to take medicines. Take any prescribed medicines to control your cholesterol as told by your health care provider. Manage your diabetes. Eating a healthy diet and exercising regularly are important parts of managing your blood sugar. If your blood sugar cannot be managed through diet and exercise, you may need to take medicines. Take any prescribed medicines to control your diabetes as told by your health care provider. Control your hypertension. To reduce your risk of stroke, try to keep your blood pressure below 130/80.  Eating a healthy diet and exercising regularly are an important part of controlling your blood pressure. If your blood pressure cannot be managed through diet and  exercise, you may need to take medicines. Take any prescribed medicines to control hypertension as told by your health care provider. Ask your health care provider if you should monitor your blood pressure at home. Have your blood pressure checked every year, even if your blood pressure is normal. Blood pressure increases with age and some medical conditions. Get evaluated for sleep disorders (sleep apnea). Talk to your health care provider about getting a sleep evaluation if you snore a lot or have excessive sleepiness. Take over-the-counter and prescription medicines only as told by your health care provider. Aspirin or blood thinners (antiplatelets or anticoagulants) may be recommended to reduce your risk of forming blood clots that can lead to stroke. Make sure that any other medical conditions you have, such as atrial fibrillation or atherosclerosis, are managed. What are the warning signs of a stroke? The warning signs of a stroke can be easily remembered as BEFAST. B is for balance. Signs include: Dizziness. Loss of balance or coordination. Sudden trouble walking. E is for eyes. Signs include: A sudden change in vision. Trouble seeing. F is for face. Signs include: Sudden weakness or numbness of the face. The face or eyelid drooping to one side. A is for arms. Signs include: Sudden weakness or numbness of the arm, usually on one side of the body. S is for speech. Signs include: Trouble speaking (aphasia). Trouble understanding. T is for time. These symptoms may represent a serious problem that is an emergency. Do not wait to see if the symptoms will go away. Get medical help right away. Call your local emergency services (911 in the [place] Do not drive yourself to the hospital. Other signs of stroke may include: A sudden, severe headache with no known cause. Nausea or vomiting. Seizure. Where to find more information For more information, visit: American Stroke Association:  www.strokeassociation.org National Stroke Association: www.stroke.org Summary You can prevent a stroke by eating healthy, exercising, not smoking, limiting alcohol intake, and managing any medical conditions you may have. Do not use any products that contain nicotine or tobacco, such as cigarettes and e-cigarettes. If you need help quitting, ask your health care provider. It may also be helpful to avoid exposure to secondhand smoke. Remember BEFAST for warning signs of stroke. Get help right away if you or a loved one has any of these signs. This information is not intended to replace advice given to you by your health care provider. Make sure you discuss any questions you have with your healthcare provider. Document Revised: 01/02/2017 Document Reviewed: 02/26/2016 Elsevier Patient Education  2021 [employer].

## 2020-08-28 ENCOUNTER — Other Ambulatory Visit: Payer: Self-pay

## 2020-08-28 NOTE — Patient Outreach (Signed)
[facility]) Care Management  08/28/2020  Christina [name] 1980-04-07 [id number]   No Telephone outreach to patient to obtain mRS. Has been successfully completed by Dr. Pearlean Brownie on 08/14/20. MRS= 1   [name] [facility] Care Management Assistant

## 2020-10-17 ENCOUNTER — Institutional Professional Consult (permissible substitution): Payer: BC Managed Care – PPO | Admitting: Neurology

## 2020-11-19 ENCOUNTER — Encounter: Payer: Self-pay | Admitting: Adult Health

## 2020-11-19 ENCOUNTER — Ambulatory Visit: Payer: No Typology Code available for payment source | Admitting: Adult Health

## 2020-11-19 NOTE — Progress Notes (Deleted)
[facility] [address] [place]. [place] [address] [phone]       OFFICE FOLLOW-UP NOTE  Christina [name] Date of Birth:  [birth year] Medical Record Number:  [id number]   Primary neurologist: Dr. Pearlean Brownie Reason for visit: Stroke follow-up   No chief complaint on file.    HPI:   Update 11/19/2020 JM: Returns for 38-month stroke follow-up.  Overall stable.  Denies new or reoccurring stroke/TIA symptoms.  Compliant on aspirin 81 mg daily and Crestor 20 mg daily without side effects.  Recent lipid panel LDL 103 (prior 15) - PCP decrease statin dosage from 40 mg to 20 mg as HDL is 50.  Blood pressure today ***.  Most recent A1c 6.0 routinely monitored by PCP.  Initial visit with Dr. Frances Furbish scheduled 12/25/2020 for suspected underlying sleep apnea.      History provided for reference purposes only Initial visit 08/14/2020 Dr. Pearlean Brownie: Christina [name] is a 40 year old African-American lady seen today for initial office follow-up visit following hospital admission for stroke in April 2022.  History is obtained from the patient, review of electronic medical records and I personally reviewed available pertinent imaging films in PACS. She has past medical history of diabetes, hypertension, hyperlipidemia and asthma.  She presented on 05/27/2020 to the emergency room with sudden onset of left hemiparesis, left-sided neglect and right gaze preference.  Code stroke was activated and CT angiogram showed right M1 stenosis.  Patient was given IV tPA and went for emergent mechanical thrombectomy which was successfully performed she was kept in the ICU and blood pressure adequately controlled.  Follow-up MRI scan showed small patchy infarcts in the right MCA and right MCA/ACA and PCA watershed territories.  2D echo showed normal ejection fraction with grade 2 diastolic dysfunction.  Transesophageal echo showed no cardiac source of embolism or PFO.  Transcranial Doppler bubble study was negative  for PFO.  Lower extremity venous Dopplers was negative for DVT.  Hypercoagulable labs showed normal anticardiolipin antibodies and lupus anticoagulant was negative.  ANA was negative.  Hemoglobin A1c was elevated at 9.7 and LDL cholesterol was low at 15 mg percent.  Patient was advised to quit smoking.  She has placed on dual antiplatelet therapy for 3 weeks followed by aspirin alone.  She is done well.  She states she has no residual deficits from her stroke.  She feels that short-term memory at times may be difficult and she may have trouble with multitasking.  She is tolerating aspirin well without bruising or bleeding.  Blood pressure she claims is well controlled but today it is elevated in office at 157/109.  She is tolerating Crestor well without muscle aches and pains and states her sugars have all been good.  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  She has no new complaints today.  She is likely at risk for sleep apnea and has not yet had a polysomnogram for sleep evaluation.  There is no family history of strokes or heart attacks at a young age.  She has no history of DVT, pulmonary embolism or migraines.  ROS:   14 system review of systems is positive for diminished fine motor skills, short-term memory difficulties and all the systems negative  PMH:  Past Medical History:  Diagnosis Date   Asthma    Diabetes mellitus without complication (HCC)    Hypertension    Stroke [facility])     Social History:  Social History   Socioeconomic History   Marital  status: Married    Spouse name: Ternell   Number of children: Not on file   Years of education: Not on file   Highest education level: Not on file  Occupational History   Not on file  Tobacco Use   Smoking status: Every Day    Packs/day: 0.25    Types: Cigarettes   Smokeless tobacco: Never  Vaping Use   Vaping Use: Never used  Substance and Sexual Activity   Alcohol use: Yes    Comment: occ   Drug use: No    Sexual activity: Not on file  Other Topics Concern   Not on file  Social History Narrative   Lives with husband and 3 children   Right Handed   Drinks caffeine occasionally   Social Determinants of Health   Financial Resource Strain: Not on file  Food Insecurity: Not on file  Transportation Needs: Not on file  Physical Activity: Not on file  Stress: Not on file  Social Connections: Not on file  Intimate Partner Violence: Not on file    Medications:   Current Outpatient Medications on File Prior to Visit  Medication Sig Dispense Refill   albuterol (PROVENTIL HFA;VENTOLIN HFA) 108 (90 BASE) MCG/ACT inhaler Inhale 2 puffs into the lungs every 6 (six) hours as needed for wheezing.     aspirin EC 81 MG EC tablet Take 1 tablet (81 mg total) by mouth daily. Swallow whole. 30 tablet 11   BREO ELLIPTA 100-25 MCG/INH AEPB Inhale 1 puff into the lungs daily.     ergocalciferol (VITAMIN D2) 1.25 MG (50000 UT) capsule Take 50,000 Units by mouth 2 (two) times a week. Monday and Thursday     fexofenadine (ALLEGRA) 180 MG tablet Take 180 mg by mouth daily.     hydrochlorothiazide (HYDRODIURIL) 25 MG tablet Take 25 mg by mouth daily.     insulin glargine (LANTUS) 100 UNIT/ML injection Inject 34 Units into the skin daily as needed (high sugar).     metFORMIN (GLUCOPHAGE-XR) 500 MG 24 hr tablet Take 1,000 mg by mouth daily with breakfast.     metoprolol tartrate (LOPRESSOR) 100 MG tablet Take 100 mg by mouth daily.     ondansetron (ZOFRAN ODT) 4 MG disintegrating tablet Take 1 tablet (4 mg total) by mouth every 8 (eight) hours as needed for nausea or vomiting. 20 tablet 0   OZEMPIC, 1 MG/DOSE, 4 MG/3ML SOPN Inject 0.75 mLs into the skin every Monday.     rosuvastatin (CRESTOR) 40 MG tablet Take 1 tablet (40 mg total) by mouth daily.     [DISCONTINUED] fluticasone (FLONASE) 50 MCG/ACT nasal spray Place 1 spray into both nostrils daily. 16 g 2   No current facility-administered medications on file  prior to visit.    Allergies:   Allergies  Allergen Reactions   Peanut-Containing Drug Products Anaphylaxis   Lisinopril Other (See Comments)    Lip swelling   Penicillins Rash   Tomato Rash    Fresh tomatoes     Physical Exam There were no vitals filed for this visit. There is no height or weight on file to calculate BMI.   General: Morbidly obese young African-American lady, seated, in no evident distress Head: head normocephalic and atraumatic.  Neck: supple with no carotid or supraclavicular bruits Cardiovascular: regular rate and rhythm, no murmurs Musculoskeletal: no deformity Skin:  no rash/petichiae Vascular:  Normal pulses all extremities  Neurologic Exam Mental Status: Awake and fully alert. Oriented to place and time. Recent  and remote memory intact. Attention span, concentration and fund of knowledge appropriate. Mood and affect appropriate.  Cranial Nerves: Pupils equal, briskly reactive to light. Extraocular movements full without nystagmus. Visual fields full to confrontation. Hearing intact. Facial sensation intact. Face, tongue, palate moves normally and symmetrically.  Motor: Normal bulk and tone. Normal strength in all tested extremity muscles.  Diminished fine finger movements on the left.  Orbits right over left upper extremity. Sensory.: intact to touch ,pinprick .position and vibratory sensation.  Coordination: Rapid alternating movements normal in all extremities. Finger-to-nose and heel-to-shin performed accurately bilaterally. Gait and Station: Arises from chair without difficulty. Stance is normal. Gait demonstrates normal stride length and balance . Able to heel, toe and tandem walk with mild  difficulty.  Reflexes: 1+ and symmetric. Toes downgoing.        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  She has done remarkably well with practically no residual deficits.  Vascular risk factors of diabetes, hypertension, hyperlipidemia , obesity and at risk for sleep apnea     PLAN:  -Continue aspirin 81 mg daily  for secondary stroke prevention and maintain strict control of hypertension with blood pressure goal below 130/90, diabetes with hemoglobin A1c goal below 6.5% and lipids with LDL cholesterol goal below 70 mg/dL. -Recent A1c 6.0 (09/28/2020) down from *** -Recent LDL 103 (09/28/2020) previously 15 (05/28/2020) Refer for polysomnogram for sleep apnoea.       CC:  [facility] provider: Clearnce Hasten, PA-C   I spent *** minutes of face-to-face and non-face-to-face time with patient.  This included previsit chart review, lab review, study review, order entry, electronic health record documentation, patient education  Ihor Austin, [facility]  [facility] [address] [place], [place] [address]  Phone [phone] Fax [phone] Note: This document was prepared with digital dictation and possible smart phrase technology. Any transcriptional errors that result from this process are unintentional.

## 2020-12-13 ENCOUNTER — Telehealth: Payer: Self-pay | Admitting: Neurology

## 2020-12-13 NOTE — Telephone Encounter (Signed)
Sleep consult cancelled due to MD out, LVM & sent mychart msg informing pt.

## 2020-12-25 ENCOUNTER — Institutional Professional Consult (permissible substitution): Payer: BC Managed Care – PPO | Admitting: Neurology

## 2021-03-21 ENCOUNTER — Other Ambulatory Visit: Payer: Self-pay

## 2021-03-21 ENCOUNTER — Encounter (HOSPITAL_COMMUNITY): Payer: Self-pay

## 2021-03-21 ENCOUNTER — Emergency Department (HOSPITAL_COMMUNITY): Payer: BC Managed Care – PPO

## 2021-03-21 ENCOUNTER — Observation Stay (HOSPITAL_COMMUNITY): Payer: BC Managed Care – PPO

## 2021-03-21 ENCOUNTER — Inpatient Hospital Stay (HOSPITAL_COMMUNITY)
Admission: EM | Admit: 2021-03-21 | Discharge: 2021-03-25 | DRG: 069 | Disposition: A | Discharge status: Home / Self Care | Payer: BC Managed Care – PPO | Attending: Internal Medicine | Admitting: Internal Medicine

## 2021-03-21 DIAGNOSIS — E785 Hyperlipidemia, unspecified: Secondary | ICD-10-CM | POA: Diagnosis present

## 2021-03-21 DIAGNOSIS — G459 Transient cerebral ischemic attack, unspecified: Secondary | ICD-10-CM | POA: Diagnosis not present

## 2021-03-21 DIAGNOSIS — Z9114 Patient's other noncompliance with medication regimen: Secondary | ICD-10-CM

## 2021-03-21 DIAGNOSIS — E669 Obesity, unspecified: Secondary | ICD-10-CM | POA: Diagnosis present

## 2021-03-21 DIAGNOSIS — I7771 Dissection of carotid artery: Secondary | ICD-10-CM | POA: Diagnosis not present

## 2021-03-21 DIAGNOSIS — E1165 Type 2 diabetes mellitus with hyperglycemia: Secondary | ICD-10-CM | POA: Diagnosis present

## 2021-03-21 DIAGNOSIS — J45909 Unspecified asthma, uncomplicated: Secondary | ICD-10-CM | POA: Diagnosis present

## 2021-03-21 DIAGNOSIS — Z7982 Long term (current) use of aspirin: Secondary | ICD-10-CM

## 2021-03-21 DIAGNOSIS — I639 Cerebral infarction, unspecified: Secondary | ICD-10-CM

## 2021-03-21 DIAGNOSIS — Z833 Family history of diabetes mellitus: Secondary | ICD-10-CM

## 2021-03-21 DIAGNOSIS — Z91010 Allergy to peanuts: Secondary | ICD-10-CM

## 2021-03-21 DIAGNOSIS — Z7985 Long-term (current) use of injectable non-insulin antidiabetic drugs: Secondary | ICD-10-CM

## 2021-03-21 DIAGNOSIS — R29702 NIHSS score 2: Secondary | ICD-10-CM | POA: Diagnosis present

## 2021-03-21 DIAGNOSIS — Z7951 Long term (current) use of inhaled steroids: Secondary | ICD-10-CM

## 2021-03-21 DIAGNOSIS — Z91018 Allergy to other foods: Secondary | ICD-10-CM

## 2021-03-21 DIAGNOSIS — Z7984 Long term (current) use of oral hypoglycemic drugs: Secondary | ICD-10-CM

## 2021-03-21 DIAGNOSIS — R4781 Slurred speech: Secondary | ICD-10-CM | POA: Diagnosis not present

## 2021-03-21 DIAGNOSIS — Z880 Allergy status to penicillin: Secondary | ICD-10-CM

## 2021-03-21 DIAGNOSIS — R29810 Facial weakness: Secondary | ICD-10-CM | POA: Diagnosis present

## 2021-03-21 DIAGNOSIS — E876 Hypokalemia: Secondary | ICD-10-CM | POA: Diagnosis present

## 2021-03-21 DIAGNOSIS — Z8673 Personal history of transient ischemic attack (TIA), and cerebral infarction without residual deficits: Secondary | ICD-10-CM

## 2021-03-21 DIAGNOSIS — Z888 Allergy status to other drugs, medicaments and biological substances status: Secondary | ICD-10-CM

## 2021-03-21 DIAGNOSIS — Z6835 Body mass index (BMI) 35.0-35.9, adult: Secondary | ICD-10-CM

## 2021-03-21 DIAGNOSIS — Z79899 Other long term (current) drug therapy: Secondary | ICD-10-CM

## 2021-03-21 DIAGNOSIS — E119 Type 2 diabetes mellitus without complications: Secondary | ICD-10-CM

## 2021-03-21 DIAGNOSIS — G8191 Hemiplegia, unspecified affecting right dominant side: Secondary | ICD-10-CM | POA: Diagnosis present

## 2021-03-21 DIAGNOSIS — F17210 Nicotine dependence, cigarettes, uncomplicated: Secondary | ICD-10-CM | POA: Diagnosis present

## 2021-03-21 DIAGNOSIS — Z794 Long term (current) use of insulin: Secondary | ICD-10-CM

## 2021-03-21 DIAGNOSIS — R059 Cough, unspecified: Secondary | ICD-10-CM

## 2021-03-21 DIAGNOSIS — Z20822 Contact with and (suspected) exposure to covid-19: Secondary | ICD-10-CM | POA: Diagnosis present

## 2021-03-21 DIAGNOSIS — I10 Essential (primary) hypertension: Secondary | ICD-10-CM | POA: Diagnosis present

## 2021-03-21 DIAGNOSIS — E871 Hypo-osmolality and hyponatremia: Secondary | ICD-10-CM | POA: Diagnosis present

## 2021-03-21 DIAGNOSIS — Z8249 Family history of ischemic heart disease and other diseases of the circulatory system: Secondary | ICD-10-CM

## 2021-03-21 HISTORY — DX: Cerebral infarction, unspecified: I63.9

## 2021-03-21 LAB — I-STAT CHEM 8, ED
BUN: 14 mg/dL (ref 6–20)
Calcium, Ion: 1.07 mmol/L — ABNORMAL LOW (ref 1.15–1.40)
Chloride: 92 mmol/L — ABNORMAL LOW (ref 98–111)
Creatinine, Ser: 1.1 mg/dL — ABNORMAL HIGH (ref 0.44–1.00)
Glucose, Bld: 154 mg/dL — ABNORMAL HIGH (ref 70–99)
HCT: 45 % (ref 36.0–46.0)
Hemoglobin: 15.3 g/dL — ABNORMAL HIGH (ref 12.0–15.0)
Potassium: 3.5 mmol/L (ref 3.5–5.1)
Sodium: 136 mmol/L (ref 135–145)
TCO2: 32 mmol/L (ref 22–32)

## 2021-03-21 LAB — DIFFERENTIAL
Abs Immature Granulocytes: 0.01 10*3/uL (ref 0.00–0.07)
Basophils Absolute: 0.1 10*3/uL (ref 0.0–0.1)
Basophils Relative: 2 %
Eosinophils Absolute: 0.4 10*3/uL (ref 0.0–0.5)
Eosinophils Relative: 11 %
Immature Granulocytes: 0 %
Lymphocytes Relative: 34 %
Lymphs Abs: 1.3 10*3/uL (ref 0.7–4.0)
Monocytes Absolute: 0.5 10*3/uL (ref 0.1–1.0)
Monocytes Relative: 13 %
Neutro Abs: 1.5 10*3/uL — ABNORMAL LOW (ref 1.7–7.7)
Neutrophils Relative %: 40 %

## 2021-03-21 LAB — RESP PANEL BY RT-PCR (FLU A&B, COVID) ARPGX2
Influenza A by PCR: NEGATIVE
Influenza B by PCR: NEGATIVE
SARS Coronavirus 2 by RT PCR: NEGATIVE

## 2021-03-21 LAB — CBC
HCT: 43.7 % (ref 36.0–46.0)
Hemoglobin: 14.2 g/dL (ref 12.0–15.0)
MCH: 30.7 pg (ref 26.0–34.0)
MCHC: 32.5 g/dL (ref 30.0–36.0)
MCV: 94.4 fL (ref 80.0–100.0)
Platelets: 303 10*3/uL (ref 150–400)
RBC: 4.63 MIL/uL (ref 3.87–5.11)
RDW: 11.8 % (ref 11.5–15.5)
WBC: 3.8 10*3/uL — ABNORMAL LOW (ref 4.0–10.5)
nRBC: 0 % (ref 0.0–0.2)

## 2021-03-21 LAB — COMPREHENSIVE METABOLIC PANEL
ALT: 16 U/L (ref 0–44)
AST: 21 U/L (ref 15–41)
Albumin: 3.8 g/dL (ref 3.5–5.0)
Alkaline Phosphatase: 43 U/L (ref 38–126)
Anion gap: 11 (ref 5–15)
BUN: 12 mg/dL (ref 6–20)
CO2: 29 mmol/L (ref 22–32)
Calcium: 8.9 mg/dL (ref 8.9–10.3)
Chloride: 94 mmol/L — ABNORMAL LOW (ref 98–111)
Creatinine, Ser: 1.03 mg/dL — ABNORMAL HIGH (ref 0.44–1.00)
GFR, Estimated: >60 mL/min (ref >60)
Glucose, Bld: 157 mg/dL — ABNORMAL HIGH (ref 70–99)
Potassium: 3.5 mmol/L (ref 3.5–5.1)
Sodium: 134 mmol/L — ABNORMAL LOW (ref 135–145)
Total Bilirubin: 1.2 mg/dL (ref 0.3–1.2)
Total Protein: 7.9 g/dL (ref 6.5–8.1)

## 2021-03-21 LAB — I-STAT BETA HCG BLOOD, ED (MC, WL, AP ONLY): I-stat hCG, quantitative: <5 m[IU]/mL (ref <5)

## 2021-03-21 LAB — PROTIME-INR
INR: 1.1 (ref 0.8–1.2)
Prothrombin Time: 14.6 seconds (ref 11.4–15.2)

## 2021-03-21 LAB — CBG MONITORING, ED
Glucose-Capillary: 151 mg/dL — ABNORMAL HIGH (ref 70–99)
Glucose-Capillary: 96 mg/dL (ref 70–99)

## 2021-03-21 LAB — APTT: aPTT: 29 seconds (ref 24–36)

## 2021-03-21 LAB — ETHANOL: Alcohol, Ethyl (B): <10 mg/dL (ref <10)

## 2021-03-21 IMAGING — MR MR MRA NECK WO/W CM
4 of 7 series · 19 of 48 positions shown · IV contrast (10 ML GAD)
Comparison: CTA head and neck [DATE]

CLINICAL DATA: Abnormal CTA with filling defect right ICA

EXAM:
MRA NECK WITHOUT AND WITH CONTRAST
TECHNIQUE: Multiplanar and multiecho pulse sequences of the neck were obtained
without and with intravenous contrast. Angiographic images of the
neck were obtained using MRA technique without and with intravenous
contrast.
CONTRAST:  10mL GADAVIST GADOBUTROL 1 MMOL/ML IV SOLN

[Series 400: cor cemra ft · coronal · 1.2mm · 0.59mm/px · 7 of 129 slices shown]
[im 1/129]
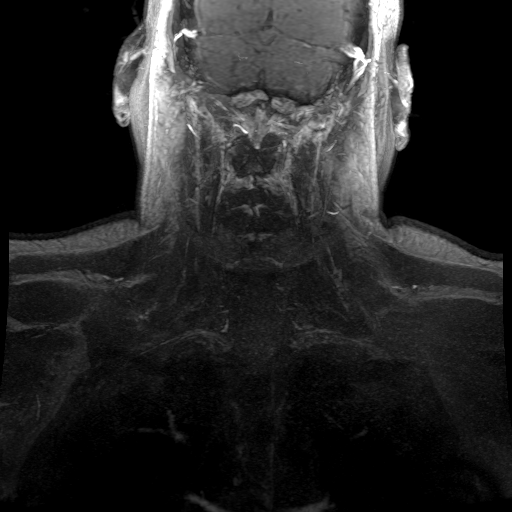
[im 22/129]
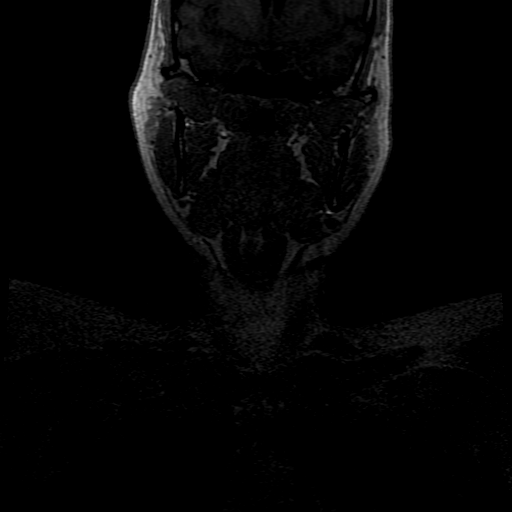
[im 43/129]
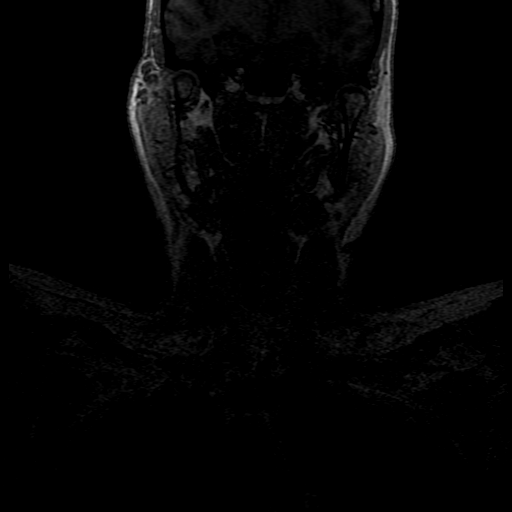
[im 65/129]
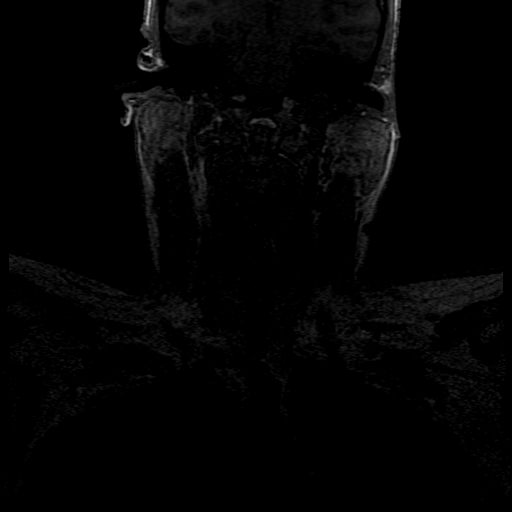
[im 86/129]
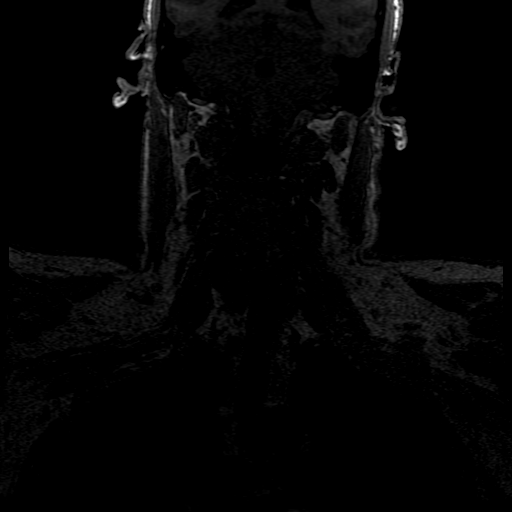
[im 107/129]
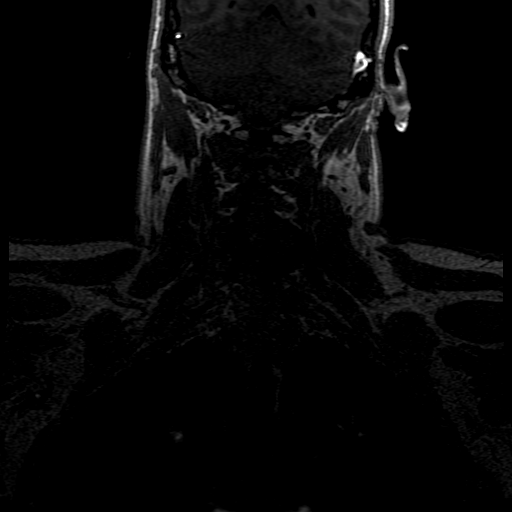
[im 129/129]
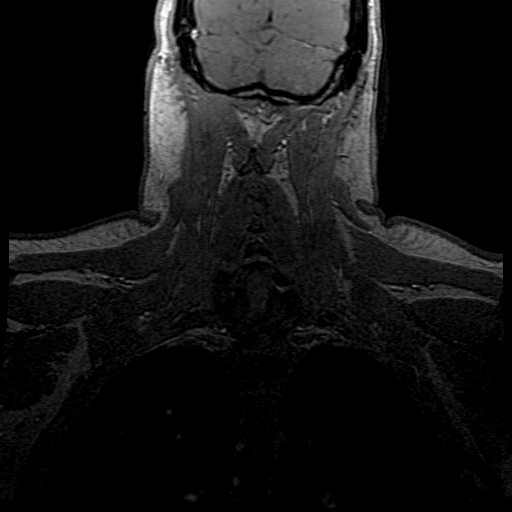

[Series 401: ph1/cor cemra ft · coronal · 1.2mm · 0.59mm/px · 6 of 128 slices shown]
[im 1/128]
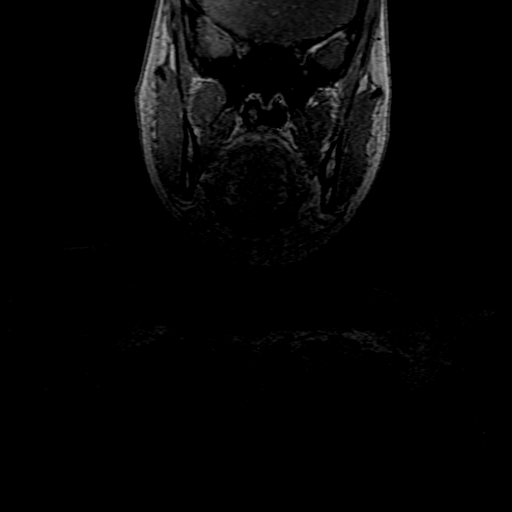
[im 19/128]
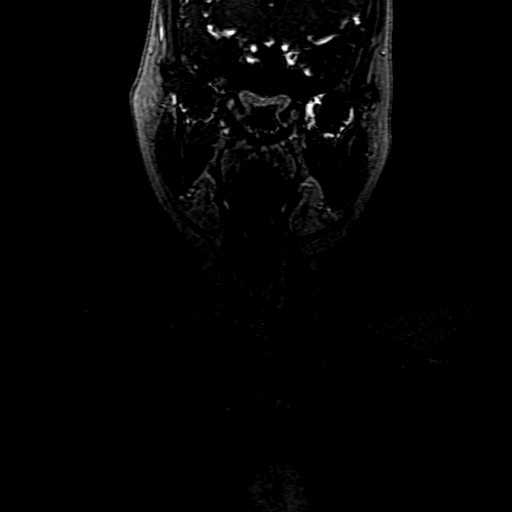
[im 37/128]
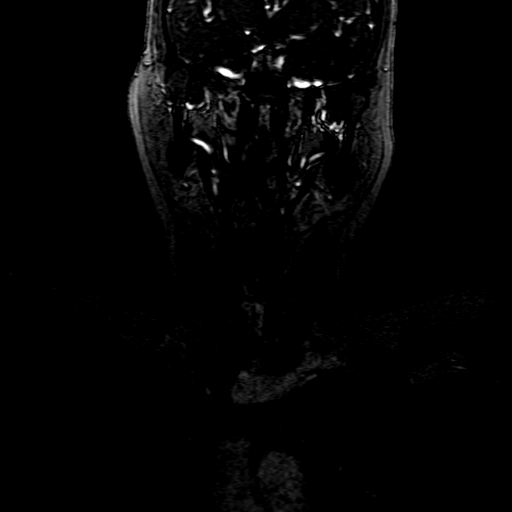
[im 55/128]
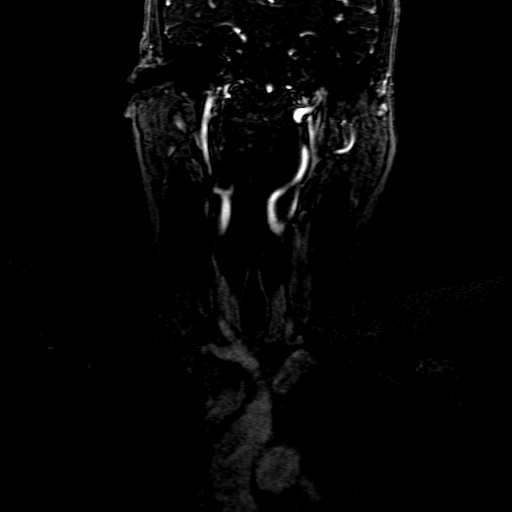
[im 73/128]
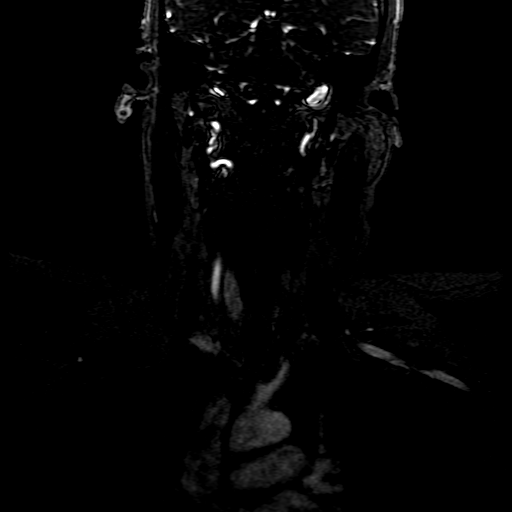
[im 109/128]
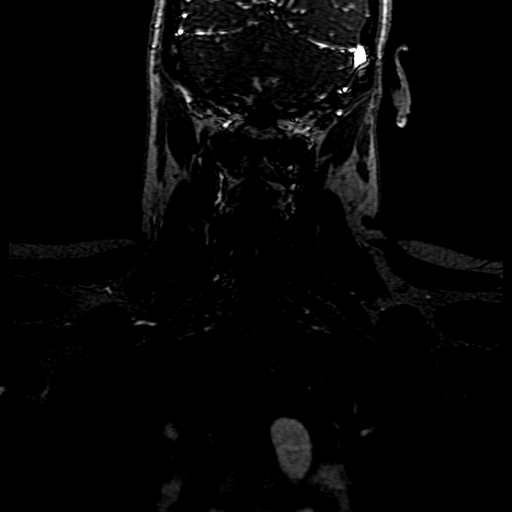

[Series 402: ph2/cor cemra ft · coronal · 1.2mm · 0.59mm/px · 3 of 128 slices shown]
[im 19/128]
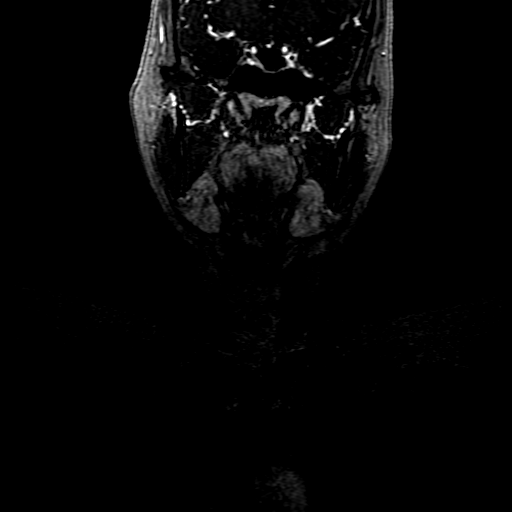
[im 73/128]
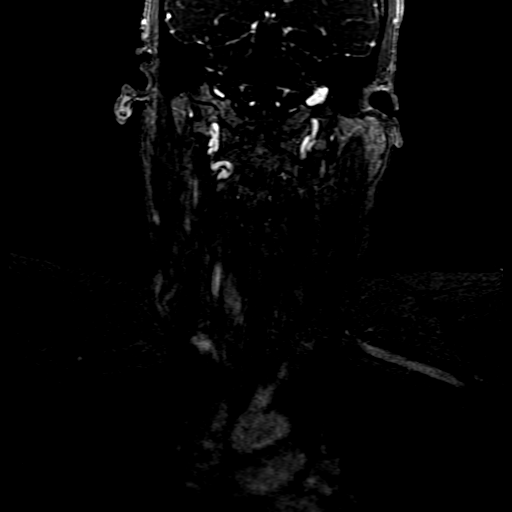
[im 109/128]
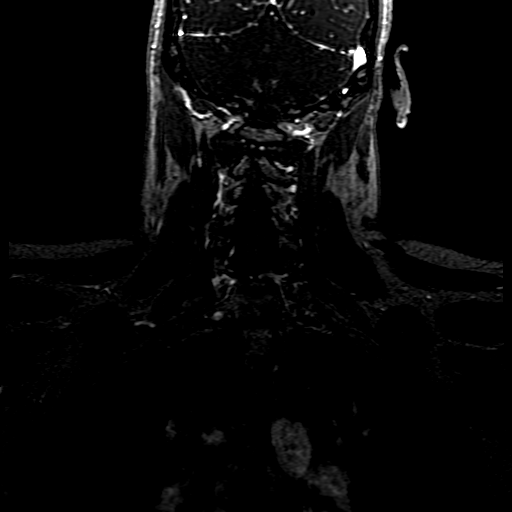

[((date))-((date)) · coronal · 1.2mm · 0.59mm/px · 3 of 129 slices shown]
[im 19/129]
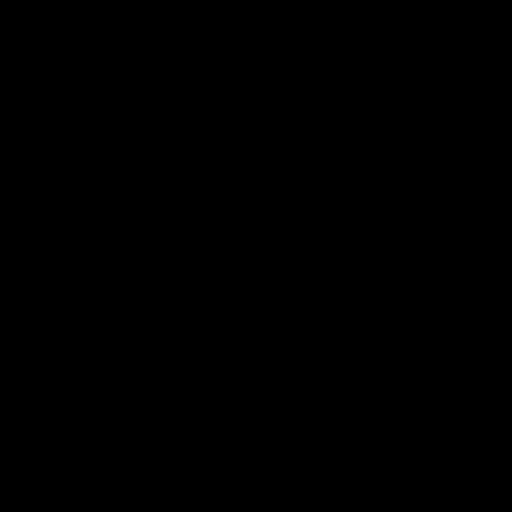
[im 74/129]
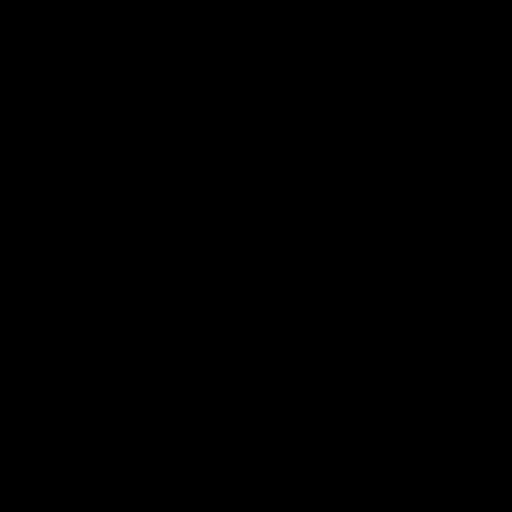
[im 110/129]
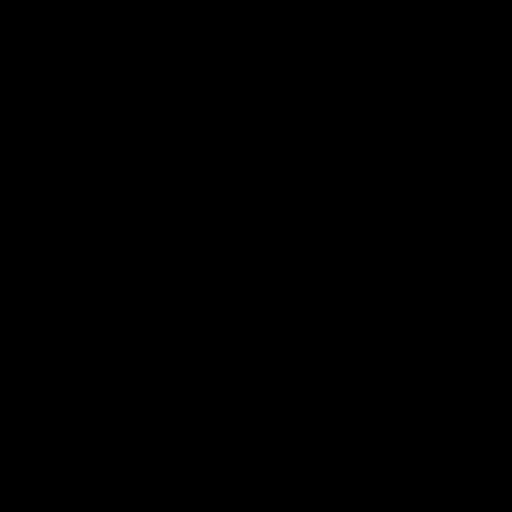

[19 of 48 positions shown; findings below may reference images not displayed]

FINDINGS: MRI degraded by motion.

MRA confirms a filling defect in the proximal right ICA causing mild
narrowing of the lumen. Remainder of the right internal carotid
artery normal

Left carotid bifurcation normal without stenosis or irregularity

Both vertebral arteries normal and widely patent.

Antegrade flow in the carotid and vertebral arteries bilaterally.
IMPRESSION: Filling defect in the proximal right ICA causing mild luminal
stenosis. This confirms the finding on CT angiogram. This could be
due to underlying plaque rupture or dissection.

These results were called by telephone at the time of interpretation
on [DATE] at [DATE] to provider ABEDALAZEEZ, who verbally
acknowledged these results.

## 2021-03-21 IMAGING — CT CT ANGIO HEAD-NECK (W OR W/O PERF)
2 of 7 series · 8 of 33 positions shown · non-contrast
Comparison: CTA [DATE].

CLINICAL DATA: Neuro deficit, acute, stroke suspected

EXAM:
CT ANGIOGRAPHY HEAD AND NECK
TECHNIQUE: Multidetector CT imaging of the head and neck was performed using
the standard protocol during bolus administration of intravenous
contrast. Multiplanar CT image reconstructions and MIPs were
obtained to evaluate the vascular anatomy. Carotid stenosis
measurements (when applicable) are obtained utilizing NASCET
criteria, using the distal internal carotid diameter as the
denominator.

[Series 5: cta neck · axial · 0.47mm/px · z∈[-216,-102]mm · 2 of 171 slices shown]
[im 57/171  soft-tissue]
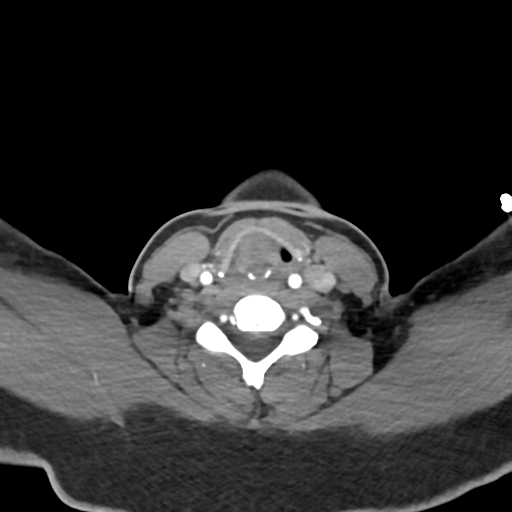
[im 114/171  soft-tissue]
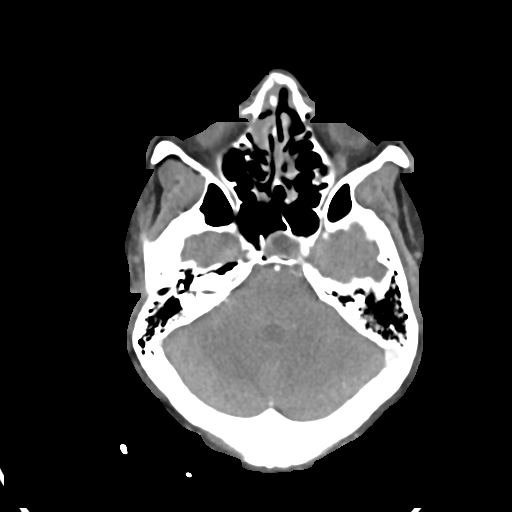

[Series 7: cta neck axial · axial · 0.39mm/px · z∈[-279,-36]mm · 6 of 341 slices shown]
[im 49/341  soft-tissue]
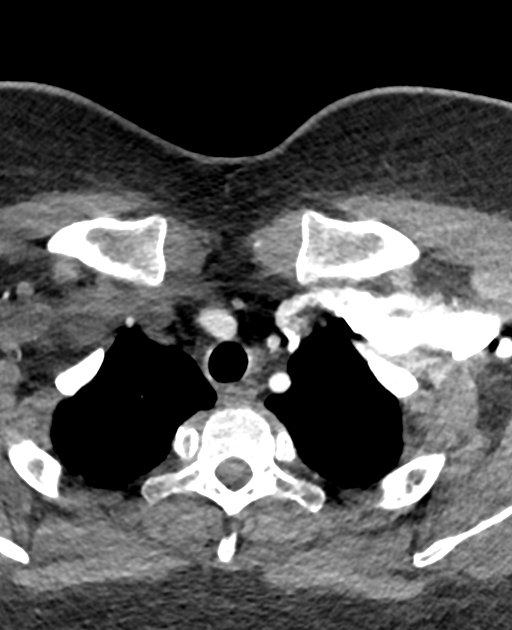
[im 98/341  bone]
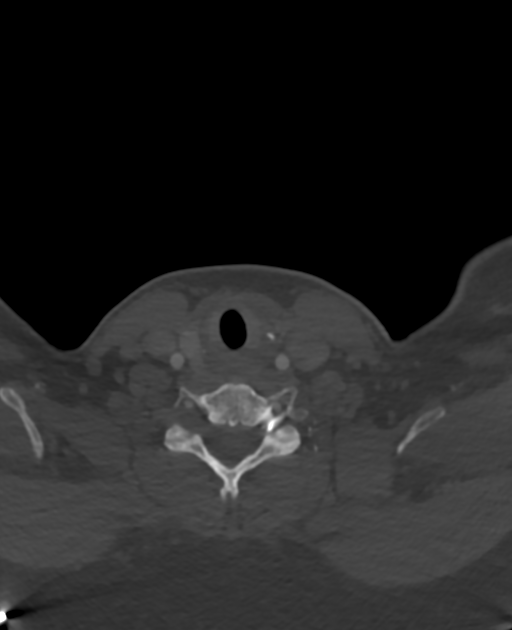
[im 146/341  soft-tissue]
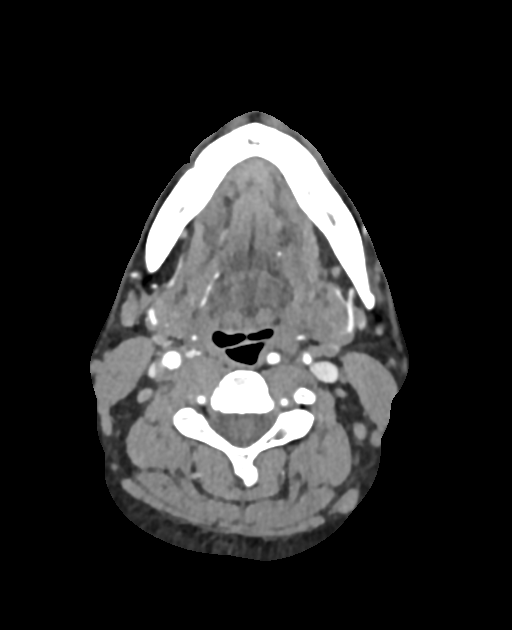
[im 195/341  bone]
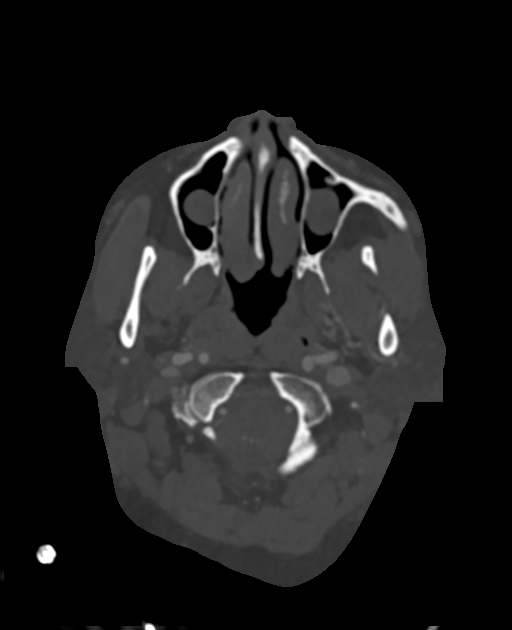
[im 243/341  soft-tissue]
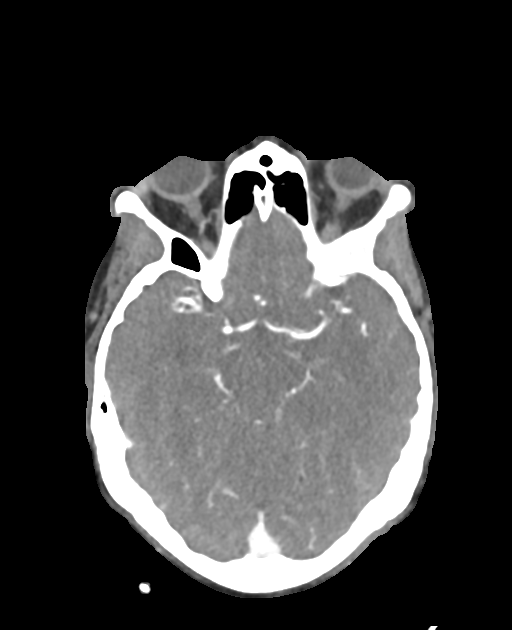
[im 292/341  bone]
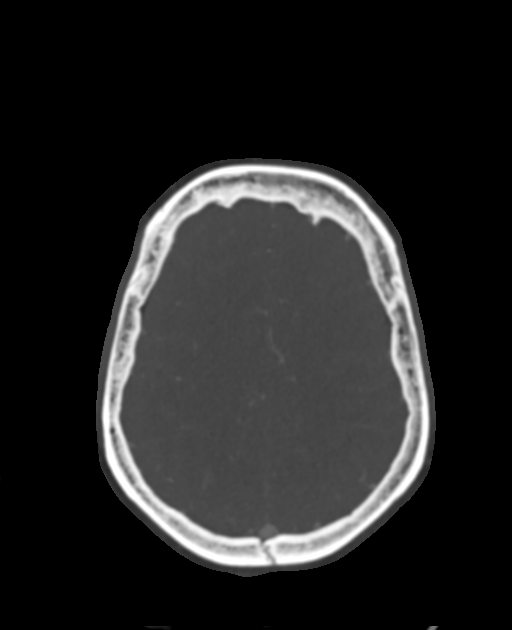

[8 of 33 positions shown; findings below may reference images not displayed]

RADIATION DOSE REDUCTION: This exam was performed according to the
departmental dose-optimization program which includes automated
exposure control, adjustment of the mA and/or kV according to
patient size and/or use of iterative reconstruction technique.

CONTRAST:  60mL OMNIPAQUE IOHEXOL 350 MG/ML SOLN
FINDINGS: CTA NECK FINDINGS

Aortic arch: Great vessel origins are patent.

Right carotid system: Common carotid artery is patent without
significant stenosis. There is eccentric intraluminal filling defect
within the proximal right ICA, which is not apparent on the
comparison prior CTA. This filling defect also appears to extend
into the lumen more superiorly. No significant (greater than 50%)
stenosis. Tortuous ICA.

Left carotid system: No evidence of dissection, stenosis (50% or
greater) or occlusion. Retropharyngeal course. Tortuous ICA.

Vertebral arteries: Mildly right dominant. Proximal left vertebral
artery is obscured by streak artifact from pooled venous contrast.
Within this limitation, no visible significant (greater than 50%)
stenosis. The visible vertebral arteries are patent bilaterally.

Skeleton: No acute abnormality on limited assessment.

Other neck: No acute abnormality on limited assessment.

Upper chest: Visualized lung apices are clear.

Review of the MIP images confirms the above findings

CTA HEAD FINDINGS

Anterior circulation: Bilateral intracranial ICAs are patent without
significant stenosis. The proximal bilateral MCAs and ACAs are
patent without proximal hemodynamically significant stenosis.
Tortuous right M2 MCA branch loops back on itself, but appears to
remain patent. No aneurysm identified.

Posterior circulation: Bilateral intradural vertebral arteries,
basilar artery, and posterior cerebral arteries are patent without
proximal hemodynamically significant stenosis. Mild bilateral PCA
stenosis. Small left P1 PCA with prominent left posterior
communicating artery, anatomic variant. No aneurysm identified.

Venous sinuses: As permitted by contrast timing, patent.

Review of the MIP images confirms the above findings
IMPRESSION: 1. New filling defect in the proximal right ICA in the neck,
concerning for intraluminal clot and possible plaque rupture.
2. No large vessel occlusion or proximal hemodynamically significant
stenosis in the head or neck.

Findings discussed with Dr. SHARJI via telephone at [DATE] p.m.

## 2021-03-21 IMAGING — DX DG CHEST 1V PORT
1 series · 1 of 1 positions shown · non-contrast
Comparison: [DATE]

CLINICAL DATA: Cough

EXAM:
PORTABLE CHEST 1 VIEW

[chest ap]
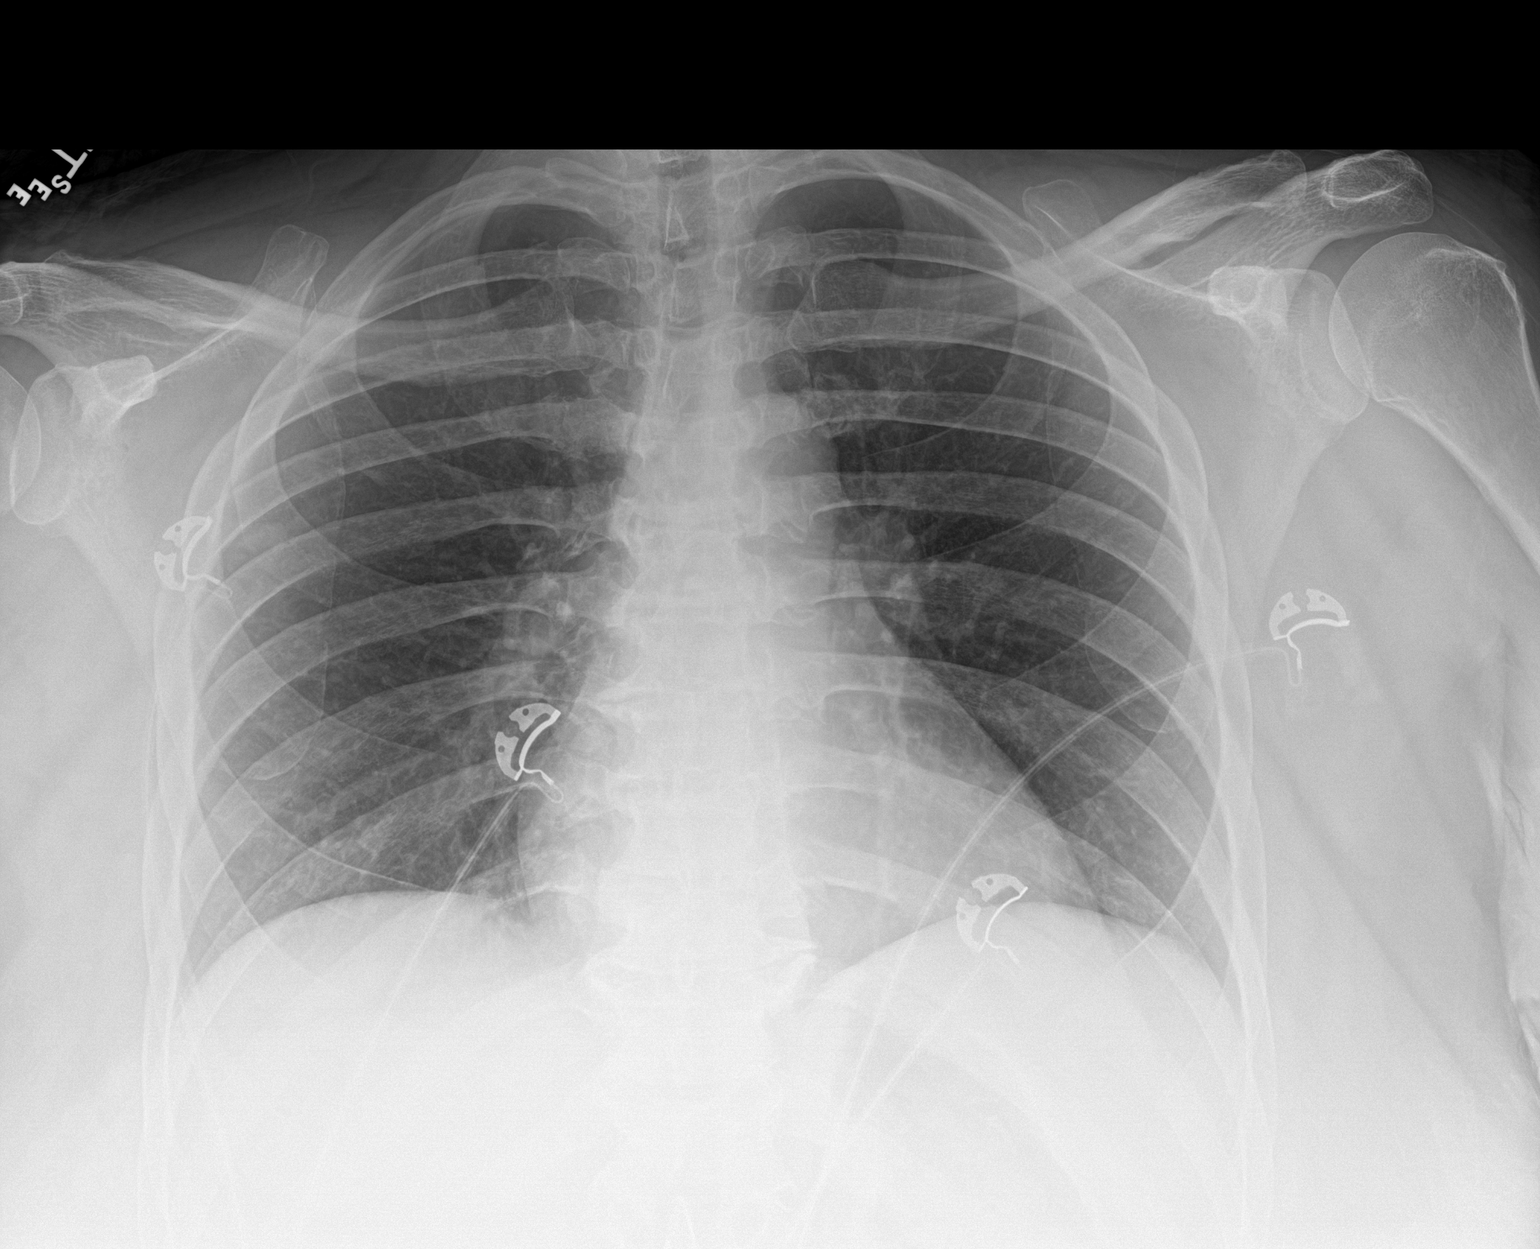

[1 of 1 positions shown; findings below may reference images not displayed]

FINDINGS: The heart size and mediastinal contours are within normal limits.
Both lungs are clear. The visualized skeletal structures are
unremarkable.
IMPRESSION: No active disease.

## 2021-03-21 IMAGING — MR MR HEAD W/O CM
6 of 11 series · 24 of 48 positions shown · non-contrast
Comparison: MRI [DATE].

CLINICAL DATA: Neuro deficit, acute, stroke suspected

EXAM:
MRI HEAD WITHOUT CONTRAST
TECHNIQUE: Multiplanar, multiecho pulse sequences of the brain and surrounding
structures were obtained without intravenous contrast.

[Series 2: DWI · axial · 3.0mm · 0.94mm/px · z∈[-45,+102]mm · 7 of 100 slices shown (1 of 2)]
[im 1/100]
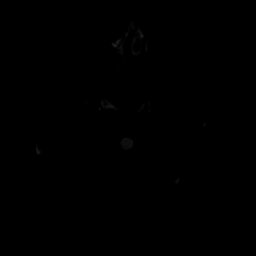
[im 17/100]
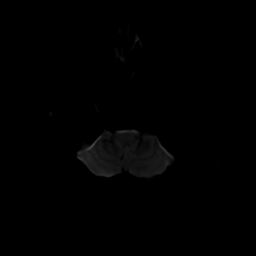
[im 34/100]
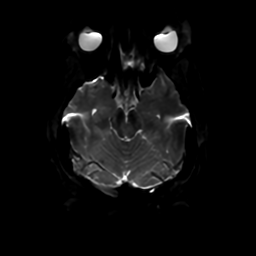
[im 50/100]
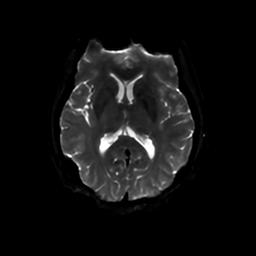
[im 67/100]
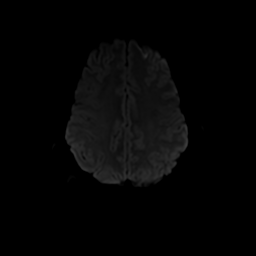
[im 83/100]
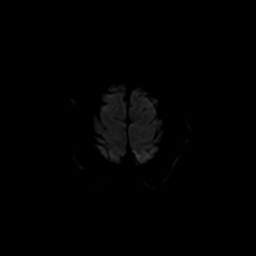
[im 100/100]
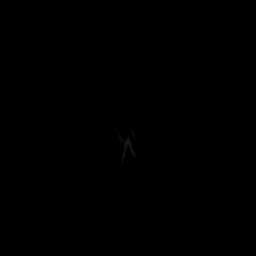

[Series 3: DWI · coronal · 4.0mm · 0.94mm/px · 5 of 73 slices shown (2 of 2)]
[im 1/73]
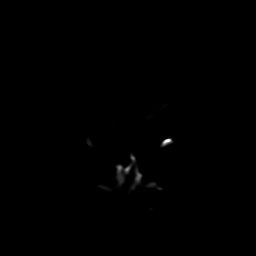
[im 19/73]
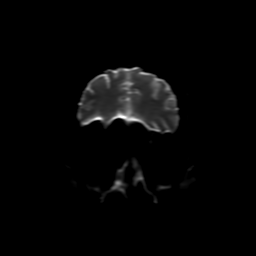
[im 37/73]
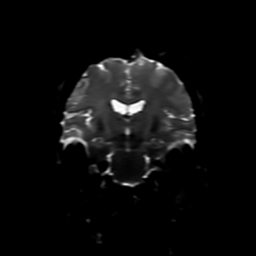
[im 55/73]
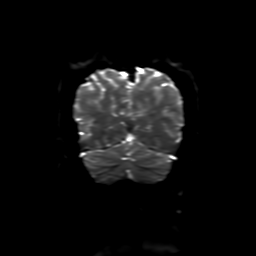
[im 73/73]
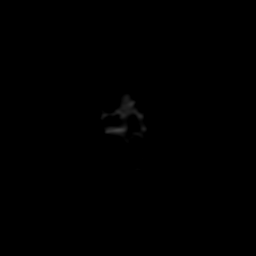

[Series 4: FLAIR · sagittal · 5.0mm · 0.23mm/px · 2 of 25 slices shown (1 of 2)]
[im 1/25]
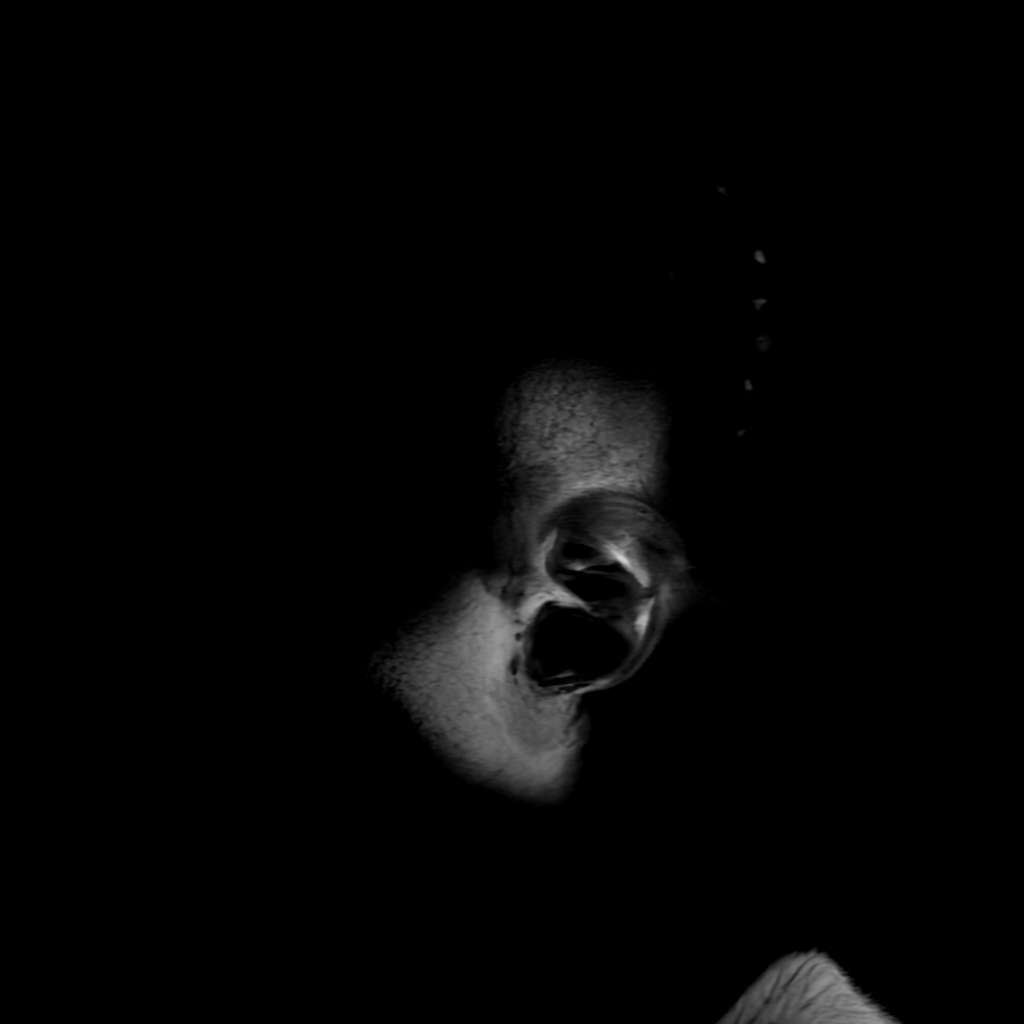
[im 25/25]
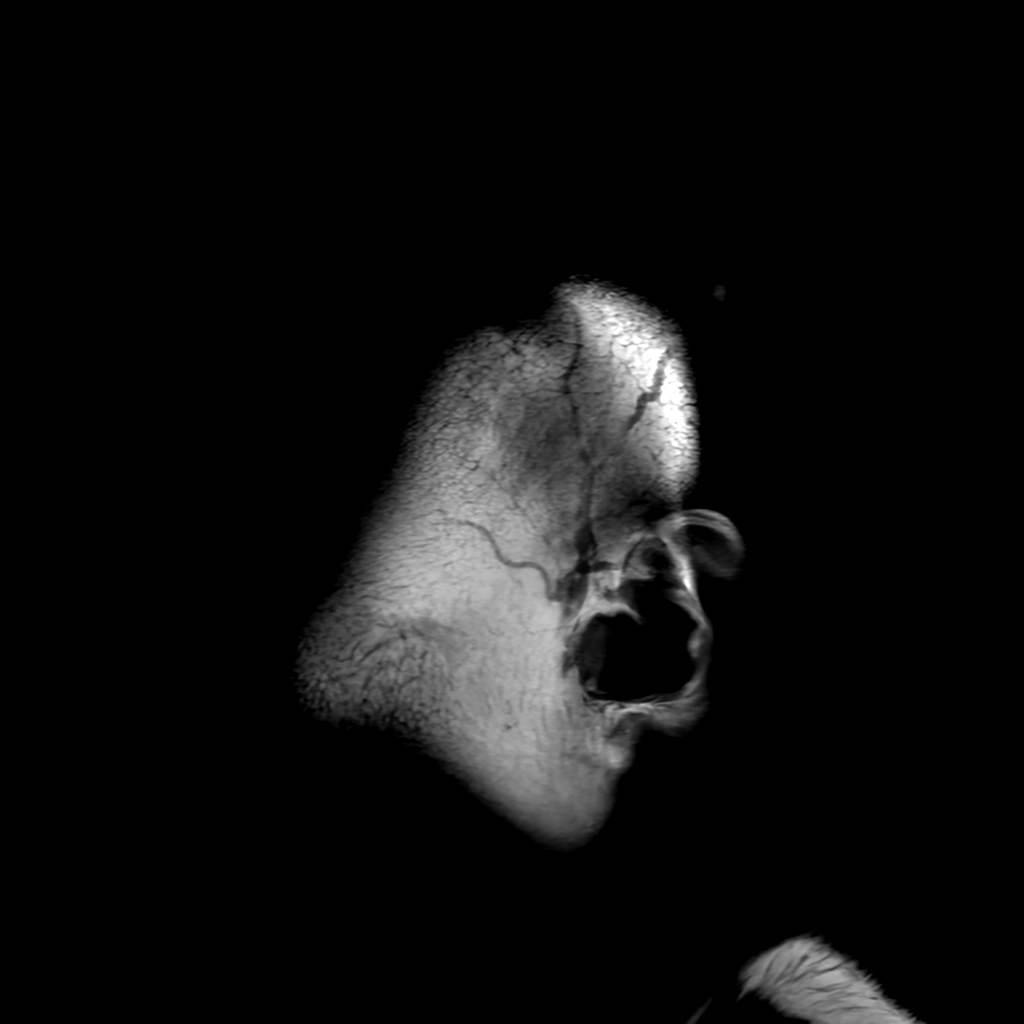

[Series 6: FLAIR · axial · 4.0mm · 0.45mm/px · z∈[-52,+97]mm · 3 of 35 slices shown (2 of 2)]
[im 1/35]
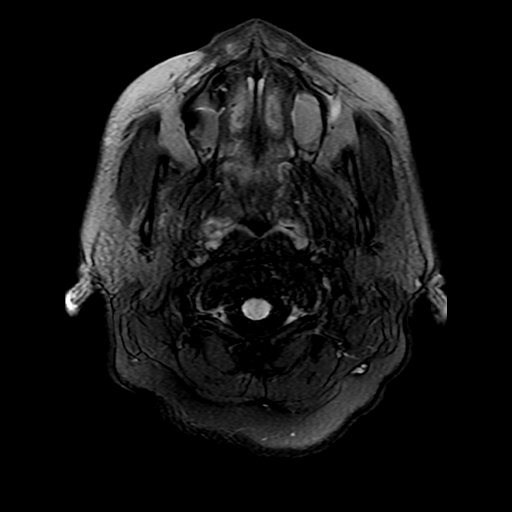
[im 18/35]
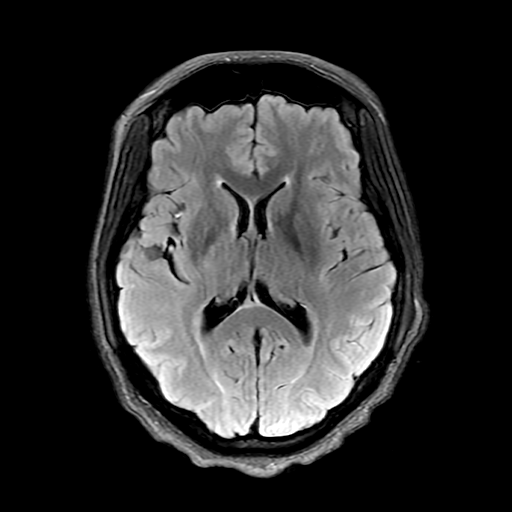
[im 35/35]
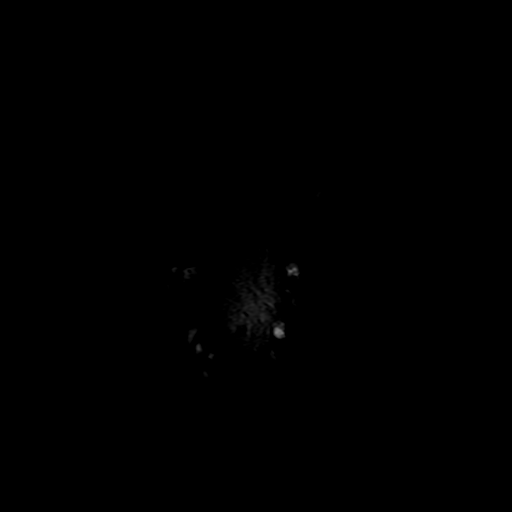

[Series 250: ADC · axial · 3.0mm · 0.94mm/px · z∈[-45,+102]mm · 4 of 50 slices shown (1 of 2)]
[im 1/50]
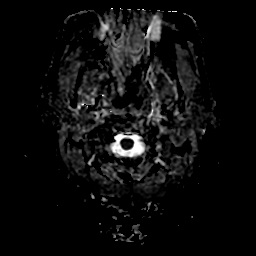
[im 17/50]
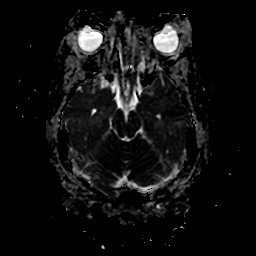
[im 33/50]
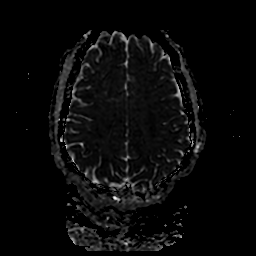
[im 50/50]
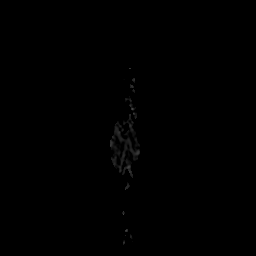

[Series 350: ADC · coronal · 4.0mm · 0.94mm/px · 3 of 37 slices shown (2 of 2)]
[im 1/37]
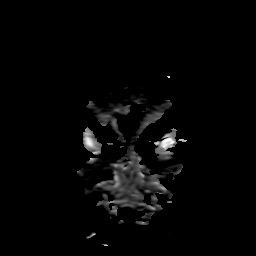
[im 19/37]
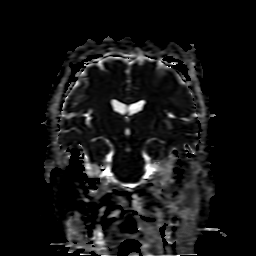
[im 37/37]
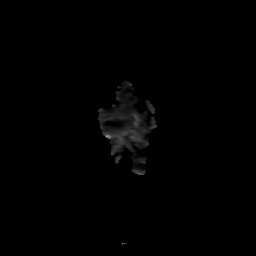

[24 of 48 positions shown; findings below may reference images not displayed]

FINDINGS: Brain: No acute infarction, hemorrhage, hydrocephalus, extra-axial
collection or mass lesion. Remote right MCA territory infarcts
involving the insula and overlying operculum. A couple small
T2/FLAIR hyperintensities the white matter, nonspecific but both
chronic microvascular disease. As before, the cerebellar tonsils
extend 5-6 mm below the level of the foramen magnum

Vascular: Major arterial flow voids are maintained at the skull
base. Further evaluated on same day CTA.

Skull and upper cervical spine: Normal marrow signal.

Sinuses/Orbits: Mild paranasal sinus mucosal thickening with
opacified left posterior ethmoid air cell and bilateral inferior
maxillary sinus retention cysts.

Other: No mastoid effusions.
IMPRESSION: 1. No evidence of acute infarct.
2. Remote right MCA territory infarcts.
3. As before, the cerebellar tonsils extend 5-6 mm below the level
of the foramen magnum with mild crowding at the level of the foramen
magnum. This may reflect a mild Chiari I malformation. As noted on
the prior, cerebellar tonsillar ectopia may be seen in the setting
of idiopathic intracranial hypertension or intracranial hypotension.
There is a partially empty sella turcica, which can also be seen
with intracranial hypertension.

## 2021-03-21 IMAGING — CT CT HEAD CODE STROKE
3 series · 15 of 47 positions shown, 18 images · non-contrast
Comparison: CT [DATE].  MRI [DATE].

CLINICAL DATA: Code stroke.  Neuro deficit, acute, stroke suspected



[Series 2: head 5.0 st · axial · 0.41mm/px · z∈[-131,-6]mm · 9 of 31 slices shown, 12 images]
[im 3/31  brain]
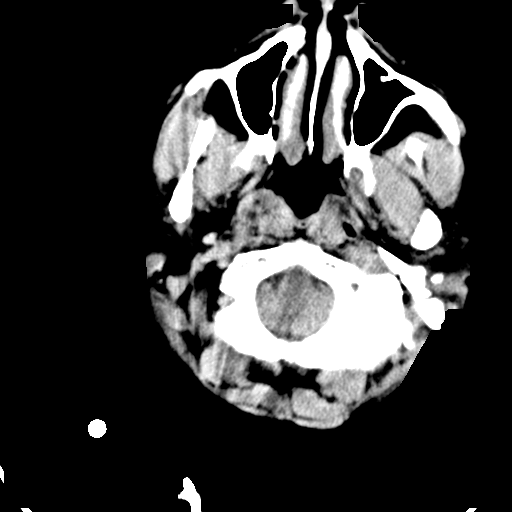
[im 3/31  bone]
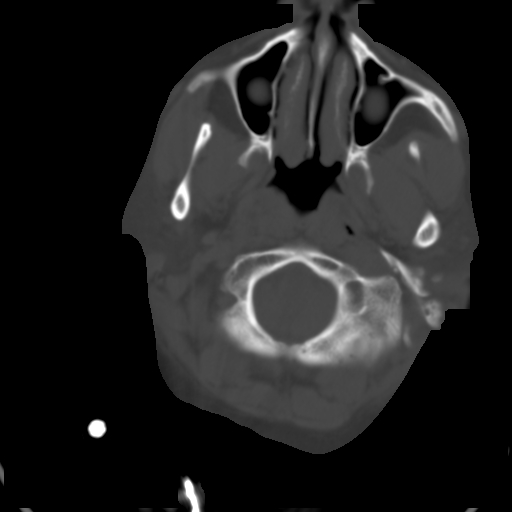
[im 6/31  brain]
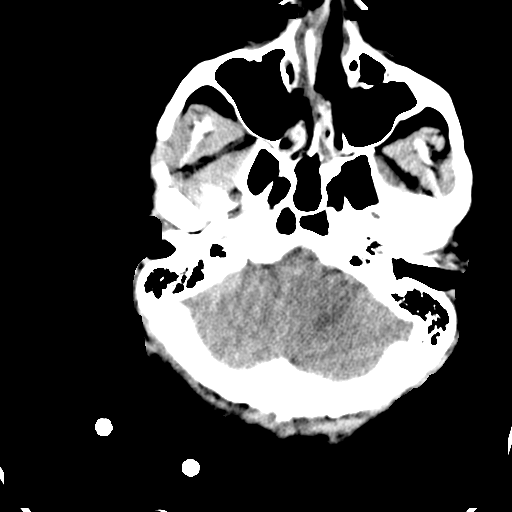
[im 9/31  brain]
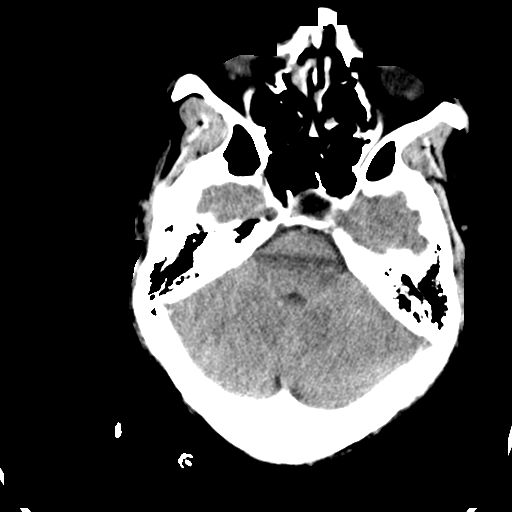
[im 12/31  brain]
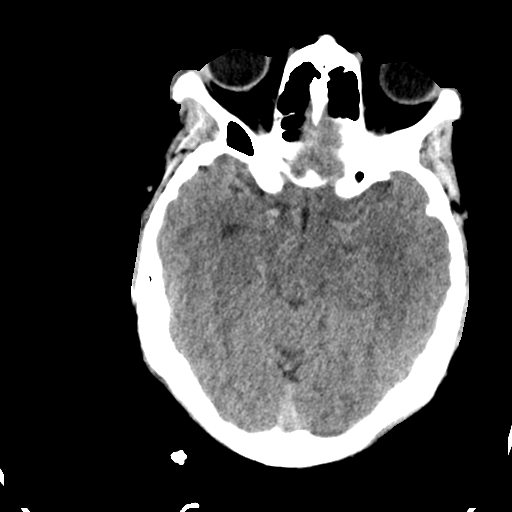
[im 16/31  brain]
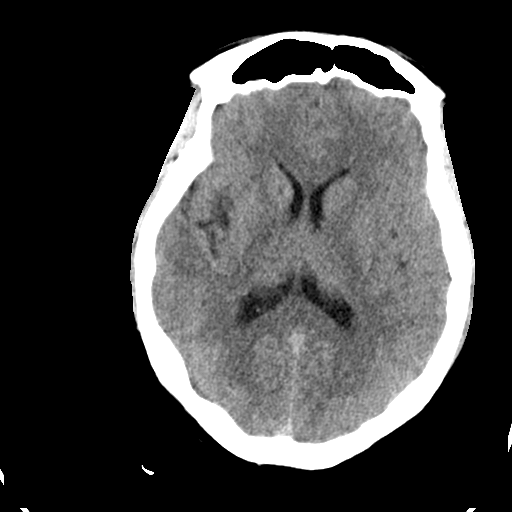
[im 16/31  bone]
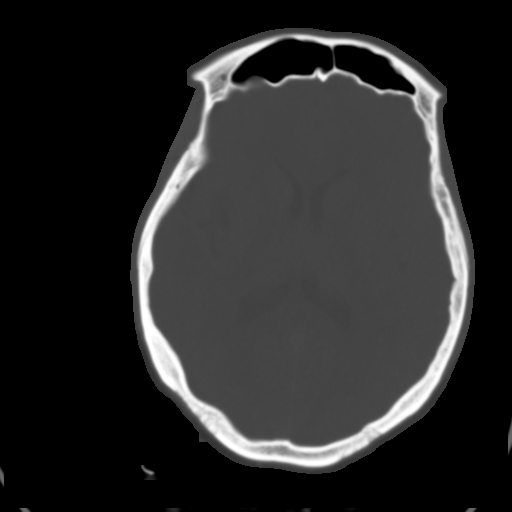
[im 19/31  brain]
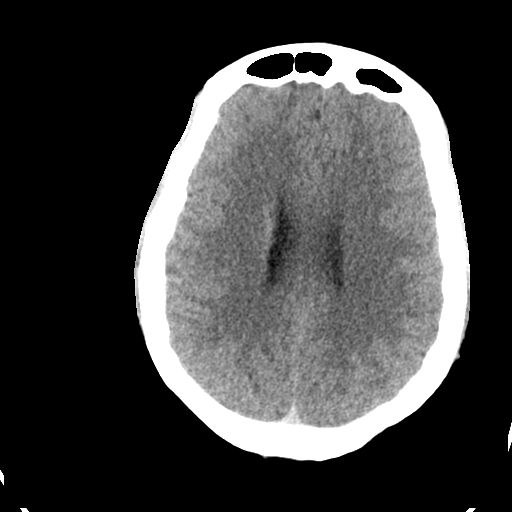
[im 22/31  brain]
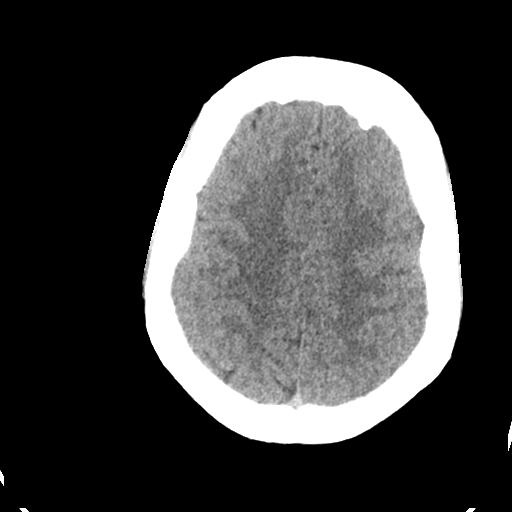
[im 25/31  brain]
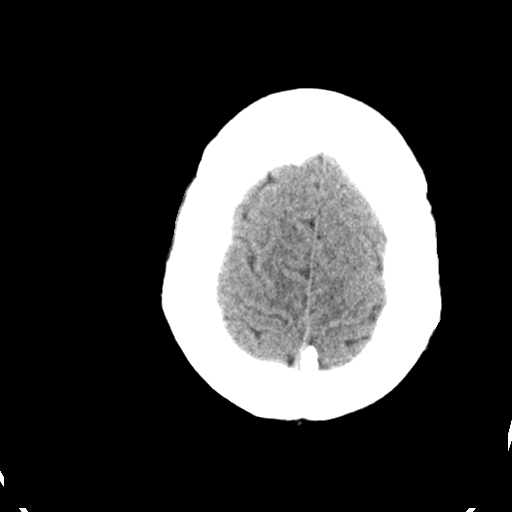
[im 28/31  brain]
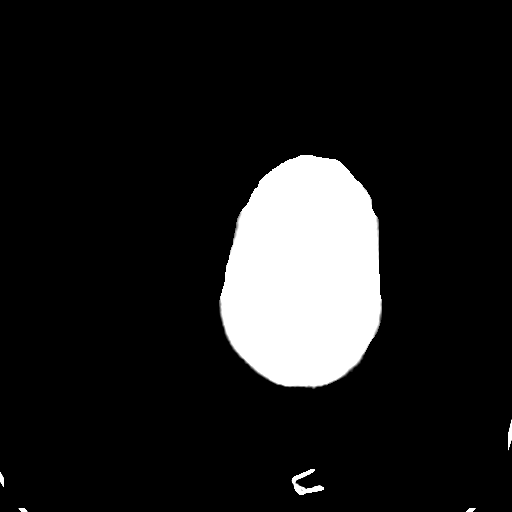
[im 28/31  bone]
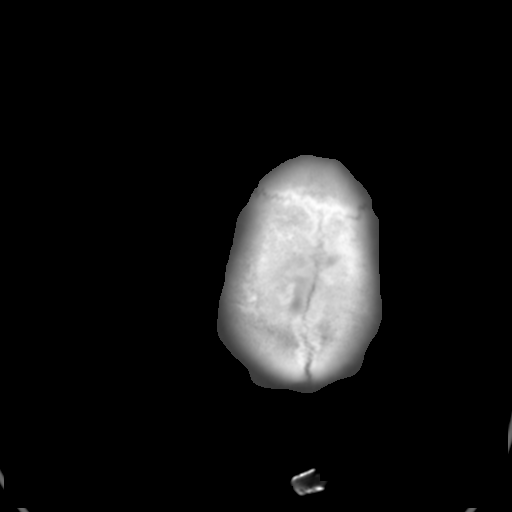

[Series 5: head 3.0 cor st · coronal · 0.30mm/px · 3 of 67 slices shown]
[im 23/67  brain]
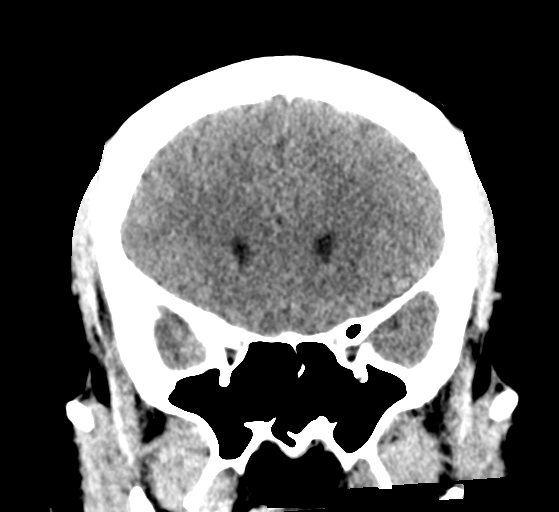
[im 30/67  brain]
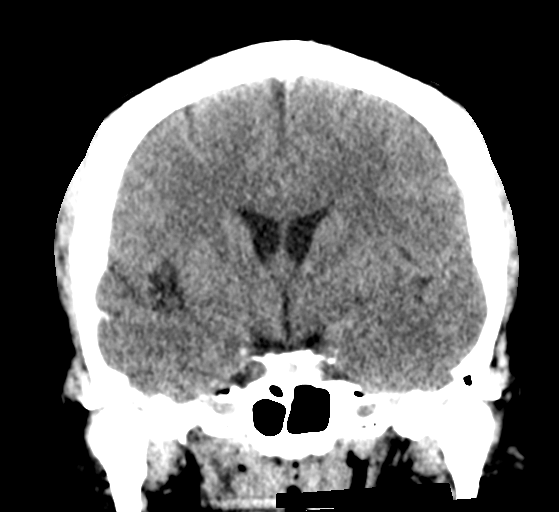
[im 37/67  brain]
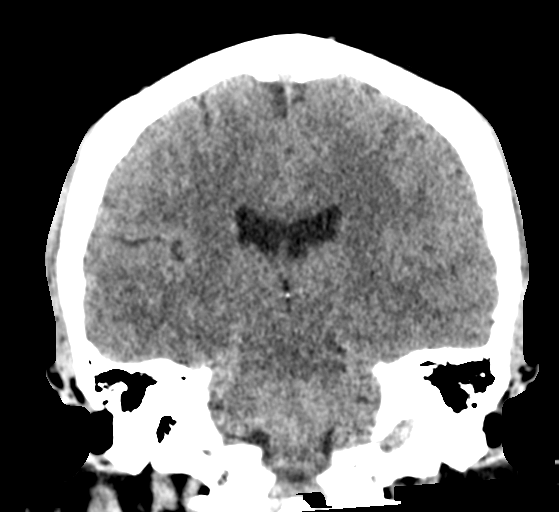

[Series 6: head 3.0 sag st · sagittal · 0.30mm/px · 3 of 54 slices shown]
[im 18/54  brain]
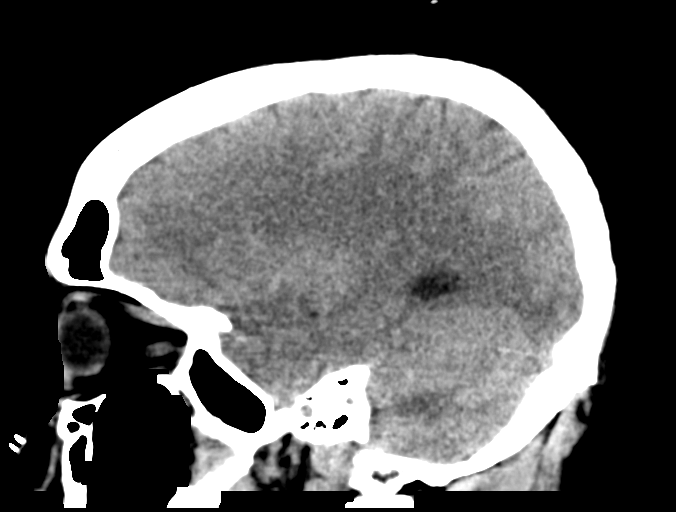
[im 27/54  brain]
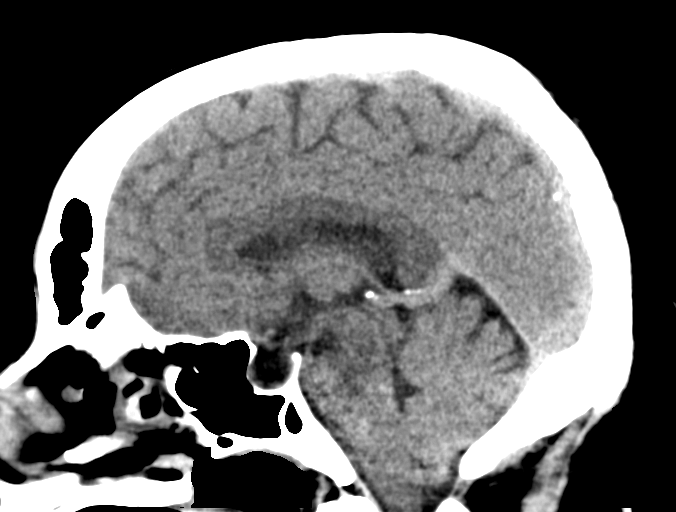
[im 36/54  brain]
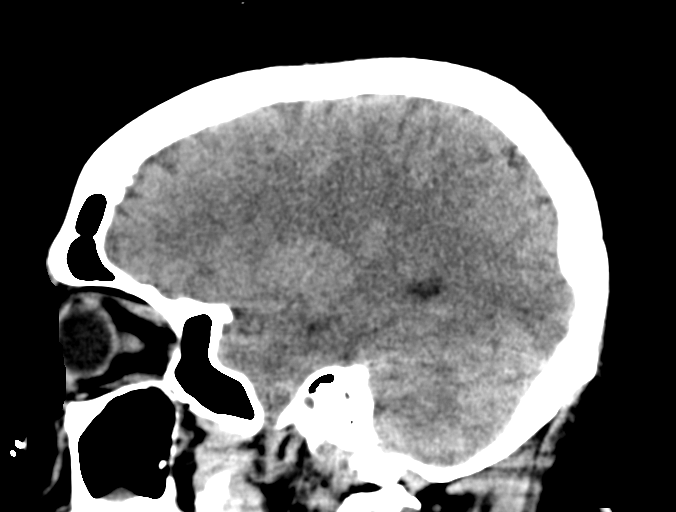

[15 of 47 positions shown; findings below may reference images not displayed]

FINDINGS: Brain: Remote infarcts in the right frontal cortex, insula and
temporal lobe. No evidence of acute large vascular territory
infarct, acute hemorrhage, mass lesion, hydrocephalus, midline
shift. Streak artifact limits evaluation. Partially empty sella.
Similar inferior tonsillar descent.

Vascular: No definite hyperdense vessel identified. Streak artifact
limits evaluation.

Skull: No acute fracture.

Sinuses/Orbits: Opacification of scattered ethmoid air cells and
inferior maxillary sinus retention cysts. No acute orbital findings.

Other: No mastoid effusions.

ASPECTS (Alberta Stroke Program Early CT Score) total score (0-10
with 10 being normal): 10.
IMPRESSION: 1. No evidence of acute large vascular territory infarct or acute
hemorrhage. ASPECTS is 10.
2. Remote right MCA territory infarcts.
3. Similar inferior cerebellar tonsillar descent.

Code stroke imaging results were communicated on [DATE] at [DATE] to provider NATALIANA via secure text paging.

## 2021-03-21 MED ORDER — POLYETHYLENE GLYCOL 3350 17 G PO PACK: 17.0000 g | PACK | Freq: Every day | ORAL | Status: DC | PRN

## 2021-03-21 MED ORDER — INSULIN GLARGINE-YFGN 100 UNIT/ML ~~LOC~~ SOLN
30.0000 [IU] | Freq: Every day | SUBCUTANEOUS | Status: DC
  Administered 2021-03-22 – 2021-03-25 (×4): 30 [IU] via SUBCUTANEOUS
  Filled 2021-03-21 (×4): qty 0.3

## 2021-03-21 MED ORDER — ASPIRIN 325 MG PO TABS
325.0000 mg | ORAL_TABLET | Freq: Once | ORAL | Status: AC
  Administered 2021-03-21: 325 mg via ORAL
  Filled 2021-03-21: qty 1

## 2021-03-21 MED ORDER — HEPARIN (PORCINE) 25000 UT/250ML-% IV SOLN
1250.0000 [IU]/h | INTRAVENOUS | Status: DC
  Administered 2021-03-21: 1000 [IU]/h via INTRAVENOUS
  Administered 2021-03-22: 1350 [IU]/h via INTRAVENOUS
  Administered 2021-03-23: 1250 [IU]/h via INTRAVENOUS
  Filled 2021-03-21 (×4): qty 250

## 2021-03-21 MED ORDER — ACETAMINOPHEN 650 MG RE SUPP: 650.0000 mg | Freq: Four times a day (QID) | RECTAL | Status: DC | PRN

## 2021-03-21 MED ORDER — ALBUTEROL SULFATE (2.5 MG/3ML) 0.083% IN NEBU
3.0000 mL | INHALATION_SOLUTION | Freq: Four times a day (QID) | RESPIRATORY_TRACT | Status: DC | PRN
  Administered 2021-03-22: 3 mL via RESPIRATORY_TRACT
  Filled 2021-03-21: qty 3

## 2021-03-21 MED ORDER — INSULIN ASPART 100 UNIT/ML IJ SOLN
0.0000 [IU] | Freq: Three times a day (TID) | INTRAMUSCULAR | Status: DC
  Administered 2021-03-22 – 2021-03-24 (×2): 1 [IU] via SUBCUTANEOUS

## 2021-03-21 MED ORDER — GADOBUTROL 1 MMOL/ML IV SOLN
10.0000 mL | Freq: Once | INTRAVENOUS | Status: AC | PRN
  Administered 2021-03-21: 10 mL via INTRAVENOUS

## 2021-03-21 MED ORDER — ASPIRIN EC 81 MG PO TBEC
81.0000 mg | DELAYED_RELEASE_TABLET | Freq: Every day | ORAL | Status: DC
  Administered 2021-03-22 – 2021-03-24 (×3): 81 mg via ORAL
  Filled 2021-03-21 (×3): qty 1

## 2021-03-21 MED ORDER — IOHEXOL 350 MG/ML SOLN
60.0000 mL | Freq: Once | INTRAVENOUS | Status: AC | PRN
  Administered 2021-03-21: 60 mL via INTRAVENOUS

## 2021-03-21 MED ORDER — ACETAMINOPHEN 325 MG PO TABS
650.0000 mg | ORAL_TABLET | Freq: Four times a day (QID) | ORAL | Status: DC | PRN
  Administered 2021-03-22 – 2021-03-23 (×2): 650 mg via ORAL
  Filled 2021-03-21 (×2): qty 2

## 2021-03-21 MED ORDER — ROSUVASTATIN CALCIUM 20 MG PO TABS: 40.0000 mg | ORAL_TABLET | Freq: Every day | ORAL | Status: DC

## 2021-03-21 MED ORDER — ONDANSETRON HCL 4 MG/2ML IJ SOLN
4.0000 mg | Freq: Four times a day (QID) | INTRAMUSCULAR | Status: DC | PRN
  Administered 2021-03-22: 4 mg via INTRAVENOUS
  Filled 2021-03-21: qty 2

## 2021-03-21 MED ORDER — ONDANSETRON HCL 4 MG PO TABS: 4.0000 mg | ORAL_TABLET | Freq: Four times a day (QID) | ORAL | Status: DC | PRN

## 2021-03-21 MED ORDER — SODIUM CHLORIDE 0.9% FLUSH
3.0000 mL | Freq: Two times a day (BID) | INTRAVENOUS | Status: DC
  Administered 2021-03-22 – 2021-03-25 (×5): 3 mL via INTRAVENOUS

## 2021-03-21 NOTE — ED Notes (Signed)
Admitting team at bedside. Will return to start heparin

## 2021-03-21 NOTE — Consult Note (Addendum)
Neurology Consultation  Reason for Consult: Code Stroke Referring Physician: Dr. Deretha Emory  CC: Sudden onset right-sided weakness, numbness, and facial droop   History is obtained from: EMS, patient, chart review  HPI: Christina [name] is a 41 y.o. 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 Patient states these symptoms onset suddenly at 16:00 while she was at work and coworkers noted that her face was asymmetric. EMS was subsequently activated. Patient states that she stopped taking her prophylactic aspirin in September of 2022 as it made her feel "cold all over" inside and has not yet had a follow up with her outpatient neurologist to discuss this medication change. On arrival to the ED, patient states that her right lower extremity decreased sensation had improved in addition to the degree of weakness in her leg. She states that her right face does have some residual tingling but that it had improved since onset. She does note that she has had a cough and congestion over the past few days.   LKW: 16:00 TNK given?: no, symptoms too mild and improving. IR Thrombectomy? No, not a candidate.  Modified Rankin Scale: 0-Completely asymptomatic and back to baseline post- stroke  ROS: A complete ROS was performed and is negative except as noted in the HPI.   Past Medical History:  Diagnosis Date   Asthma    Diabetes mellitus without complication (HCC)    Hypertension    Stroke [facility])    Past Surgical History:  Procedure Laterality Date   BUBBLE STUDY  05/29/2020   Procedure: BUBBLE STUDY;  Surgeon: Little Ishikawa, MD;  Location: [facility] ENDOSCOPY;  Service: Cardiovascular;;    IR CT HEAD LTD  05/27/2020   IR PERCUTANEOUS ART THROMBECTOMY/INFUSION INTRACRANIAL INC DIAG ANGIO  05/27/2020   RADIOLOGY WITH ANESTHESIA N/A 05/27/2020   Procedure: IR WITH ANESTHESIA;  Surgeon: Radiologist, Medication, MD;  Location: [facility] OR;  Service: Radiology;  Laterality: N/A;   TEE WITHOUT CARDIOVERSION N/A 05/29/2020   Procedure: TRANSESOPHAGEAL ECHOCARDIOGRAM (TEE);  Surgeon: Little Ishikawa, MD;  Location: [facility] ENDOSCOPY;  Service: Cardiovascular;  Laterality: N/A;   TUBAL LIGATION     No family history on file. Grandmother- 2 strokes  Social History:   reports that she has been smoking cigarettes. She has been smoking an average of .25 packs per day. She has never used smokeless tobacco. She reports current alcohol use. She reports that she does not use drugs.  Medications No current facility-administered medications for this encounter.  Current Outpatient Medications:    albuterol (PROVENTIL HFA;VENTOLIN HFA) 108 (90 BASE) MCG/ACT inhaler, Inhale 2 puffs into the lungs every 6 (six) hours as needed for wheezing., Disp: , Rfl:    aspirin EC 81 MG EC tablet, Take 1 tablet (81 mg total) by mouth daily. Swallow whole., Disp: 30 tablet, Rfl: 11   BREO ELLIPTA 100-25 MCG/INH AEPB, Inhale 1 puff into the lungs daily., Disp: , Rfl:    ergocalciferol (VITAMIN D2) 1.25 MG (50000 UT) capsule, Take 50,000 Units by mouth 2 (two) times a week. Monday and Thursday, Disp: , Rfl:    fexofenadine (ALLEGRA) 180 MG tablet, Take 180 mg by mouth daily., Disp: , Rfl:    hydrochlorothiazide (HYDRODIURIL) 25 MG  tablet, Take 25 mg by mouth daily., Disp: , Rfl:    insulin glargine (LANTUS) 100 UNIT/ML injection, Inject 34 Units into the skin daily as needed (high sugar)., Disp: , Rfl:    metFORMIN (GLUCOPHAGE-XR) 500 MG 24 hr tablet, Take 1,000 mg by mouth daily with breakfast., Disp: , Rfl:    metoprolol tartrate (LOPRESSOR) 100 MG tablet, Take 100 mg by mouth daily., Disp: , Rfl:    ondansetron  (ZOFRAN ODT) 4 MG disintegrating tablet, Take 1 tablet (4 mg total) by mouth every 8 (eight) hours as needed for nausea or vomiting., Disp: 20 tablet, Rfl: 0   OZEMPIC, 1 MG/DOSE, 4 MG/3ML SOPN, Inject 0.75 mLs into the skin every Monday., Disp: , Rfl:    rosuvastatin (CRESTOR) 40 MG tablet, Take 1 tablet (40 mg total) by mouth daily., Disp: , Rfl:   Exam: Current vital signs: Wt 103.8 kg    BMI 35.84 kg/m  Vital signs in last 24 hours: Weight:  [103.8 kg] 103.8 kg (02/16 1500)  GENERAL: Awake, alert, in no acute distress Psych: Affect appropriate for situation, patient is calm and cooperative with examination Head: Normocephalic and atraumatic, without obvious abnormality EENT: Normal conjunctivae, dry mucous membranes, no OP obstruction LUNGS: Normal respiratory effort. Non-labored breathing on room air CV: Regular rate and rhythm on telemetry ABDOMEN: Soft, non-tender, non-distended Extremities: Warm, well perfused, without obvious deformity  NEURO:  Mental Status: Awake, alert, and oriented to person, place, time, and situation. She is able to provide a clear and coherent history of present illness. Speech/Language: speech is intact without dysarthria or aphasia.    No neglect is noted Cranial Nerves:  II: PERRL. Visual fields full.  III, IV, VI: EOMI without ptosis, gaze preference, or nystagmus. V: Decreased sensation to light touch to the right face.  VII: Face appears symmetric resting and smiling. VIII: Hearing is intact to voice IX, X: Palate elevation is symmetric. Phonation normal.  XI: Normal sternocleidomastoid and trapezius muscle strength XII: Tongue protrudes midline without fasciculations.   Motor: 5/5 strength present in the left upper and lower extremities and right upper extremity.  Right lower extremity elevates antigravity with minimal vertical drift on exam.  Tone is normal. Bulk is normal.  Sensation: Decreased sensation to light touch on the right upper  extremity and right face. Sensation to light touch intact and symmetric on bilateral lower extremities  Coordination: FTN intact bilaterally. HKS intact bilaterally. No pronator drift. Alternating hand movements.  Gait: Deferred  NIHSS: 1a Level of Conscious.: 0 1b LOC Questions: 0 1c LOC Commands: 0 2 Best Gaze: 0 3 Visual: 0 4 Facial Palsy: 0 5a Motor Arm - left: 0 5b Motor Arm - Right: 0 6a Motor Leg - Left: 0 6b Motor Leg - Right: 1 7 Limb Ataxia: 0 8 Sensory: 1 9 Best Language: 0 10 Dysarthria: 0 11 Extinct. and Inatten.: 0 TOTAL: 2  Labs I have reviewed labs in epic and the results pertinent to this consultation are: CBC    Component Value Date/Time   WBC 13.5 (H) 05/28/2020 0505   RBC 4.15 05/28/2020 0505   HGB 15.3 (H) 03/21/2021 1723   HCT 45.0 03/21/2021 1723   PLT 314 05/28/2020 0505   MCV 92.5 05/28/2020 0505   MCH 31.1 05/28/2020 0505   MCHC 33.6 05/28/2020 0505   RDW 11.8 05/28/2020 0505   LYMPHSABS 2.3 05/28/2020 0505   MONOABS 1.1 (H) 05/28/2020 0505   EOSABS 0.0 05/28/2020 0505   BASOSABS 0.0 05/28/2020  0505   CMP     Component Value Date/Time   NA 136 03/21/2021 1723   K 3.5 03/21/2021 1723   CL 92 (L) 03/21/2021 1723   CO2 24 05/30/2020 0304   GLUCOSE 154 (H) 03/21/2021 1723   BUN 14 03/21/2021 1723   CREATININE 1.10 (H) 03/21/2021 1723   CALCIUM 8.3 (L) 05/30/2020 0304   PROT 7.0 05/27/2020 1433   ALBUMIN 3.6 05/27/2020 1433   AST 20 05/27/2020 1433   ALT 24 05/27/2020 1433   ALKPHOS 55 05/27/2020 1433   BILITOT 0.6 05/27/2020 1433   GFRNONAA >60 05/30/2020 0304   GFRAA >60 09/16/2019 1625   Lipid Panel     Component Value Date/Time   CHOL 75 05/28/2020 0505   TRIG 170 (H) 05/28/2020 0505   HDL 26 (L) 05/28/2020 0505   CHOLHDL 2.9 05/28/2020 0505   VLDL 34 05/28/2020 0505   LDLCALC 15 05/28/2020 0505   Lab Results  Component Value Date   HGBA1C 9.7 (H) 05/28/2020   Imaging I have reviewed the images obtained:  CT-scan  of the brain 2/16: 1. No evidence of acute large vascular territory infarct or acute hemorrhage. ASPECTS is 10. 2. Remote right MCA territory infarcts. 3. Similar inferior cerebellar tonsillar descent.  CT angio head and neck 2/16: 1. New filling defect in the proximal right ICA in the neck, concerning for intraluminal clot and possible plaque rupture. 2. No large vessel occlusion or proximal hemodynamically significant stenosis in the head or neck.  MRI examination of the brain 2/16: 1. No evidence of acute infarct. 2. Remote right MCA territory infarcts. 3. As before, the cerebellar tonsils extend 5-6 mm below the level of the foramen magnum with mild crowding at the level of the foramen magnum. 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 As noted on the prior, cerebellar tonsillar ectopia may be seen in the setting of idiopathic intracranial hypertension or intracranial hypotension. There is a partially empty sella turcica, which can also be seen with intracranial hypertension.  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 female with history of CVA s/p TPA and thrombectomy for a right M1 occlusion without residual deficit and off of prophylactic therapy since September 2022 who presented to the ED 2/16 via EMS from work with sudden onset of right face droop, right hemiparesis, and right-sided sensory deficits. - Examination reveals patient with improved right lower extremity weakness and sensation on presentation without obvious facial asymmetry. She does have minimal right lower extremity drift on assessment in addition to decreased sensation to light touch on the right face and right upper extremity. Her NIHSS is a 2.  - CTH imaging is without acute intracranial abnormality. CTA imaging revealed a filling defect in the proximal right ICA concerning for intraluminal clot and possible plaque rupture. MRI brain is  without acute infarction.  - Presentation is concerning for possible TIA.  Recommendations: - Frequent neuro checks q 11mins until out of TNK window  - Permissive hypertension  - HgbA1c, fasting lipid panel - Echocardiogram  -Heparin ggt. -q2 neuro checks in progressive unit - Risk factor modification - Telemetry monitoring - PT consult, OT consult, Speech consult - Stroke team to follow  Pt seen by NP/Neuro and later by MD. Note/plan to be edited by MD as needed.  Anibal Henderson, AGAC-NP [facility] Pager: [phone]  ATTENDING ATTESTATION:  This is a 41 year old lady with history of  right M1 occlusion status post tPA and thrombectomy in April of last year.  She had a full recovery of her symptoms.  She was supposed to be on aspirin daily but stopped taking this in September because it made her feel "hot".  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  Her sneezes comes multiple times in a cluster and she usually jerked her head and neck forward each time.  She noticed around the time her right facial droop and right sided tingling she also had right neck pain from her sneezing episodes.  There is a questionable filling defect in the right carotid on CTA on discussion with the radiologist.  Decision was made to do a stat MRI in addition to MRA of the neck with and without contrast.  MRI brain was negative restricted diffusion.  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  This does not correlate to her symptoms of right facial droop or right-sided numbness And is likely incidental finding.  The decision was made to start a heparin drip with no bolus and every 2 neurochecks to ensure there is no distal propagation of the thrombus.   I discussed this with the patient she is aware of the risk of heparin.  On repeat examination after the MRI her symptoms had resolved and she reported no further tingling or facial droop.  She will be followed up by the stroke team  Discussed with ED physician to admit progressive care unit every 2 neurochecks and the decision to start heparin.  Pharmacy was consulted.  I also discussed her case with the overnight neurologist who will continue to monitor her.  Dr. Reeves Forth evaluated pt independently, reviewed imaging, chart, labs. Discussed and formulated plan with the APP. Please see APP note above for details.      This patient is critically ill due to  Code stroke, carotid thrombus on heparring ggt and at significant risk of neurological worsening, death form heart failure, respiratory failure, recurrent stroke, bleeding from [facility], seizure, sepsis. This patient's care requires constant monitoring of vital signs, hemodynamics, respiratory and cardiac monitoring, review of multiple databases, neurological assessment, discussion with family, other specialists and medical decision making of high complexity. I spent 80 minutes of neurocritical care time in the care of this patient.   Maretta Overdorf,MD

## 2021-03-21 NOTE — ED Notes (Signed)
Returned from MRI. Stroke team and neurologist at bedside.

## 2021-03-21 NOTE — Progress Notes (Signed)
ANTICOAGULATION CONSULT NOTE - Initial Consult  Pharmacy Consult for IV heparin Indication:  ICA dissection  Allergies  Allergen Reactions   Peanut-Containing Drug Products Anaphylaxis   Lisinopril Other (See Comments)    Lip swelling   Penicillins Rash   Tomato Rash    Fresh tomatoes     Patient Measurements: Weight: 103.8 kg (228 lb 13.4 oz) Heparin Dosing Weight: 85 kg  Vital Signs: Temp: 98.2 F (36.8 C) (02/16 1900) Temp Source: Oral (02/16 1900) BP: 110/72 (02/16 1900) Pulse Rate: 83 (02/16 1900)  Labs: Recent Labs    03/21/21 1718 03/21/21 1723  HGB 14.2 15.3*  HCT 43.7 45.0  PLT 303  --   APTT 29  --   LABPROT 14.6  --   INR 1.1  --   CREATININE 1.03* 1.10*    CrCl cannot be calculated (Unknown ideal weight.).   Medical History: Past Medical History:  Diagnosis Date   Asthma    Diabetes mellitus without complication (HCC)    Hypertension    Stroke [facility])     Assessment: 41 YO female admitted for code stroke. 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 TNKase was NOT administered. CBC stable, H/H 15.3/45.   Goal of Therapy:  Heparin level 0.3-0.5 units/ml Monitor platelets by anticoagulation protocol: Yes   Plan:  Start heparin gtt at 1000 units/hr, no bolus F/up 6h heparin level Monitor CBC, HL, and s/sx of bleeding daily   Valeda Malm, Pharm.D. PGY-1 Pharmacy Resident [phone] 03/21/2021 7:35 PM

## 2021-03-21 NOTE — H&P (Signed)
Date: 03/21/2021               Patient Name:  Christina [name] MRN: [id number]  DOB: [birth year] Age / Sex: 41 y.o., female   PCP: Debroah Loop, PA-C         Medical Service: Internal Medicine Teaching Service         Attending Physician: Dr. Velna Ochs, MD    First Contact: Dr. Jeanice Lim Pager: [phone]  Second Contact: Dr. Johnney Ou Pager: [phone]       After Hours (After 5p/  First Contact Pager: [phone]  weekends / holidays): Second Contact Pager: [phone]   Chief Complaint: Right-sided weakness, numbness, and facial droop  History of Present Illness: Christina [name] is a 41 y.o. female with a past medical history of asthma, T2DM, HTN, and CVA in April 2022 s/p tPA and thrombectomy who presents to [facility] today with a chief complaint of right-sided weakness, numbness, and facial droop.  The patient states that, on Tuesday, she started feeling sick (sneezing and coughing).  She also noticed that her neck started to feel sore.  She works as a Scientist, water quality at [employer], and when she was at work today (around 4 PM), she was checking a customer out and had difficulty with adding and subtracting the customer's change.  At that time, she started having a runny nose and was not able to feel the right side of her face.  Her coworker told her that her face also started to look droopy and that she should go to the hospital.  The patient continues to have decreased sensation over her right arm and right leg.  She also continues to feel congested and has a sore throat but denies any chest pain and shortness of breath.  Also endorses dry cough and some clear eye discharge.  She initially had some nausea but this has resolved now.  She denies any other symptoms including wobbly gait, vision changes, hearing loss.   In regards to her stroke from last April, she states that the infarct was right-sided, therefore she had left-sided weakness and numbness at that time.  However, she states that  this has completely resolved.  She was on aspirin for a while, however she stopped taking this in September due to "feeling cold."  She also states that she was supposed to have a sleep study done but has not yet been able to schedule this.  There are no other complaints or concerns today.  Meds:    albuterol (PROVENTIL HFA;VENTOLIN HFA) 108 (90 BASE) MCG/ACT    aspirin EC 81 MG EC tablet, Take 1 tablet (81 mg total) by mouth daily   BREO ELLIPTA 100-25 MCG/INH AEPB, Inhale 1 puff into the lungs daily., Disp: , Rfl:    ergocalciferol (VITAMIN D2)    insulin glargine (LANTUS) 100 UNIT/ML injection, Inject 34 Units into the skin daily as needed (high sugar).   metFORMIN (GLUCOPHAGE-XR) 500 MG 24 hr tablet, Take 1,000 mg by mouth daily    metoprolol tartrate (LOPRESSOR) 100 MG tablet, Take 100 mg by mouth daily.   OZEMPIC, 1 MG/DOSE, 4 MG/3ML SOPN, Inject 0.75 mLs into the skin every Monday.,    rosuvastatin (CRESTOR) 40 MG tablet, Take 1 tablet (40 mg total) by mouth daily., Disp: , Rfl:     Allergies: Allergies as of 03/21/2021 - Review Complete 03/21/2021  Allergen Reaction Noted   Peanut-containing drug products Anaphylaxis 09/12/2013   Lisinopril Other (See Comments) 04/17/2016   Penicillins  Rash 05/30/2014   Tomato Rash 09/12/2013   Past Medical History:  Diagnosis Date   Asthma    Diabetes mellitus without complication ([place])    Hypertension    Stroke [facility])     Family History: Mother and maternal grandmother had diabetes.  Her mother died of a heart attack.  Mother and father had CKD  Social History: The patient is from [place] originally.  She works as a Scientist, water quality at [employer].  She states that she does not get much physical exercise outside of walking to and from work.  Occasional alcohol use.  Used to smoke tobacco, however she quit last April after she had her stroke.  Review of Systems: A complete ROS was negative except as per HPI.   Physical Exam: Blood pressure  120/84, pulse 89, temperature 98.2 F (36.8 C), temperature source Oral, resp. rate 14, height 5\' 7"  (1.702 m), weight 103.8 kg, SpO2 96 %. General: NAD, nl appearance HE: Normocephalic, atraumatic, EOMI, Conjunctivae normal ENT: No congestion, no exudate or erythema  Cardiovascular: Normal rate, regular rhythm. No murmurs, rubs, or gallops Pulmonary: Effort normal, breath sounds normal. No wheezes, rales, or rhonchi Abdominal: soft, nontender, nondistended Musculoskeletal: no swelling, deformity, injury or tenderness in extremities Skin: Warm, dry, no bruising, erythema, or rash Psychiatric/Behavioral: normal mood, normal behavior   Neurologic exam: Mental status: Alert and oriented to place and situation Cranial Nerves:             II: PERRL             III, IV, VI: Extra-occular motions intact bilaterally             V, VII: Face symmetric, sensation is intact in V1 and V3 distributions, decreased in V2 distribution on the right             VIII: hearing is grossly intact             IX, X: palate rises symmetrically             XI: Normal shoulder shrug bilaterally               XII: Tongue midline    Motor: Strength 5/5 on all upper and lower extremities, bulk muscle and tone are normal Sensory: Decreased sensation in RUE, intact in LUE.  Sensation is intact in BLE. Coordination: There is no dysmetria on finger-to-nose.  Psychiatric: Normal mood and affect  EKG: personally reviewed my interpretation is normal sinus rhythm  CT Head Code stroke: IMPRESSION: 1. No evidence of acute large vascular territory infarct or acute hemorrhage. ASPECTS is 10. 2. Remote right MCA territory infarcts. 3. Similar inferior cerebellar tonsillar descent.  CT Angio Head/Neck W WO IMPRESSION: 1. New filling defect in the proximal right ICA in the neck, concerning for intraluminal clot and possible plaque rupture. 2. No large vessel occlusion or proximal hemodynamically significant stenosis in  the head or neck.  MRI Brain WO Contrast IMPRESSION: 1. No evidence of acute infarct. 2. Remote right MCA territory infarcts. 3. As before, the cerebellar tonsils extend 5-6 mm below the level of the foramen magnum with mild crowding at the level of the foramen magnum. 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 As noted on the prior, cerebellar tonsillar ectopia may be seen in the setting of idiopathic intracranial hypertension or intracranial hypotension. There is a partially empty sella turcica, which can also be seen with intracranial hypertension.  MR Angio Neck W WO Contrast IMPRESSION: Filling  defect in the proximal right ICA causing mild luminal stenosis. This confirms the finding on CT angiogram. This could be due to underlying plaque rupture or dissection.   Assessment & Plan by Problem: Principal Problem:   CVA (cerebral vascular accident) ([place])  Christina [name] is a 41 y.o. female with a past medical history of asthma, T2DM, HTN, and CVA in April 2022 s/p tPA and thrombectomy who presents today with sudden onset right facial droop, right hemiparesis, and right-sided sensory deficits concerning for possible TIA versus CVA.   TIA vs CVA This is a 41 year old with a past medical history of right M1 occlusion status post tPA and thrombectomy in April 2022 who presents here with concern for TIA in the setting of missed aspirin doses since September of last year.  On arrival to the ED, patient was afebrile and hemodynamically stable.  Her labs are overall unremarkable with the exception of an elevated glucose to 154. NIHSS 2.  CT head is unremarkable, CTA showed filling defect in the proximal right ICA.  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  Outside of tPA window.  The  stroke team is on board with recommendations as below.  - Stroke team is on board, appreciate their recommendations - Aspirin 325 mg x 1, followed by 81 mg daily - Rosuvastatin 40 mg p.o. daily - Permissive hypertension for 24-48 hours - A1C, lipid panel pending - Echo pending - Heparin drip per neuro - Frequent neurochecks - Telemetry - PT/OT consults  T2DM Most recent A1c in our system is 6.0 from August 2022.  The patient is on metformin 1000 mg daily, weekly Ozempic, and daily Lantus at home. - Semglee 30 units daily - CBG monitoring - SSI  Asthma Asthma is well controlled on albuterol inhaler as needed and Breo Ellipta at home. - Albuterol inhaler PRN  Dispo: Admit patient to Observation with expected length of stay less than 2 midnights.  Signed: Orvis Brill, MD 03/21/2021, 8:48 PM  Pager: [phone] After 5pm on weekdays and 1pm on weekends: On Call pager: [phone]

## 2021-03-21 NOTE — ED Notes (Signed)
Neurology at bedside.

## 2021-03-21 NOTE — ED Notes (Signed)
ED Provider at bedside. 

## 2021-03-21 NOTE — Code Documentation (Addendum)
Stroke Response Nurse Documentation Code Documentation  Christina [name] is a 41 y.o. female arriving to [facility] ED via [facility] on 03/21/20 with past medical hx of DM, HTN, Stroke 2022, previous smoker. On No antithrombotic. Code stroke was activated by EMS.   Patient from work where she was LKW at 1600. She stepped into the breakroom and her coworkers noticed a right facial droop and she then started feeling right sided numbness.  By the time EMS arrived, her sensation was improving slightly though facial droop was still present.  Stroke team at the bedside on patient arrival. Labs drawn and patient cleared for CT by Dr. Vernell Barrier. Patient to CT with team. NIHSS 2, see documentation for details and code stroke times. Patient with right leg weakness and right decreased sensation on exam.   The following imaging was completed: CT, CTA head and neck. Patient is not a candidate for IV Thrombolytic due to too mild to treat.   Care/Plan: q23min vitals and q30 min NIHSS until outside TNK window (2030); then q2h x12.  Permissive hypertension.  Bedside handoff with ED RN Katharine Look.    Candace Cruise K  Stroke Response RN

## 2021-03-21 NOTE — ED Triage Notes (Signed)
LKW 1600, at work noted loss of coordination, right facialoop and numbness right side face/arm and thigh. Hx recent stroke.

## 2021-03-21 NOTE — Hospital Course (Addendum)
Past Medical History:  Diagnosis Date   Asthma    Diabetes mellitus without complication (HCC)    Hypertension    Stroke [facility])    Presented with: code stroke - TIA NIH 2 Right sided numbness and facial droop   Previous stroke  CT and CTA done proximal ICA occulusion MRI negative       albuterol (PROVENTIL HFA;VENTOLIN HFA) 108 (90 BASE) MCG/ACT    aspirin EC 81 MG EC tablet, Take 1 tablet (81 mg total) by mouth daily   BREO ELLIPTA 100-25 MCG/INH AEPB, Inhale 1 puff into the lungs daily., Disp: , Rfl:    ergocalciferol (VITAMIN D2)    hydrochlorothiazide (HYDRODIURIL) 25 MG tablet, Take 25 mg by mouth daily.,    insulin glargine (LANTUS) 100 UNIT/ML injection, Inject 34 Units into the skin daily as needed (high sugar).   metFORMIN (GLUCOPHAGE-XR) 500 MG 24 hr tablet, Take 1,000 mg by mouth daily    metoprolol tartrate (LOPRESSOR) 100 MG tablet, Take 100 mg by mouth daily.   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    Had a previous stroke in April and was on asprin but stopped due to increase menstrual bleeding and feeling cold Gave TPA   Mom and granda had diabetes Mom died of heart attack  Mom and dad had CKD and mom had HD

## 2021-03-21 NOTE — ED Provider Notes (Addendum)
[place] EMERGENCY DEPARTMENT Provider Note   CSN: [id number] Arrival date & time: 03/21/21  1709  An emergency department physician performed an initial assessment on this suspected stroke patient at 1710.  History  Chief Complaint  Patient presents with   Code Stroke    Christina [name] is a 41 y.o. female.  Patient arrived as a code stroke patient seen by the stroke team.  Last known normal was at 1600.  Patient with sudden onset of right-sided weakness numbness and facial droop.  Patient's NIH score was 2.  Got a 1 4 motor leg right weakness and got a 1 for sensory.  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        Home Medications Prior to Admission medications   Medication Sig Start Date End Date Taking? Authorizing Provider  albuterol (PROVENTIL HFA;VENTOLIN HFA) 108 (90 BASE) MCG/ACT inhaler Inhale 2 puffs into the lungs every 6 (six) hours as needed for wheezing.    [provider]  aspirin EC 81 MG EC tablet Take 1 tablet (81 mg total) by mouth daily. Swallow whole. 05/30/20   Bailey-Modzik, Delila A, NP  BREO ELLIPTA 100-25 MCG/INH AEPB Inhale 1 puff into the lungs daily. 08/10/19   [provider]  ergocalciferol (VITAMIN D2) 1.25 MG (50000 UT) capsule Take 50,000 Units by mouth 2 (two) times a week. Monday and Thursday    [provider]  fexofenadine (ALLEGRA) 180 MG tablet Take 180 mg by mouth daily.    [provider]  hydrochlorothiazide (HYDRODIURIL) 25 MG tablet Take 25 mg by mouth daily. 03/28/20   [provider]  insulin glargine (LANTUS) 100 UNIT/ML injection Inject 34 Units into the skin daily as needed (high sugar).    [provider]  metFORMIN (GLUCOPHAGE-XR) 500 MG 24 hr tablet Take 1,000 mg by mouth daily with breakfast.    [provider]  metoprolol tartrate (LOPRESSOR) 100 MG tablet Take 100 mg by mouth daily. 08/11/19   [provider]  ondansetron (ZOFRAN ODT) 4 MG disintegrating tablet Take 1 tablet (4 mg total) by mouth every 8 (eight) hours as needed for nausea or vomiting. 09/16/19   Gareth Morgan, MD  OZEMPIC, 1 MG/DOSE, 4 MG/3ML SOPN Inject 0.75 mLs into the skin every Monday. 05/18/20   [provider]  rosuvastatin (CRESTOR) 40 MG tablet Take 1 tablet (40 mg total) by mouth daily. 05/30/20   Bailey-Modzik, Delila A, NP  fluticasone (FLONASE) 50 MCG/ACT nasal spray Place 1 spray into both nostrils daily. 08/02/17 12/02/18  Volanda Napoleon, PA-C      Allergies    Peanut-containing drug products, Lisinopril, Penicillins, and Tomato    Review of Systems   Review of Systems  Constitutional:  Negative for chills and fever.  HENT:  Negative for ear pain and sore throat.   Eyes:  Negative for pain and visual disturbance.  Respiratory:  Negative for cough and shortness of breath.   Cardiovascular:  Negative for chest pain and palpitations.  Gastrointestinal:  Negative for abdominal pain and vomiting.  Genitourinary:  Negative for dysuria and hematuria.  Musculoskeletal:  Negative for arthralgias and back pain.  Skin:  Negative for color change and rash.  Neurological:  Positive for facial asymmetry, weakness and numbness. Negative for seizures and syncope.  All other systems reviewed and are negative.  Physical Exam Updated Vital Signs BP 110/72 (BP Location: Right Arm)    Pulse 83    Temp 98.2 F (36.8 C) (Oral)    Resp 18    Wt 103.8 kg    SpO2 96%    BMI 35.84 kg/m  Physical Exam Vitals and nursing note reviewed.  Constitutional:      General: She is not in acute distress.    Appearance: Normal appearance. She is well-developed.  HENT:     Head: Normocephalic and atraumatic.  Eyes:     Conjunctiva/sclera: Conjunctivae normal.     Pupils: Pupils are equal, round, and reactive to light.   Cardiovascular:     Rate and Rhythm: Normal rate and regular rhythm.     Heart sounds: No murmur heard. Pulmonary:     Effort: Pulmonary effort is normal. No respiratory distress.     Breath sounds: Normal breath sounds.  Abdominal:     Palpations: Abdomen is soft.     Tenderness: There is no abdominal tenderness.  Musculoskeletal:        General: No swelling.     Cervical back: Normal range of motion and neck supple.  Skin:    General: Skin is warm and dry.     Capillary Refill: Capillary refill takes less than 2 seconds.  Neurological:     Mental Status: She is alert.     Cranial Nerves: Cranial nerve deficit present.     Sensory: Sensory deficit present.     Motor: Weakness present.     Comments: Patient with some right facial droop some right sided numbness and some right-sided weakness.  Upper extremity and lower extremity.  Psychiatric:        Mood and Affect: Mood normal.    ED Results / Procedures / Treatments   Labs (all labs ordered are listed, but only abnormal results are displayed) Labs Reviewed  CBC - Abnormal; Notable for the following components:      Result Value   WBC 3.8 (*)    All other components within normal limits  DIFFERENTIAL - Abnormal; Notable for the following components:   Neutro Abs 1.5 (*)    All other components within normal limits  COMPREHENSIVE METABOLIC PANEL - Abnormal; Notable for the following components:   Sodium 134 (*)    Chloride 94 (*)    Glucose, Bld 157 (*)    Creatinine, Ser 1.03 (*)    All other components within normal limits  CBG MONITORING, ED - Abnormal; Notable for the following components:   Glucose-Capillary 151 (*)    All other components within normal limits  I-STAT CHEM 8, ED - Abnormal; Notable for the following components:   Chloride 92 (*)    Creatinine, Ser 1.10 (*)    Glucose, Bld 154 (*)    Calcium, Ion 1.07 (*)    Hemoglobin 15.3 (*)    All other components within normal limits  RESP PANEL BY  RT-PCR (FLU A&B, COVID) ARPGX2  ETHANOL  PROTIME-INR  APTT  RAPID URINE DRUG SCREEN, HOSP PERFORMED  URINALYSIS, ROUTINE W REFLEX MICROSCOPIC  I-STAT BETA HCG BLOOD, ED ([facility], [facility], [facility] ONLY)    EKG None  Radiology MR ANGIO NECK W WO CONTRAST  Result Date: 03/21/2021 CLINICAL DATA:  Abnormal CTA with filling defect right ICA EXAM: MRA NECK WITHOUT AND WITH CONTRAST TECHNIQUE: Multiplanar and multiecho pulse sequences of the neck were obtained without and with intravenous contrast. Angiographic images of the neck were obtained using MRA  technique without and with intravenous contrast. CONTRAST:  24mL GADAVIST GADOBUTROL 1 MMOL/ML IV SOLN COMPARISON:  CTA head and neck 03/21/2021 FINDINGS: MRI degraded by motion. MRA confirms a filling defect in the proximal right ICA causing mild narrowing of the lumen. Remainder of the right internal carotid artery normal Left carotid bifurcation normal without stenosis or irregularity Both vertebral arteries normal and widely patent. Antegrade flow in the carotid and vertebral arteries bilaterally. IMPRESSION: Filling defect in the proximal right ICA causing mild luminal stenosis. This confirms the finding on CT angiogram. This could be due to underlying plaque rupture or dissection. These results were called by telephone at the time of interpretation on 03/21/2021 at 6:55 pm to provider Palikh, who verbally acknowledged these results. Electronically Signed   By: Franchot Gallo M.D.   On: 03/21/2021 18:56   MR BRAIN WO CONTRAST  Result Date: 03/21/2021 CLINICAL DATA:  Neuro deficit, acute, stroke suspected EXAM: MRI HEAD WITHOUT CONTRAST TECHNIQUE: Multiplanar, multiecho pulse sequences of the brain and surrounding structures were obtained without intravenous contrast. COMPARISON:  MRI May 28, 2020. FINDINGS: Brain: No acute infarction, hemorrhage, hydrocephalus, extra-axial collection or mass lesion. Remote right MCA territory infarcts involving the insula and  overlying operculum. A couple small T2/FLAIR hyperintensities the white matter, nonspecific but both chronic microvascular disease. As before, the cerebellar tonsils extend 5-6 mm below the level of the foramen magnum Vascular: Major arterial flow voids are maintained at the skull base. Further evaluated on same day CTA. Skull and upper cervical spine: Normal marrow signal. Sinuses/Orbits: Mild paranasal sinus mucosal thickening with opacified left posterior ethmoid air cell and bilateral inferior maxillary sinus retention cysts. Other: No mastoid effusions. IMPRESSION: 1. No evidence of acute infarct. 2. Remote right MCA territory infarcts. 3. As before, the cerebellar tonsils extend 5-6 mm below the level of the foramen magnum with mild crowding at the level of the foramen magnum. 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 As noted on the prior, cerebellar tonsillar ectopia may be seen in the setting of idiopathic intracranial hypertension or intracranial hypotension. There is a partially empty sella turcica, which can also be seen with intracranial hypertension. Electronically Signed   By: Margaretha Sheffield M.D.   On: 03/21/2021 18:12   CT HEAD CODE STROKE WO CONTRAST  Result Date: 03/21/2021 CLINICAL DATA:  Code stroke.  Neuro deficit, acute, stroke suspected EXAM: CT HEAD WITHOUT CONTRAST TECHNIQUE: Contiguous axial images were obtained from the base of the skull through the vertex without intravenous contrast. RADIATION DOSE REDUCTION: This exam was performed according to the departmental dose-optimization program which includes automated exposure control, adjustment of the mA and/or kV according to patient size and/or use of iterative reconstruction technique. COMPARISON:  CT May 27, 2020.  MRI May 28, 2020. FINDINGS: Brain: Remote infarcts in the right frontal cortex, insula and temporal lobe. No evidence of acute large vascular territory infarct, acute hemorrhage, mass lesion, hydrocephalus,  midline shift. Streak artifact limits evaluation. Partially empty sella. Similar inferior tonsillar descent. Vascular: No definite hyperdense vessel identified. Streak artifact limits evaluation. Skull: No acute fracture. Sinuses/Orbits: Opacification of scattered ethmoid air cells and inferior maxillary sinus retention cysts. No acute orbital findings. Other: No mastoid effusions. ASPECTS [facility] Stroke Program Early CT Score) total score (0-10 with 10 being normal): 10. IMPRESSION: 1. No evidence of acute large vascular territory infarct or acute hemorrhage. ASPECTS is 10. 2. Remote right MCA territory infarcts. 3. Similar inferior cerebellar tonsillar descent. Code stroke imaging results were  communicated on 03/21/2021 at 5:29 pm to provider Palikh via secure text paging. Electronically Signed   By: Feliberto Harts M.D.   On: 03/21/2021 17:30   CT ANGIO HEAD NECK W WO CM (CODE STROKE)  Result Date: 03/21/2021 CLINICAL DATA:  Neuro deficit, acute, stroke suspected EXAM: CT ANGIOGRAPHY HEAD AND NECK TECHNIQUE: Multidetector CT imaging of the head and neck was performed using the standard protocol during bolus administration of intravenous contrast. Multiplanar CT image reconstructions and MIPs were obtained to evaluate the vascular anatomy. Carotid stenosis measurements (when applicable) are obtained utilizing NASCET criteria, using the distal internal carotid diameter as the denominator. RADIATION DOSE REDUCTION: This exam was performed according to the departmental dose-optimization program which includes automated exposure control, adjustment of the mA and/or kV according to patient size and/or use of iterative reconstruction technique. CONTRAST:  64mL OMNIPAQUE IOHEXOL 350 MG/ML SOLN COMPARISON:  CTA May 27, 2020. FINDINGS: CTA NECK FINDINGS Aortic arch: Great vessel origins are patent. Right carotid system: Common carotid artery is patent without significant stenosis. There is eccentric intraluminal  filling defect within the proximal right ICA, which is not apparent on the comparison prior CTA. This filling defect also appears to extend into the lumen more superiorly. No significant (greater than 50%) stenosis. Tortuous ICA. Left carotid system: No evidence of dissection, stenosis (50% or greater) or occlusion. Retropharyngeal course. Tortuous ICA. Vertebral arteries: Mildly right dominant. Proximal left vertebral artery is obscured by streak artifact from pooled venous contrast. Within this limitation, no visible significant (greater than 50%) stenosis. The visible vertebral arteries are patent bilaterally. Skeleton: No acute abnormality on limited assessment. Other neck: No acute abnormality on limited assessment. Upper chest: Visualized lung apices are clear. Review of the MIP images confirms the above findings CTA HEAD FINDINGS Anterior circulation: Bilateral intracranial ICAs are patent without significant stenosis. The proximal bilateral MCAs and ACAs are patent without proximal hemodynamically significant stenosis. Tortuous right M2 MCA branch loops back on itself, but appears to remain patent. No aneurysm identified. Posterior circulation: Bilateral intradural vertebral arteries, basilar artery, and posterior cerebral arteries are patent without proximal hemodynamically significant stenosis. Mild bilateral PCA stenosis. Small left P1 PCA with prominent left posterior communicating artery, anatomic variant. No aneurysm identified. Venous sinuses: As permitted by contrast timing, patent. Review of the MIP images confirms the above findings IMPRESSION: 1. New filling defect in the proximal right ICA in the neck, concerning for intraluminal clot and possible plaque rupture. 2. No large vessel occlusion or proximal hemodynamically significant stenosis in the head or neck. Findings discussed with Dr. Viviann Spare via telephone at 5:32 p.m. Electronically Signed   By: Feliberto Harts M.D.   On: 03/21/2021 17:57     Procedures Procedures    Medications Ordered in ED Medications  iohexol (OMNIPAQUE) 350 MG/ML injection 60 mL (60 mLs Intravenous Contrast Given 03/21/21 1723)  gadobutrol (GADAVIST) 1 MMOL/ML injection 10 mL (10 mLs Intravenous Contrast Given 03/21/21 1847)    ED Course/ Medical Decision Making/ A&P                           Medical Decision Making Amount and/or Complexity of Data Reviewed Labs: ordered. Radiology: ordered.  Risk Decision regarding hospitalization.   CRITICAL CARE Performed by: Vanetta Mulders Total critical care time: 60 minutes Critical care time was exclusive of separately billable procedures and treating other patients. Critical care was necessary to treat or prevent imminent or life-threatening deterioration. Critical care was  time spent personally by me on the following activities: development of treatment plan with patient and/or surrogate as well as nursing, discussions with consultants, evaluation of patient's response to treatment, examination of patient, obtaining history from patient or surrogate, ordering and performing treatments and interventions, ordering and review of laboratory studies, ordering and review of radiographic studies, pulse oximetry and re-evaluation of patient's condition.  Patient's work-up here to include head CT without acute findings.  Had CT angio head and neck without any acute large vessel occlusion.  There was some question in the internal carotid artery.  As stated above.  MRI had no acute findings.  Did have evidence of the remote MCA on the right.  Patient still has some numbness to the face and numbness to the upper extremity on the right.  Neurology team is recommending admission for TIA.  Patient's labs without significant abnormalities other than a glucose of 154.  I-STAT hCG was negative.  Alcohol was negative CBC white count 3.8 hemoglobin 1418.2.  Complete metabolic panel with some mild hyponatremia with a sodium  134.  GFR was greater than 60.  Medicine will admit.  Final Clinical Impression(s) / ED Diagnoses Final diagnoses:  TIA (transient ischemic attack)    Rx / DC Orders ED Discharge Orders     None         Fredia Sorrow, MD 03/21/21 Venda Rodes, MD 03/21/21 1910    Fredia Sorrow, MD 03/21/21 1920

## 2021-03-22 ENCOUNTER — Observation Stay (HOSPITAL_COMMUNITY): Payer: BC Managed Care – PPO

## 2021-03-22 DIAGNOSIS — I639 Cerebral infarction, unspecified: Secondary | ICD-10-CM

## 2021-03-22 DIAGNOSIS — Z79899 Other long term (current) drug therapy: Secondary | ICD-10-CM | POA: Diagnosis not present

## 2021-03-22 DIAGNOSIS — G459 Transient cerebral ischemic attack, unspecified: Secondary | ICD-10-CM | POA: Diagnosis present

## 2021-03-22 DIAGNOSIS — I6389 Other cerebral infarction: Secondary | ICD-10-CM

## 2021-03-22 DIAGNOSIS — R29702 NIHSS score 2: Secondary | ICD-10-CM | POA: Diagnosis present

## 2021-03-22 DIAGNOSIS — E876 Hypokalemia: Secondary | ICD-10-CM | POA: Diagnosis present

## 2021-03-22 DIAGNOSIS — Z8673 Personal history of transient ischemic attack (TIA), and cerebral infarction without residual deficits: Secondary | ICD-10-CM | POA: Diagnosis not present

## 2021-03-22 DIAGNOSIS — Z888 Allergy status to other drugs, medicaments and biological substances status: Secondary | ICD-10-CM | POA: Diagnosis not present

## 2021-03-22 DIAGNOSIS — Z7951 Long term (current) use of inhaled steroids: Secondary | ICD-10-CM | POA: Diagnosis not present

## 2021-03-22 DIAGNOSIS — E669 Obesity, unspecified: Secondary | ICD-10-CM | POA: Diagnosis present

## 2021-03-22 DIAGNOSIS — Z794 Long term (current) use of insulin: Secondary | ICD-10-CM | POA: Diagnosis not present

## 2021-03-22 DIAGNOSIS — Z20822 Contact with and (suspected) exposure to covid-19: Secondary | ICD-10-CM | POA: Diagnosis present

## 2021-03-22 DIAGNOSIS — Z7985 Long-term (current) use of injectable non-insulin antidiabetic drugs: Secondary | ICD-10-CM | POA: Diagnosis not present

## 2021-03-22 DIAGNOSIS — G8191 Hemiplegia, unspecified affecting right dominant side: Secondary | ICD-10-CM | POA: Diagnosis present

## 2021-03-22 DIAGNOSIS — Z7984 Long term (current) use of oral hypoglycemic drugs: Secondary | ICD-10-CM | POA: Diagnosis not present

## 2021-03-22 DIAGNOSIS — I6521 Occlusion and stenosis of right carotid artery: Secondary | ICD-10-CM | POA: Diagnosis not present

## 2021-03-22 DIAGNOSIS — Z91010 Allergy to peanuts: Secondary | ICD-10-CM | POA: Diagnosis not present

## 2021-03-22 DIAGNOSIS — J45909 Unspecified asthma, uncomplicated: Secondary | ICD-10-CM | POA: Diagnosis present

## 2021-03-22 DIAGNOSIS — E1165 Type 2 diabetes mellitus with hyperglycemia: Secondary | ICD-10-CM | POA: Diagnosis present

## 2021-03-22 DIAGNOSIS — R4781 Slurred speech: Secondary | ICD-10-CM | POA: Diagnosis not present

## 2021-03-22 DIAGNOSIS — F17210 Nicotine dependence, cigarettes, uncomplicated: Secondary | ICD-10-CM | POA: Diagnosis present

## 2021-03-22 DIAGNOSIS — Z7982 Long term (current) use of aspirin: Secondary | ICD-10-CM | POA: Diagnosis not present

## 2021-03-22 DIAGNOSIS — R29810 Facial weakness: Secondary | ICD-10-CM | POA: Diagnosis present

## 2021-03-22 DIAGNOSIS — Z880 Allergy status to penicillin: Secondary | ICD-10-CM | POA: Diagnosis not present

## 2021-03-22 DIAGNOSIS — E871 Hypo-osmolality and hyponatremia: Secondary | ICD-10-CM | POA: Diagnosis present

## 2021-03-22 DIAGNOSIS — E119 Type 2 diabetes mellitus without complications: Secondary | ICD-10-CM

## 2021-03-22 DIAGNOSIS — I10 Essential (primary) hypertension: Secondary | ICD-10-CM | POA: Diagnosis present

## 2021-03-22 DIAGNOSIS — E785 Hyperlipidemia, unspecified: Secondary | ICD-10-CM | POA: Diagnosis present

## 2021-03-22 HISTORY — DX: Type 2 diabetes mellitus without complications: E11.9

## 2021-03-22 LAB — URINALYSIS, ROUTINE W REFLEX MICROSCOPIC
Bilirubin Urine: NEGATIVE
Glucose, UA: NEGATIVE mg/dL
Hgb urine dipstick: NEGATIVE
Ketones, ur: NEGATIVE mg/dL
Leukocytes,Ua: NEGATIVE
Nitrite: NEGATIVE
Protein, ur: NEGATIVE mg/dL
Specific Gravity, Urine: 1.04 — ABNORMAL HIGH (ref 1.005–1.030)
pH: 7 (ref 5.0–8.0)

## 2021-03-22 LAB — COMPREHENSIVE METABOLIC PANEL
ALT: 15 U/L (ref 0–44)
AST: 17 U/L (ref 15–41)
Albumin: 3.6 g/dL (ref 3.5–5.0)
Alkaline Phosphatase: 38 U/L (ref 38–126)
Anion gap: 10 (ref 5–15)
BUN: 11 mg/dL (ref 6–20)
CO2: 30 mmol/L (ref 22–32)
Calcium: 9.1 mg/dL (ref 8.9–10.3)
Chloride: 94 mmol/L — ABNORMAL LOW (ref 98–111)
Creatinine, Ser: 0.9 mg/dL (ref 0.44–1.00)
GFR, Estimated: >60 mL/min (ref >60)
Glucose, Bld: 129 mg/dL — ABNORMAL HIGH (ref 70–99)
Potassium: 2.4 mmol/L — CL (ref 3.5–5.1)
Sodium: 134 mmol/L — ABNORMAL LOW (ref 135–145)
Total Bilirubin: 0.8 mg/dL (ref 0.3–1.2)
Total Protein: 7.5 g/dL (ref 6.5–8.1)

## 2021-03-22 LAB — CBC
HCT: 40 % (ref 36.0–46.0)
Hemoglobin: 13.6 g/dL (ref 12.0–15.0)
MCH: 31.1 pg (ref 26.0–34.0)
MCHC: 34 g/dL (ref 30.0–36.0)
MCV: 91.3 fL (ref 80.0–100.0)
Platelets: 306 10*3/uL (ref 150–400)
RBC: 4.38 MIL/uL (ref 3.87–5.11)
RDW: 11.6 % (ref 11.5–15.5)
WBC: 4.8 10*3/uL (ref 4.0–10.5)
nRBC: 0 % (ref 0.0–0.2)

## 2021-03-22 LAB — PHOSPHORUS: Phosphorus: 2.6 mg/dL (ref 2.5–4.6)

## 2021-03-22 LAB — ECHOCARDIOGRAM COMPLETE
AR max vel: 2.87 cm2
AV Peak grad: 5.5 mmHg
Ao pk vel: 1.17 m/s
Area-P 1/2: 4.36 cm2
Calc EF: 58.1 %
Height: 67 in
S' Lateral: 2.9 cm
Single Plane A2C EF: 58.1 %
Single Plane A4C EF: 58.5 %
Weight: 3661.4 oz

## 2021-03-22 LAB — POTASSIUM: Potassium: 4.1 mmol/L (ref 3.5–5.1)

## 2021-03-22 LAB — RAPID URINE DRUG SCREEN, HOSP PERFORMED
Amphetamines: NOT DETECTED
Barbiturates: NOT DETECTED
Benzodiazepines: NOT DETECTED
Cocaine: NOT DETECTED
Opiates: NOT DETECTED
Tetrahydrocannabinol: POSITIVE — AB

## 2021-03-22 LAB — RAPID HIV SCREEN (HIV 1/2 AB+AG)
HIV 1/2 Antibodies: NONREACTIVE
HIV-1 P24 Antigen - HIV24: NONREACTIVE

## 2021-03-22 LAB — HEPARIN LEVEL (UNFRACTIONATED)
Heparin Unfractionated: 0.18 IU/mL — ABNORMAL LOW (ref 0.30–0.70)
Heparin Unfractionated: 0.17 IU/mL — ABNORMAL LOW (ref 0.30–0.70)

## 2021-03-22 LAB — GLUCOSE, CAPILLARY
Glucose-Capillary: 109 mg/dL — ABNORMAL HIGH (ref 70–99)
Glucose-Capillary: 134 mg/dL — ABNORMAL HIGH (ref 70–99)
Glucose-Capillary: 102 mg/dL — ABNORMAL HIGH (ref 70–99)
Glucose-Capillary: 167 mg/dL — ABNORMAL HIGH (ref 70–99)

## 2021-03-22 LAB — TSH: TSH: 2.378 u[IU]/mL (ref 0.350–4.500)

## 2021-03-22 LAB — HEMOGLOBIN A1C
Hgb A1c MFr Bld: 5.2 % (ref 4.8–5.6)
Mean Plasma Glucose: 102.54 mg/dL

## 2021-03-22 LAB — LIPID PANEL
Cholesterol: 75 mg/dL (ref 0–200)
HDL: 27 mg/dL — ABNORMAL LOW (ref >40)
LDL Cholesterol: 34 mg/dL (ref 0–99)
Total CHOL/HDL Ratio: 2.8 RATIO
Triglycerides: 69 mg/dL (ref <150)
VLDL: 14 mg/dL (ref 0–40)

## 2021-03-22 LAB — MAGNESIUM: Magnesium: 1.5 mg/dL — ABNORMAL LOW (ref 1.7–2.4)

## 2021-03-22 LAB — VITAMIN B12: Vitamin B-12: 493 pg/mL (ref 180–914)

## 2021-03-22 LAB — RPR: RPR Ser Ql: NONREACTIVE

## 2021-03-22 IMAGING — CT CT HEAD W/O CM
4 of 5 series · 14 of 47 positions shown, 16 images · non-contrast
Comparison: [DATE]

CLINICAL DATA: Code stroke follow-up



[Series 4: head without · axial · non-contrast · 0.38mm/px · z∈[-13,+27]mm · 3 of 18 slices shown (1 of 2)]
[im 5/18  brain]
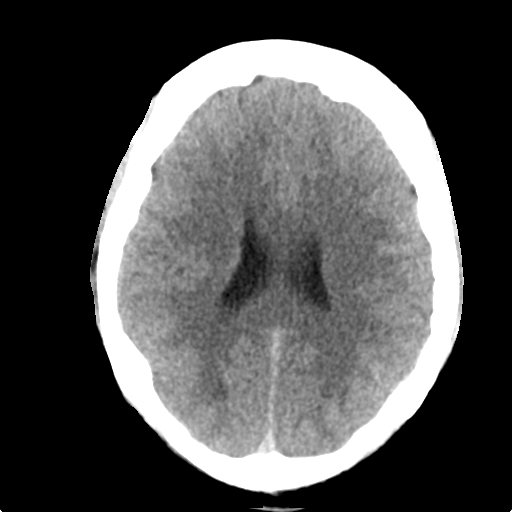
[im 9/18  brain]
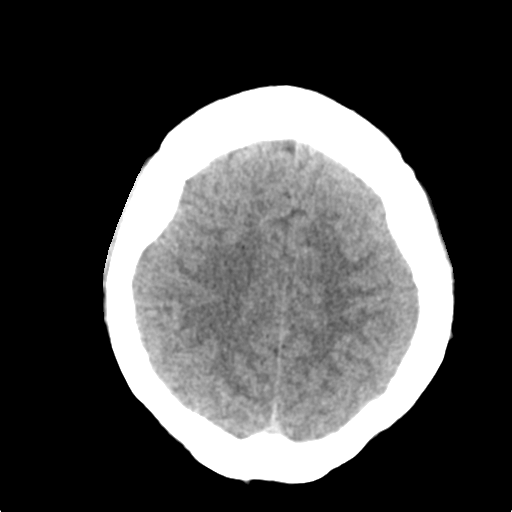
[im 13/18  brain]
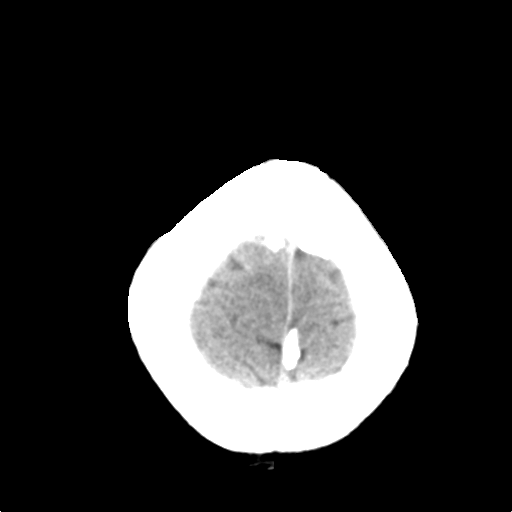

[Series 5: head without · axial · non-contrast · 0.44mm/px · z∈[-72,+28]mm · 5 of 30 slices shown, 7 images (2 of 2)]
[im 5/30  brain]
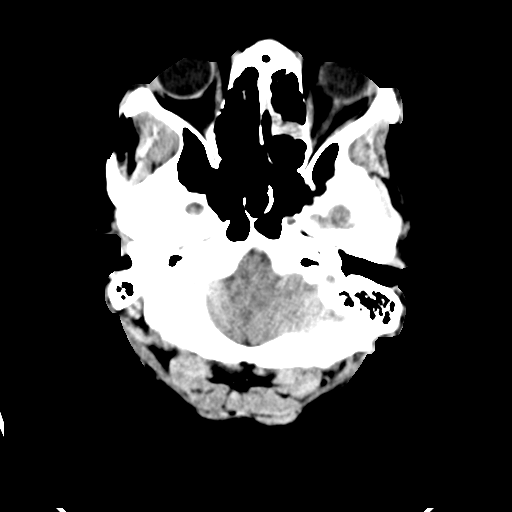
[im 5/30  bone]
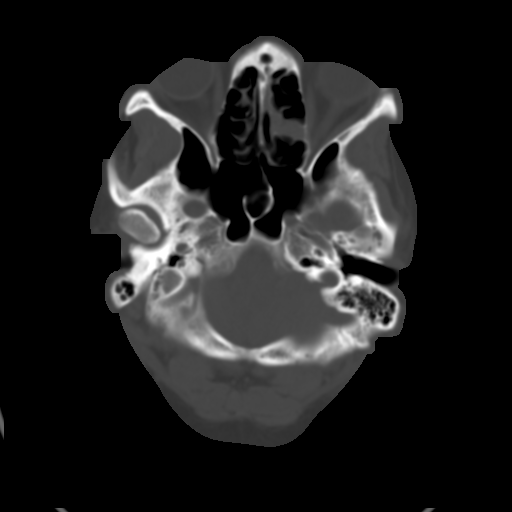
[im 10/30  brain]
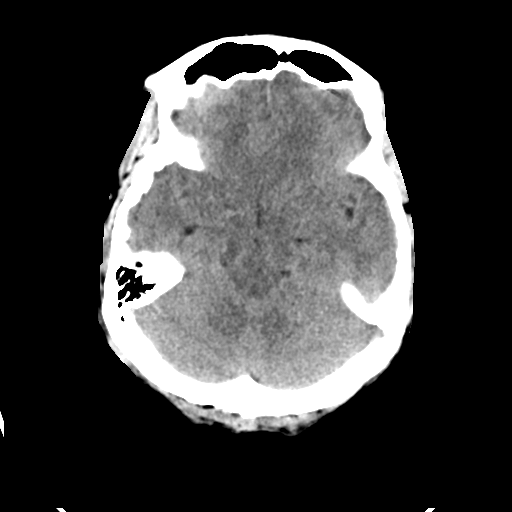
[im 15/30  brain]
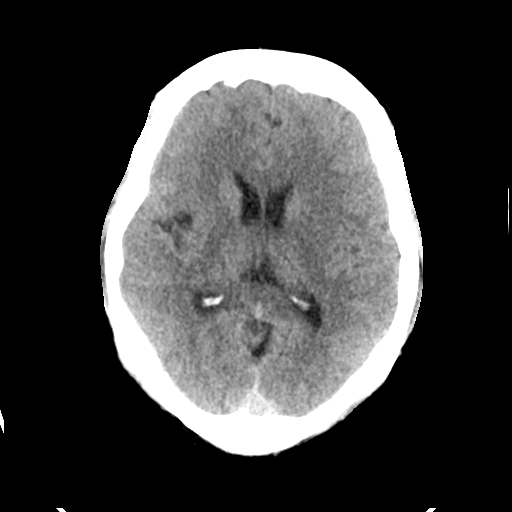
[im 20/30  brain]
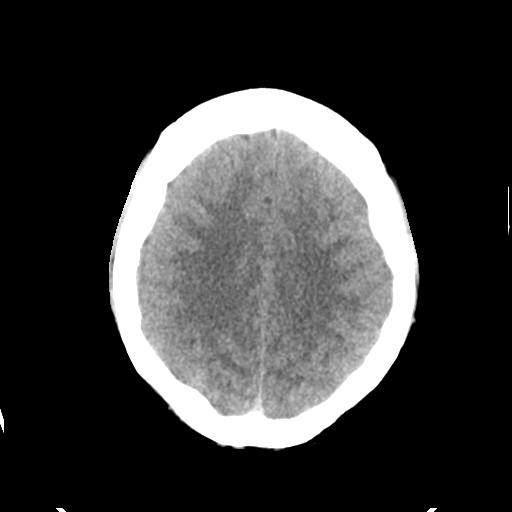
[im 25/30  brain]
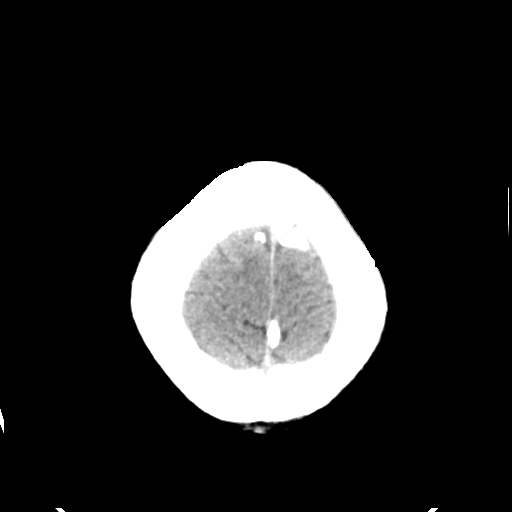
[im 25/30  bone]
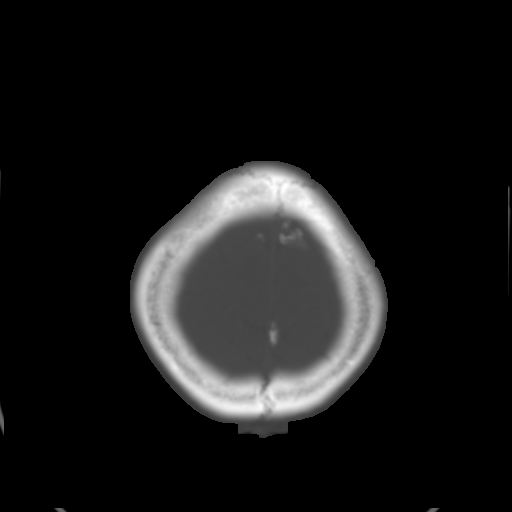

[Series 7: head without cor · coronal · non-contrast · 0.28mm/px · 3 of 67 slices shown]
[im 23/67  brain]
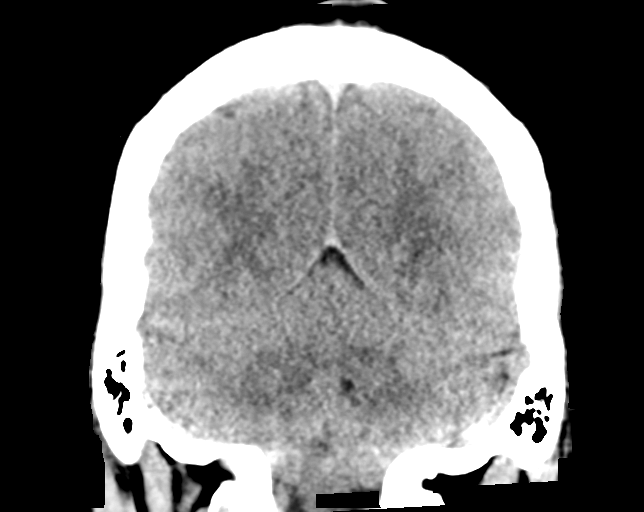
[im 30/67  brain]
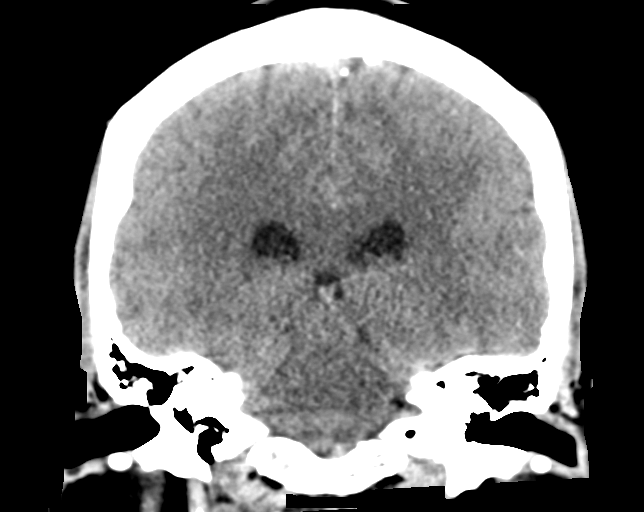
[im 37/67  brain]
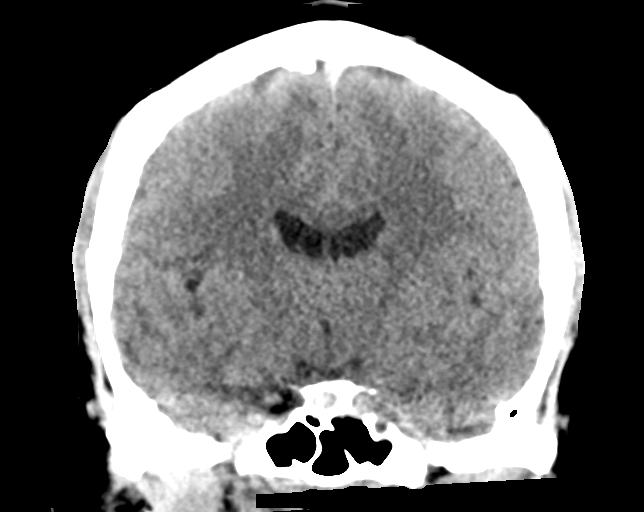

[Series 8: head without sag · sagittal · non-contrast · 0.28mm/px · 3 of 54 slices shown]
[im 18/54  brain]
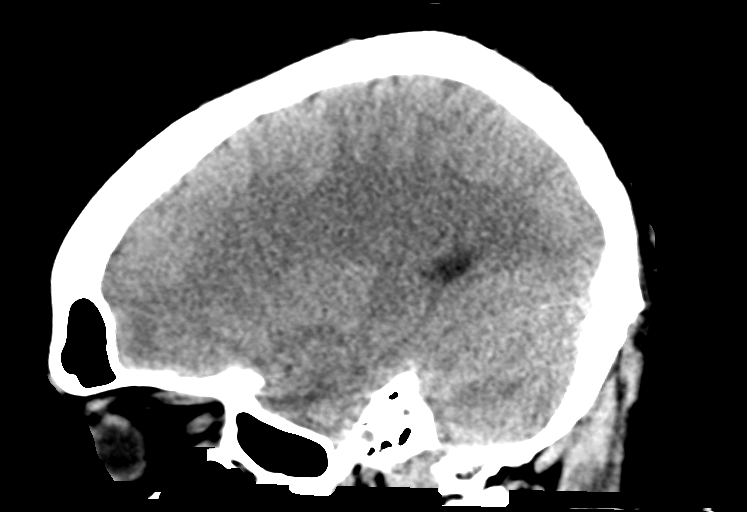
[im 27/54  brain]
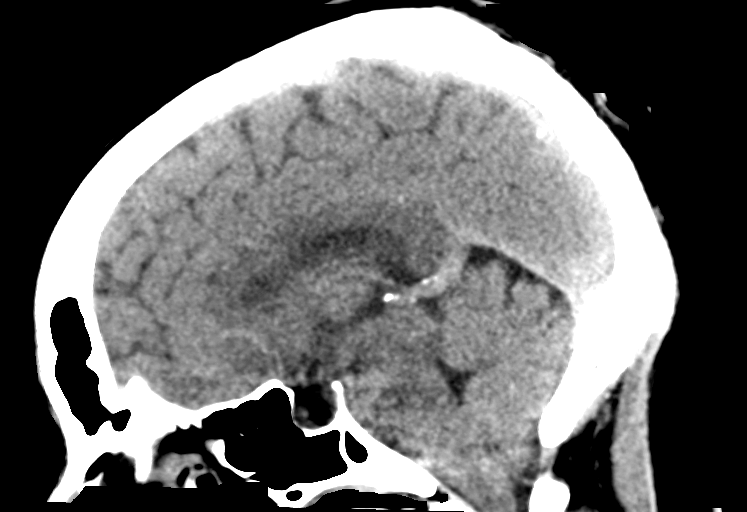
[im 36/54  brain]
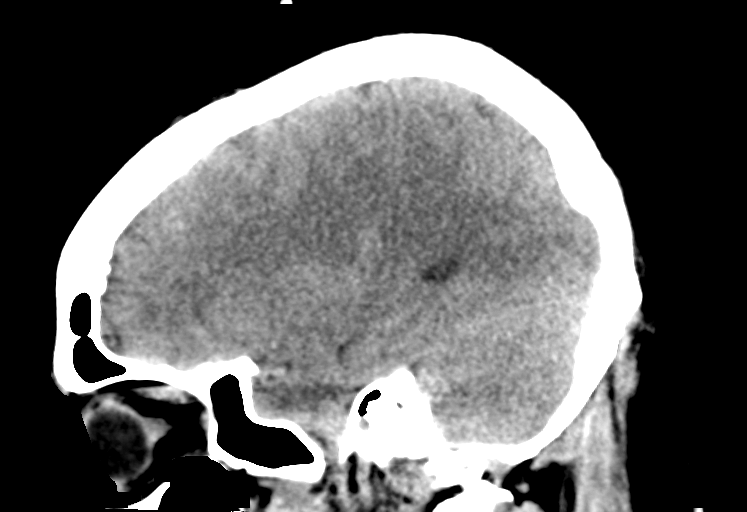

[14 of 47 positions shown; findings below may reference images not displayed]

FINDINGS: Brain: No acute hemorrhage. There is periventricular hypoattenuation
compatible with chronic microvascular disease. Unchanged appearance
of old right MCA territory infarcts.

Vascular: No abnormal hyperdensity of the major intracranial
arteries or dural venous sinuses. No intracranial atherosclerosis.

Skull: The visualized skull base, calvarium and extracranial soft
tissues are normal.

Sinuses/Orbits: No fluid levels or advanced mucosal thickening of
the visualized paranasal sinuses. No mastoid or middle ear effusion.
The orbits are normal.
IMPRESSION: 1. No acute intracranial abnormality.
2. Old right MCA territory infarcts and findings of chronic
microvascular ischemia.

## 2021-03-22 MED ORDER — POTASSIUM CHLORIDE 10 MEQ/100ML IV SOLN
10.0000 meq | INTRAVENOUS | Status: DC
  Administered 2021-03-22 (×2): 10 meq via INTRAVENOUS
  Filled 2021-03-22 (×3): qty 100

## 2021-03-22 MED ORDER — POTASSIUM CHLORIDE CRYS ER 20 MEQ PO TBCR
40.0000 meq | EXTENDED_RELEASE_TABLET | Freq: Two times a day (BID) | ORAL | Status: AC
  Administered 2021-03-22 (×2): 40 meq via ORAL
  Filled 2021-03-22 (×2): qty 2

## 2021-03-22 MED ORDER — GUAIFENESIN-DM 100-10 MG/5ML PO SYRP
10.0000 mL | ORAL_SOLUTION | ORAL | Status: DC | PRN
  Administered 2021-03-22 – 2021-03-25 (×7): 10 mL via ORAL
  Filled 2021-03-22 (×7): qty 10

## 2021-03-22 MED ORDER — ROSUVASTATIN CALCIUM 20 MG PO TABS
20.0000 mg | ORAL_TABLET | Freq: Every day | ORAL | Status: DC
  Administered 2021-03-22 – 2021-03-25 (×4): 20 mg via ORAL
  Filled 2021-03-22 (×4): qty 1

## 2021-03-22 MED ORDER — POTASSIUM CHLORIDE 10 MEQ/100ML IV SOLN
10.0000 meq | INTRAVENOUS | Status: AC
  Administered 2021-03-22 (×2): 10 meq via INTRAVENOUS
  Filled 2021-03-22: qty 100

## 2021-03-22 MED ORDER — KETOROLAC TROMETHAMINE 30 MG/ML IJ SOLN
30.0000 mg | Freq: Once | INTRAMUSCULAR | Status: AC
  Administered 2021-03-22: 30 mg via INTRAVENOUS
  Filled 2021-03-22: qty 1

## 2021-03-22 MED ORDER — MAGNESIUM SULFATE 2 GM/50ML IV SOLN
2.0000 g | Freq: Once | INTRAVENOUS | Status: AC
  Administered 2021-03-22: 2 g via INTRAVENOUS
  Filled 2021-03-22: qty 50

## 2021-03-22 NOTE — TOC CAGE-AID Note (Signed)
Transition of Care [facility]) - CAGE-AID Screening   Patient Details  Name: Christina [name] MRN: [id number] Date of Birth: [birth year]  Transition of Care [facility]) CM/SW Contact:    Garrett Mitchum C [name], [name] Phone Number: 03/22/2021, 10:27 AM   Clinical Narrative: Pt is unable to participate in Cage Aid. Pt is currently not in room.  CSW will assess at a better time.  Christina [name], MSW, LCSW-A Pronouns:  She/Her/Hers [place] Transitions of Care Clinical Social Worker Direct Number:  [phone] [email or web address]  CAGE-AID Screening: Substance Abuse Screening unable to be completed due to: : Patient unable to participate (Pt is currently out of room.)             Substance Abuse Education Offered: Yes

## 2021-03-22 NOTE — Progress Notes (Signed)
ANTICOAGULATION CONSULT NOTE - follow-up  Pharmacy Consult for IV heparin Indication:  ICA dissection  Allergies  Allergen Reactions   Peanut-Containing Drug Products Anaphylaxis   Lisinopril Other (See Comments)    Lip swelling   Penicillins Rash   Tomato Rash    Fresh tomatoes     Patient Measurements: Height: 5\' 7"  (170.2 cm) Weight: 103.8 kg (228 lb 13.4 oz) IBW/kg (Calculated) : 61.6 Heparin Dosing Weight: 85 kg  Vital Signs: Temp: 97.9 F (36.6 C) (02/17 1503) Temp Source: Oral (02/17 1503) BP: 125/89 (02/17 1503) Pulse Rate: 85 (02/17 1503)  Labs: Recent Labs    03/21/21 1718 03/21/21 1723 03/22/21 0833 03/22/21 1551  HGB 14.2 15.3* 13.6  --   HCT 43.7 45.0 40.0  --   PLT 303  --  306  --   APTT 29  --   --   --   LABPROT 14.6  --   --   --   INR 1.1  --   --   --   HEPARINUNFRC  --   --  0.18* 0.17*  CREATININE 1.03* 1.10* 0.90  --      Estimated Creatinine Clearance: 105 mL/min (by C-G formula based on SCr of 0.9 mg/dL).   Medical History: Past Medical History:  Diagnosis Date   Asthma    Diabetes mellitus without complication (HCC)    Hypertension    Stroke [facility])     Assessment: 41 YO female admitted for code stroke. 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 TNKase was NOT administered. CBC stable, H/H 13.6/40.   Heparin paused overnight for CT head. Patient did not receive bolus, and heparin was restarted soon after the CT of the head and not expected to drastically impact heparin level.   03/22/2021 @ 15:51: HL 0.17  Goal of Therapy:  Heparin level 0.3-0.5 units/ml Monitor platelets by anticoagulation protocol: Yes   Plan:  Increase heparin gtt to 1350 units/hr. F/up heparin level at 23:00 Monitor CBC, HL, and s/sx of bleeding daily   Tamura Lasky BS, PharmD, BCPS Clinical Pharmacist 03/22/2021 5:02 PM

## 2021-03-22 NOTE — ED Notes (Signed)
At approx 0028 pt began to have worsening numbness to right arm and slurred speech with minor facial droop. Pt remained A&Ox4, no weakness/drift. [facility] Neurologist was paged orders for stat CT head and stopping heparin drip obtained. Pt transported for stat CT head. While returning to room at 0056 pt reported improvement of symptoms. Resolved slurred speech and facial droop and improvement of numbness. NIH 1. Pt stat CT head resulted and heparin was restarted.

## 2021-03-22 NOTE — TOC CAGE-AID Note (Signed)
Transition of Care [facility]) - CAGE-AID Screening   Patient Details  Name: Christina [name] MRN: [id number] Date of Birth: [birth year]  [facility]) CM/SW Contact:    Tamaiya Bump C Tarpley-Carter, [name] Phone Number: 03/22/2021, 3:09 PM   Clinical Narrative: Pt is unable to participate in Cage Aid. Pt was not present in room.  CSW will assess at a better time.  Zeriyah Wain Tarpley-Carter, MSW, LCSW-A Pronouns:  She/Her/Hers Nikolai Transitions of Care Clinical Social Worker Direct Number:  [phone] [email or web address]    CAGE-AID Screening: Substance Abuse Screening unable to be completed due to: : Patient unable to participate             Substance Abuse Education Offered: Yes

## 2021-03-22 NOTE — Progress Notes (Signed)
PT Cancellation Note  Patient Details Name: Christina [name] MRN: [id number] DOB: 09-26-1980   Cancelled Treatment:    Reason Eval/Treat Not Completed: PT screened, no needs identified, will sign off. Per discussion with OT, the pt is completely independent with no acute PT needs, thank you for the consult please re-consult if change in status.   Vickki Muff, PT, DPT   Acute Rehabilitation Department Pager #: [phone]   Ronnie Derby 03/22/2021, 2:40 PM

## 2021-03-22 NOTE — Evaluation (Signed)
Occupational Therapy Evaluation Patient Details Name: Christina [name] MRN: [id number] DOB: 09-Oct-1980 Today's Date: 03/22/2021   History of Present Illness Christina [name] is a 41 y.o. female presents to [facility] today with a chief complaint of right-sided weakness, numbness, and facial droop. 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   Clinical Impression   This 41 yo female admitted with above presents to acute OT with PLOF of being totally independent with basic ADLs, IADLs, driving, and working. Currently her only report is that she has numbness in the left side of her tongue--nothing else feels numb or checks out as numb and she is not having any motor issues nor vision issues. No further OT needs identified and not PT needs noted (made them aware), we will sign off.      Recommendations for follow up therapy are one component of a multi-disciplinary discharge planning process, led by the attending physician.  Recommendations may be updated based on patient status, additional functional criteria and insurance authorization.   Follow Up Recommendations  No OT follow up    Assistance Recommended at Discharge None     Functional Status Assessment  Patient has not had a recent decline in their functional status  Equipment Recommendations  None recommended by OT       Precautions / Restrictions Precautions Precautions: None Restrictions Weight Bearing Restrictions: No      Mobility Bed Mobility Overal bed mobility: Independent                  Transfers Overall transfer level: Independent                        Balance Overall balance assessment: Independent                                         ADL either performed or assessed with clinical judgement   ADL Overall ADL's : Independent                                              Vision Patient Visual Report: No change from baseline              Pertinent Vitals/Pain Pain Assessment Pain Assessment: No/denies pain     Hand Dominance Right   Extremity/Trunk Assessment Upper Extremity Assessment Upper Extremity Assessment: Overall WFL for tasks assessed   Lower Extremity Assessment Lower Extremity Assessment: Overall WFL for tasks assessed       Communication Communication Communication: No difficulties   Cognition Arousal/Alertness: Awake/alert Behavior During Therapy: WFL for tasks assessed/performed Overall Cognitive Status: Within Functional Limits for tasks assessed                                                  Home Living Family/patient expects to be discharged to:: Private residence Living Arrangements: Spouse/significant other;Children Available Help at Discharge: Family Type of Home: Apartment Home Access: Stairs to enter Secretary/administrator of Steps: 10 +10 Entrance Stairs-Rails:  (right or left)  Bathroom Shower/Tub: Chief Strategy Officer: Standard     Home Equipment: None   Additional Comments: 41 y.o., 39 y.o., 41 y.o. (at college)      Prior Functioning/Environment Prior Level of Function : Independent/Modified Independent;Working/employed;Driving                                 OT Goals(Current goals can be found in the care plan section) Acute Rehab OT Goals Patient Stated Goal: to figure out what is causing symptoms         AM-PAC OT "6 Clicks" Daily Activity     Outcome Measure Help from another person eating meals?: None Help from another person taking care of personal grooming?: None Help from another person toileting, which includes using toliet, bedpan, or urinal?: None Help from another person bathing (including washing, rinsing, drying)?: None Help from another person to put on and taking off regular upper body clothing?: None Help  from another person to put on and taking off regular lower body clothing?: None 6 Click Score: 24   End of Session Nurse Communication:  (IV keeps beeping occluded)  Activity Tolerance: Patient tolerated treatment well Patient left: in bed;with call bell/phone within reach  OT Visit Diagnosis:  (right sided numbness)                Time: 6767-2094 OT Time Calculation (min): 20 min Charges:  OT General Charges $OT Visit: 1 Visit OT Evaluation $OT Eval Moderate Complexity: 1 Mod  Ignacia Palma, OTR/L Acute [employer] Pager [phone] Office [phone]    Evette Georges 03/22/2021, 3:14 PM

## 2021-03-22 NOTE — Plan of Care (Signed)
  Problem: Education: Goal: Knowledge of General Education information will improve Description Including pain rating scale, medication(s)/side effects and non-pharmacologic comfort measures Outcome: Progressing   

## 2021-03-22 NOTE — Progress Notes (Signed)
STROKE TEAM PROGRESS NOTE   SUBJECTIVE (INTERVAL HISTORY) No family is at the bedside.  Overall her condition is stable.  Patient lying in bed, stated that since Tuesday she had persistent episodes of sneeze clusters.  She had for stroke which was right after she sneezed in 05/2020.  She believes that her stroke was caused by sneeze.  Her right-sided numbness in the left hand numbness tingling has resolved, however still feel left cheek decreased sensation.   OBJECTIVE Temp:  [97.6 F (36.4 C)-98.4 F (36.9 C)] 97.6 F (36.4 C) (02/17 1919) Pulse Rate:  [69-99] 69 (02/17 1919) Cardiac Rhythm: Normal sinus rhythm (02/17 0744) Resp:  [10-20] 18 (02/17 1919) BP: (107-135)/(73-100) 117/83 (02/17 1919) SpO2:  [92 %-100 %] 99 % (02/17 1919)  Recent Labs  Lab 03/21/21 1712 03/21/21 2320 03/22/21 0610 03/22/21 1131 03/22/21 1645  GLUCAP 151* 96 109* 134* 102*   Recent Labs  Lab 03/21/21 1718 03/21/21 1723 03/21/21 2358 03/22/21 0833 03/22/21 1551  NA 134* 136  --  134*  --   K 3.5 3.5  --  2.4* 4.1  CL 94* 92*  --  94*  --   CO2 29  --   --  30  --   GLUCOSE 157* 154*  --  129*  --   BUN 12 14  --  11  --   CREATININE 1.03* 1.10*  --  0.90  --   CALCIUM 8.9  --   --  9.1  --   MG  --   --  1.5*  --   --   PHOS  --   --  2.6  --   --    Recent Labs  Lab 03/21/21 1718 03/22/21 0833  AST 21 17  ALT 16 15  ALKPHOS 43 38  BILITOT 1.2 0.8  PROT 7.9 7.5  ALBUMIN 3.8 3.6   Recent Labs  Lab 03/21/21 1718 03/21/21 1723 03/22/21 0833  WBC 3.8*  --  4.8  NEUTROABS 1.5*  --   --   HGB 14.2 15.3* 13.6  HCT 43.7 45.0 40.0  MCV 94.4  --  91.3  PLT 303  --  306   No results for input(s): CKTOTAL, CKMB, CKMBINDEX, TROPONINI in the last 168 hours. Recent Labs    03/21/21 1718  LABPROT 14.6  INR 1.1   Recent Labs    03/21/21 0001  COLORURINE YELLOW  LABSPEC 1.040*  PHURINE 7.0  GLUCOSEU NEGATIVE  HGBUR NEGATIVE  BILIRUBINUR NEGATIVE  KETONESUR NEGATIVE   PROTEINUR NEGATIVE  NITRITE NEGATIVE  LEUKOCYTESUR NEGATIVE       Component Value Date/Time   CHOL 75 03/21/2021 2358   TRIG 69 03/21/2021 2358   HDL 27 (L) 03/21/2021 2358   CHOLHDL 2.8 03/21/2021 2358   VLDL 14 03/21/2021 2358   LDLCALC 34 03/21/2021 2358   Lab Results  Component Value Date   HGBA1C 5.2 03/21/2021      Component Value Date/Time   LABOPIA NONE DETECTED 03/21/2021 0001   COCAINSCRNUR NONE DETECTED 03/21/2021 0001   LABBENZ NONE DETECTED 03/21/2021 0001   AMPHETMU NONE DETECTED 03/21/2021 0001   THCU POSITIVE (A) 03/21/2021 0001   LABBARB NONE DETECTED 03/21/2021 0001    Recent Labs  Lab 03/21/21 1718  ETH <10    I have personally reviewed the radiological images below and agree with the radiology interpretations.  CT HEAD WO CONTRAST ( )  Result Date: 03/22/2021 CLINICAL DATA:  Code stroke follow-up EXAM: CT HEAD  WITHOUT CONTRAST TECHNIQUE: Contiguous axial images were obtained from the base of the skull through the vertex without intravenous contrast. RADIATION DOSE REDUCTION: This exam was performed according to the departmental dose-optimization program which includes automated exposure control, adjustment of the mA and/or kV according to patient size and/or use of iterative reconstruction technique. COMPARISON:  03/21/2021 FINDINGS: Brain: No acute hemorrhage. There is periventricular hypoattenuation compatible with chronic microvascular disease. Unchanged appearance of old right MCA territory infarcts. Vascular: No abnormal hyperdensity of the major intracranial arteries or dural venous sinuses. No intracranial atherosclerosis. Skull: The visualized skull base, calvarium and extracranial soft tissues are normal. Sinuses/Orbits: No fluid levels or advanced mucosal thickening of the visualized paranasal sinuses. No mastoid or middle ear effusion. The orbits are normal. IMPRESSION: 1. No acute intracranial abnormality. 2. 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 Electronically Signed   By: Deatra Robinson M.D.   On: 03/22/2021 01:07   MR ANGIO NECK W WO CONTRAST  Result Date: 03/21/2021 CLINICAL DATA:  Abnormal CTA with filling defect right ICA EXAM: MRA NECK WITHOUT AND WITH CONTRAST TECHNIQUE: Multiplanar and multiecho pulse sequences of the neck were obtained without and with intravenous contrast. Angiographic images of the neck were obtained using MRA technique without and with intravenous contrast. CONTRAST:  10mL GADAVIST GADOBUTROL 1 MMOL/ML IV SOLN COMPARISON:  CTA head and neck 03/21/2021 FINDINGS: MRI degraded by motion. MRA confirms a filling defect in the proximal right ICA causing mild narrowing of the lumen. Remainder of the right internal carotid artery normal Left carotid bifurcation normal without stenosis or irregularity Both vertebral arteries normal and widely patent. Antegrade flow in the carotid and vertebral arteries bilaterally. IMPRESSION: Filling defect in the proximal right ICA causing mild luminal stenosis. This confirms the finding on CT angiogram. This could be due to underlying plaque rupture or dissection. These results were called by telephone at the time of interpretation on 03/21/2021 at 6:55 pm to provider Palikh, who verbally acknowledged these results. Electronically Signed   By: Marlan Palau M.D.   On: 03/21/2021 18:56   MR BRAIN WO CONTRAST  Result Date: 03/21/2021 CLINICAL DATA:  Neuro deficit, acute, stroke suspected EXAM: MRI HEAD WITHOUT CONTRAST TECHNIQUE: Multiplanar, multiecho pulse sequences of the brain and surrounding structures were obtained without intravenous contrast. COMPARISON:  MRI May 28, 2020. FINDINGS: Brain: No acute infarction, hemorrhage, hydrocephalus, extra-axial collection or mass lesion. Remote right MCA territory infarcts involving the insula and overlying operculum. A couple small T2/FLAIR hyperintensities the white matter, nonspecific but both chronic  microvascular disease. As before, the cerebellar tonsils extend 5-6 mm below the level of the foramen magnum Vascular: Major arterial flow voids are maintained at the skull base. Further evaluated on same day CTA. Skull and upper cervical spine: Normal marrow signal. Sinuses/Orbits: Mild paranasal sinus mucosal thickening with opacified left posterior ethmoid air cell and bilateral inferior maxillary sinus retention cysts. Other: No mastoid effusions. IMPRESSION: 1. No evidence of acute infarct. 2. Remote right MCA territory infarcts. 3. As before, the cerebellar tonsils extend 5-6 mm below the level of the foramen magnum with mild crowding at the level of the foramen magnum. 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 As noted on the prior, cerebellar tonsillar ectopia may be seen in the setting of idiopathic intracranial hypertension or intracranial hypotension. There is a partially empty sella turcica, which can also be seen with intracranial hypertension. Electronically Signed   By: Juluis Mire.D.  On: 03/21/2021 18:12   DG CHEST PORT 1 VIEW  Result Date: 03/21/2021 CLINICAL DATA:  Cough EXAM: PORTABLE CHEST 1 VIEW COMPARISON:  09/16/2019 FINDINGS: The heart size and mediastinal contours are within normal limits. Both lungs are clear. The visualized skeletal structures are unremarkable. IMPRESSION: No active disease. Electronically Signed   By: Sharlet SalinaMichael  Brown M.D.   On: 03/21/2021 20:25   ECHOCARDIOGRAM COMPLETE  Result Date: 03/22/2021    ECHOCARDIOGRAM REPORT   Patient Name:   Rico AlaALANDA Socorro Date of Exam: 03/22/2021 Medical Rec #:  [id number]       Height:       67.0 in Accession #:    [id number]      Weight:       228.8 lb Date of Birth:  12/31/1980        BSA:          2.141 m Patient Age:    40 years        BP:           123/85 mmHg Patient Gender: F               HR:           88 bpm. Exam Location:  Inpatient Procedure: 2D Echo, Cardiac Doppler and Color Doppler Indications:    Stroke   History:        Patient has prior history of Echocardiogram examinations.                 Stroke.  Sonographer:    Cleatis Polkaaylor Peper Referring Phys: [id number] CAROLYN GUILLOUD IMPRESSIONS  1. Left ventricular ejection fraction, by estimation, is 55 to 60%. The left ventricle has normal function. The left ventricle has no regional wall motion abnormalities. Left ventricular diastolic parameters are consistent with Grade II diastolic dysfunction (pseudonormalization).  2. Right ventricular systolic function is normal. The right ventricular size is normal. Tricuspid regurgitation signal is inadequate for assessing PA pressure.  3. The mitral valve is normal in structure. No evidence of mitral valve regurgitation. No evidence of mitral stenosis.  4. The aortic valve is tricuspid. Aortic valve regurgitation is not visualized. No aortic stenosis is present.  5. The inferior vena cava is normal in size with greater than 50% respiratory variability, suggesting right atrial pressure of 3 mmHg. FINDINGS  Left Ventricle: Left ventricular ejection fraction, by estimation, is 55 to 60%. The left ventricle has normal function. The left ventricle has no regional wall motion abnormalities. The left ventricular internal cavity size was normal in size. There is  no left ventricular hypertrophy. Left ventricular diastolic parameters are consistent with Grade II diastolic dysfunction (pseudonormalization). Right Ventricle: The right ventricular size is normal. No increase in right ventricular wall thickness. Right ventricular systolic function is normal. Tricuspid regurgitation signal is inadequate for assessing PA pressure. Left Atrium: Left atrial size was normal in size. Right Atrium: Right atrial size was normal in size. Pericardium: There is no evidence of pericardial effusion. Mitral Valve: The mitral valve is normal in structure. There is mild calcification of the mitral valve leaflet(s). No evidence of mitral valve regurgitation. No  evidence of mitral valve stenosis. Tricuspid Valve: The tricuspid valve is normal in structure. Tricuspid valve regurgitation is not demonstrated. Aortic Valve: The aortic valve is tricuspid. Aortic valve regurgitation is not visualized. No aortic stenosis is present. Aortic valve peak gradient measures 5.5 mmHg. Pulmonic Valve: The pulmonic valve was normal in structure. Pulmonic valve regurgitation is not visualized. Aorta: The  aortic root is normal in size and structure. Venous: The inferior vena cava is normal in size with greater than 50% respiratory variability, suggesting right atrial pressure of 3 mmHg. IAS/Shunts: No atrial level shunt detected by color flow Doppler.  LEFT VENTRICLE PLAX 2D LVIDd:         4.50 cm     Diastology LVIDs:         2.90 cm     LV e' medial:    6.85 cm/s LV PW:         1.00 cm     LV E/e' medial:  10.9 LV IVS:        0.90 cm     LV e' lateral:   10.80 cm/s LVOT diam:     2.00 cm     LV E/e' lateral: 6.9 LV SV:         59 LV SV Index:   28 LVOT Area:     3.14 cm  LV Volumes (MOD) LV vol d, MOD A2C: 76.2 ml LV vol d, MOD A4C: 85.8 ml LV vol s, MOD A2C: 31.9 ml LV vol s, MOD A4C: 35.6 ml LV SV MOD A2C:     44.3 ml LV SV MOD A4C:     85.8 ml LV SV MOD BP:      47.6 ml RIGHT VENTRICLE             IVC RV Basal diam:  2.90 cm     IVC diam: 1.70 cm RV Mid diam:    1.90 cm RV S prime:     12.20 cm/s TAPSE (M-mode): 1.8 cm LEFT ATRIUM             Index        RIGHT ATRIUM           Index LA diam:        3.30 cm 1.54 cm/m   RA Area:     10.40 cm LA Vol (A2C):   25.6 ml 11.95 ml/m  RA Volume:   19.90 ml  9.29 ml/m LA Vol (A4C):   24.6 ml 11.49 ml/m LA Biplane Vol: 25.1 ml 11.72 ml/m  AORTIC VALVE AV Area (Vmax): 2.87 cm AV Vmax:        117.00 cm/s AV Peak Grad:   5.5 mmHg LVOT Vmax:      107.00 cm/s LVOT Vmean:     75.600 cm/s LVOT VTI:       0.188 m  AORTA Ao Root diam: 3.00 cm Ao Asc diam:  2.70 cm MITRAL VALVE MV Area (PHT): 4.36 cm    SHUNTS MV Decel Time: 174 msec    Systemic  VTI:  0.19 m MV E velocity: 74.40 cm/s  Systemic Diam: 2.00 cm MV A velocity: 75.10 cm/s MV E/A ratio:  0.99 Dalton McleanMD Electronically signed by Wilfred Lacy Signature Date/Time: 03/22/2021/2:06:25 PM    Final    CT HEAD CODE STROKE WO CONTRAST  Result Date: 03/21/2021 CLINICAL DATA:  Code stroke.  Neuro deficit, acute, stroke suspected EXAM: CT HEAD WITHOUT CONTRAST TECHNIQUE: Contiguous axial images were obtained from the base of the skull through the vertex without intravenous contrast. RADIATION DOSE REDUCTION: This exam was performed according to the departmental dose-optimization program which includes automated exposure control, adjustment of the mA and/or kV according to patient size and/or use of iterative reconstruction technique. COMPARISON:  CT May 27, 2020.  MRI May 28, 2020. FINDINGS: Brain: Remote infarcts in the right frontal  cortex, insula and temporal lobe. No evidence of acute large vascular territory infarct, acute hemorrhage, mass lesion, hydrocephalus, midline shift. Streak artifact limits evaluation. Partially empty sella. Similar inferior tonsillar descent. Vascular: No definite hyperdense vessel identified. Streak artifact limits evaluation. Skull: No acute fracture. Sinuses/Orbits: Opacification of scattered ethmoid air cells and inferior maxillary sinus retention cysts. No acute orbital findings. Other: No mastoid effusions. ASPECTS [facility] Stroke Program Early CT Score) total score (0-10 with 10 being normal): 10. IMPRESSION: 1. No evidence of acute large vascular territory infarct or acute hemorrhage. ASPECTS is 10. 2. Remote right MCA territory infarcts. 3. Similar inferior cerebellar tonsillar descent. Code stroke imaging results were communicated on 03/21/2021 at 5:29 pm to provider Palikh via secure text paging. Electronically Signed   By: Feliberto Harts M.D.   On: 03/21/2021 17:30   VAS [place] LOWER EXTREMITY VENOUS (DVT)  Result Date: 03/22/2021  Lower Venous DVT  Study Patient Name:  DESIREY KEAHEY  Date of Exam:   03/22/2021 Medical Rec #: [id number]        Accession #:    [id number] Date of Birth: 06/21/1980         Patient Gender: F Patient Age:   44 years Exam Location:  [facility] Procedure:      VAS [place] LOWER EXTREMITY VENOUS (DVT) Referring Phys: Scheryl Marten Chaden Doom --------------------------------------------------------------------------------  Indications: Stroke.  Comparison Study: 05/28/2020 negative lower extremity venous duplex Performing Technologist: Gertie Fey MHA, RDMS, RVT, RDCS  Examination Guidelines: A complete evaluation includes B-mode imaging, spectral Doppler, color Doppler, and power Doppler as needed of all accessible portions of each vessel. Bilateral testing is considered an integral part of a complete examination. Limited examinations for reoccurring indications may be performed as noted. The reflux portion of the exam is performed with the patient in reverse Trendelenburg.  +---------+---------------+---------+-----------+----------+--------------+  RIGHT     Compressibility Phasicity Spontaneity Properties Thrombus Aging  +---------+---------------+---------+-----------+----------+--------------+  CFV       Full            Yes       Yes                                    +---------+---------------+---------+-----------+----------+--------------+  SFJ       Full                                                             +---------+---------------+---------+-----------+----------+--------------+  FV Prox   Full                                                             +---------+---------------+---------+-----------+----------+--------------+  FV Mid    Full                                                             +---------+---------------+---------+-----------+----------+--------------+  FV Distal  Full                                                              +---------+---------------+---------+-----------+----------+--------------+  PFV       Full                                                             +---------+---------------+---------+-----------+----------+--------------+  POP       Full            Yes       Yes                                    +---------+---------------+---------+-----------+----------+--------------+  PTV       Full                                                             +---------+---------------+---------+-----------+----------+--------------+  PERO      Full                                                             +---------+---------------+---------+-----------+----------+--------------+   +---------+---------------+---------+-----------+----------+--------------+  LEFT      Compressibility Phasicity Spontaneity Properties Thrombus Aging  +---------+---------------+---------+-----------+----------+--------------+  CFV       Full            Yes       Yes                                    +---------+---------------+---------+-----------+----------+--------------+  SFJ       Full                                                             +---------+---------------+---------+-----------+----------+--------------+  FV Prox   Full                                                             +---------+---------------+---------+-----------+----------+--------------+  FV Mid    Full                                                             +---------+---------------+---------+-----------+----------+--------------+  FV Distal Full                                                             +---------+---------------+---------+-----------+----------+--------------+  PFV       Full                                                             +---------+---------------+---------+-----------+----------+--------------+  POP       Full            Yes       Yes                                     +---------+---------------+---------+-----------+----------+--------------+  PTV       Full                                                             +---------+---------------+---------+-----------+----------+--------------+  PERO      Full                                                             +---------+---------------+---------+-----------+----------+--------------+     Summary: RIGHT: - There is no evidence of deep vein thrombosis in the lower extremity.  - No cystic structure found in the popliteal fossa.  LEFT: - There is no evidence of deep vein thrombosis in the lower extremity.  - No cystic structure found in the popliteal fossa.  *See table(s) above for measurements and observations. Electronically signed by Orlie Pollen on 03/22/2021 at 6:33:52 PM.    Final    CT ANGIO HEAD NECK W WO CM (CODE STROKE)  Result Date: 03/21/2021 CLINICAL DATA:  Neuro deficit, acute, stroke suspected EXAM: CT ANGIOGRAPHY HEAD AND NECK TECHNIQUE: Multidetector CT imaging of the head and neck was performed using the standard protocol during bolus administration of intravenous contrast. Multiplanar CT image reconstructions and MIPs were obtained to evaluate the vascular anatomy. Carotid stenosis measurements (when applicable) are obtained utilizing NASCET criteria, using the distal internal carotid diameter as the denominator. RADIATION DOSE REDUCTION: This exam was performed according to the departmental dose-optimization program which includes automated exposure control, adjustment of the mA and/or kV according to patient size and/or use of iterative reconstruction technique. CONTRAST:  7mL OMNIPAQUE IOHEXOL 350 MG/ML SOLN COMPARISON:  CTA May 27, 2020. FINDINGS: CTA NECK FINDINGS Aortic arch: Great vessel origins are patent. Right carotid system: Common carotid artery is patent without significant stenosis. There is eccentric intraluminal filling defect within the proximal right ICA, which is not apparent on the  comparison prior CTA. This filling defect also appears to extend into the lumen more superiorly.  No significant (greater than 50%) stenosis. Tortuous ICA. Left carotid system: No evidence of dissection, stenosis (50% or greater) or occlusion. Retropharyngeal course. Tortuous ICA. Vertebral arteries: Mildly right dominant. Proximal left vertebral artery is obscured by streak artifact from pooled venous contrast. Within this limitation, no visible significant (greater than 50%) stenosis. The visible vertebral arteries are patent bilaterally. Skeleton: No acute abnormality on limited assessment. Other neck: No acute abnormality on limited assessment. Upper chest: Visualized lung apices are clear. Review of the MIP images confirms the above findings CTA HEAD FINDINGS Anterior circulation: Bilateral intracranial ICAs are patent without significant stenosis. The proximal bilateral MCAs and ACAs are patent without proximal hemodynamically significant stenosis. Tortuous right M2 MCA branch loops back on itself, but appears to remain patent. No aneurysm identified. Posterior circulation: Bilateral intradural vertebral arteries, basilar artery, and posterior cerebral arteries are patent without proximal hemodynamically significant stenosis. Mild bilateral PCA stenosis. Small left P1 PCA with prominent left posterior communicating artery, anatomic variant. No aneurysm identified. Venous sinuses: As permitted by contrast timing, patent. Review of the MIP images confirms the above findings IMPRESSION: 1. New filling defect in the proximal right ICA in the neck, concerning for intraluminal clot and possible plaque rupture. 2. No large vessel occlusion or proximal hemodynamically significant stenosis in the head or neck. Findings discussed with Dr. Viviann Spare via telephone at 5:32 p.m. Electronically Signed   By: Feliberto Harts M.D.   On: 03/21/2021 17:57     PHYSICAL EXAM  Temp:  [97.6 F (36.4 C)-98.4 F (36.9 C)] 97.6 F  (36.4 C) (02/17 1919) Pulse Rate:  [69-99] 69 (02/17 1919) Resp:  [10-20] 18 (02/17 1919) BP: (107-135)/(73-100) 117/83 (02/17 1919) SpO2:  [92 %-100 %] 99 % (02/17 1919)  General - Well nourished, well developed, in no apparent distress.  Ophthalmologic - fundi not visualized due to noncooperation.  Cardiovascular - Regular rhythm and rate.  Mental Status -  Level of arousal and orientation to time, place, and person were intact. Language including expression, naming, repetition, comprehension was assessed and found intact. Fund of Knowledge was assessed and was intact.  Cranial Nerves II - XII - II - Visual field intact OU. III, IV, VI - Extraocular movements intact. V - Facial sensation intact bilaterally. VII - Facial movement intact bilaterally. VIII - Hearing & vestibular intact bilaterally. X - Palate elevates symmetrically. XI - Chin turning & shoulder shrug intact bilaterally. XII - Tongue protrusion intact.  Motor Strength - The patients strength was normal in all extremities and pronator drift was absent.  Bulk was normal and fasciculations were absent.   Motor Tone - Muscle tone was assessed at the neck and appendages and was normal.  Reflexes - The patients reflexes were symmetrical in all extremities and she had no pathological reflexes.  Sensory - Light touch, temperature/pinprick were assessed and were symmetrical.    Coordination - The patient had normal movements in the hands and feet with no ataxia or dysmetria.  Tremor was absent.  Gait and Station - deferred.   ASSESSMENT/PLAN [name] is a 41 y.o. female with history of diabetes, hypertension, smoker, obesity, stroke in 05/2020 admitted for right facial, arm and leg numbness with left hand numbness.   TIA:  bilateral hemisphere TIA due to b/l ICA injury/dissection with sneeze vs. Cardioembolic source CT head no acute abnormality, old right MCA infarct. CTA head and neck right ICA  thrombus, which is new MRI no acute infarct, Chiari I malformation MRA neck similar  right ICA thrombus as CTA neck Repeat CT no acute abnormality. 2D Echo EF 55 to 60% LE venous Doppler negative for DVT LDL 34 HgbA1c 5.2 UDS positive for THC Heparin IV for VTE prophylaxis No antithrombotic prior to admission, now on heparin IV.  Patient counseled to be compliant with her antithrombotic medications Ongoing aggressive stroke risk factor management Therapy recommendations: None Disposition: Pending  History of stroke 05/2020 admitted for left-sided weakness, numbness left facial droop, left neglect and right gaze.  Status post tPA.  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  Status post IR with TICI3.  MRI showed right MCA infarct and right MCA/ACA, MCA/PCA watershed infarcts.  EF 55 to 60%.  DVT negative.  TCD bubble study negative.  TEE showed positive bubble but negative color Doppler indicating small PFO.  ANA positive.  LDL 15 and A1c 9.7, patient discharged on aspirin and Crestor 20.  Diabetes HgbA1c 5.2 goal < 7.0 Controlled CBG monitoring SSI PCP follow up  Hypertension Stable Long term BP goal normotensive  Hyperlipidemia Home meds: Crestor 20 LDL 34, goal < 70 Now on Crestor 20 Continue statin at discharge  Other Stroke Risk Factors Cigarette smoker, advised to stop smoking Obesity, Body mass index is 35.84 kg/m.   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   Hospital day # 0  I spent extensive time in counseling and coordination of care, reviewing test results, images and medication, and discussing the diagnosis, treatment plan and potential prognosis. This patient's care requiresreview of multiple databases, neurological assessment, discussion with family, other specialists and medical decision making of high complexity.   Marvel PlanJindong Michaeljohn Biss, MD  PhD Stroke Neurology 03/22/2021 7:27 PM    To contact Stroke Continuity provider, please refer to [email or web address]. After hours, contact General Neurology

## 2021-03-22 NOTE — Progress Notes (Addendum)
Subjective: I seen and evaluated Christina [name] at bedside.  She was lying comfortably in bed.  She states that the numbness in her right arm and right side of face has improved.  She denies weakness or numbness of the right side of the body.  She denies slurring of speech  Objective:  Vital signs in last 24 hours: Vitals:   03/22/21 0100 03/22/21 0200 03/22/21 0309 03/22/21 0729  BP: 113/79 123/85 (!) 135/91 (!) 133/100  Pulse: 86 89 99 91  Resp: 12 18 16 14   Temp:   98.2 F (36.8 C) 98.2 F (36.8 C)  TempSrc:   Oral Oral  SpO2: 96% 97% 97% 100%  Weight:      Height:       Physical Exam Constitutional:      General: She is awake. She is not in acute distress. HENT:     Head: Normocephalic and atraumatic.  Cardiovascular:     Rate and Rhythm: Normal rate and regular rhythm.     Heart sounds: Normal heart sounds.  Pulmonary:     Effort: Pulmonary effort is normal.     Breath sounds: Normal breath sounds and air entry.  Musculoskeletal:     Right lower leg: No edema.     Left lower leg: No edema.  Skin:    General: Skin is warm and dry.  Neurological:     General: No focal deficit present.     Mental Status: She is alert and oriented to person, place, and time. Mental status is at baseline.     Cranial Nerves: No dysarthria or facial asymmetry.     Sensory: Sensation is intact.     Motor: No weakness.     Coordination: Finger-Nose-Finger Test normal.  Psychiatric:        Speech: Speech normal.        Behavior: Behavior is cooperative.        Cognition and Memory: Cognition normal.     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  Imaging work-up at that time negative for any significant findings.   Patient was started on daily aspirin and statin.  Patient endorsed discontinuing aspirin due  to noted heavier menses when used daily.  Endorsing bleeding through clothing and changing sanitary pads >10 times a day.  As opposed to normal cycle, changing sanitary pads 3-4 times a day.  Patient denies gynecological history such as fibroids or endometriosis.  Patient denies OCP use and underwent tubal ligation several years ago.  Patient denies family history of bleeding disorders or autoimmune conditions.  Patient denies longer bleeding times when injured.  Patient denies history of miscarriage. CTA obtained on admission revealed filling defect in the proximal right ICA concerning for intraluminal clot and possible plaque rupture; MRI brain is without acute infarction.  TSH within normal limits; vitamin B12 within normal limits; RPR negative; HIV nonreactive.  Echocardiogram reveals EF of 55 to 60% with no regional wall motion abnormalities.  Left ventricular diastolic consistent with grade 2 diastolic dysfunction.  No evidence of valvulopathy.  Preliminary results of lower extremity venous studies show no evidence of DVT in bilateral lower extremities.  On exam, sensation intact and 5 out of 5 strength in the upper and lower extremities.  No facial asymmetry or slurring of speech noted.  Patient states she feels at baseline.  Etiology of symptoms unclear at this time.  Pending further work-up, per neurology. --Neurology following; recommendations appreciated --Permissive hypertension --ASA 81 mg daily --Rosuvastatin 20 mg daily --Heparin gtt -- Autoimmune work-up pending  #Hypokalemia Labs were obtained today and critical lab of potassium level 2.4.  Patient was asymptomatic.  EKG was obtained and revealed no significant changes compared to prior EKG obtained on admission. Patient received potassium 10 mEq x4 runs. -- Potassium 40 mEq tablet twice daily today --Repeat potassium levels  Type 2 diabetes without complication Hemoglobin A1c obtained on admission is 5.2%.  Home medication includes  metformin 1000 mg daily, weekly Ozempic and daily Lantus. CGBs since admission have been acceptable. --Semglee 30 units daily at bedtime --Hold metformin --SSI --CBG monitoring  Asthma --Albuterol inhaler as needed   Prior to Admission Living Arrangement: Anticipated Discharge Location: Barriers to Discharge: Dispo: Anticipated discharge in approximately 1-2 day(s).   Dellis Filbert, MD 03/22/2021, 8:54 AM Pager: [phone] After 5pm on weekdays and 1pm on weekends: On Call pager [phone]

## 2021-03-22 NOTE — Progress Notes (Signed)
ANTICOAGULATION CONSULT NOTE - Initial Consult  Pharmacy Consult for IV heparin Indication:  ICA dissection  Allergies  Allergen Reactions   Peanut-Containing Drug Products Anaphylaxis   Lisinopril Other (See Comments)    Lip swelling   Penicillins Rash   Tomato Rash    Fresh tomatoes     Patient Measurements: Height: 5\' 7"  (170.2 cm) Weight: 103.8 kg (228 lb 13.4 oz) IBW/kg (Calculated) : 61.6 Heparin Dosing Weight: 85 kg  Vital Signs: Temp: 98.2 F (36.8 C) (02/17 0729) Temp Source: Oral (02/17 0729) BP: 133/100 (02/17 0729) Pulse Rate: 91 (02/17 0729)  Labs: Recent Labs    03/21/21 1718 03/21/21 1723 03/22/21 0833  HGB 14.2 15.3* 13.6  HCT 43.7 45.0 40.0  PLT 303  --  306  APTT 29  --   --   LABPROT 14.6  --   --   INR 1.1  --   --   HEPARINUNFRC  --   --  0.18*  CREATININE 1.03* 1.10* 0.90     Estimated Creatinine Clearance: 105 mL/min (by C-G formula based on SCr of 0.9 mg/dL).   Medical History: Past Medical History:  Diagnosis Date   Asthma    Diabetes mellitus without complication (HCC)    Hypertension    Stroke [facility])     Assessment: 41 YO female admitted for code stroke. 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 TNKase was NOT administered. CBC stable, H/H 13.6/40.   Heparin paused overnight for CT head. Patient did not receive bolus, and heparin was restarted soon after the CT of the head and not expected to drastically impact heparin level.   Heparin level completed at 0830 with a heparin level of 0.18. Based on this level it is necessary to increase her dose 2u/kg based on dosing weight of 85kg. CBC nonsignificant, no notes of patient bleeding.  Goal of Therapy:  Heparin level 0.3-0.5 units/ml Monitor platelets by anticoagulation protocol: Yes   Plan:  Increase heparin gtt to 1200 units/hr. F/up 6h heparin level Monitor CBC, HL, and s/sx of bleeding daily   [birth year]16-1985 - Pharmacy Student 03/22/2021 10:21  AM

## 2021-03-22 NOTE — ED Notes (Signed)
Paused heparin drip per neurology order

## 2021-03-22 NOTE — Progress Notes (Signed)
Brief Neuro Update:  Was notife of new onset slurred speech and return of R sided numbness. This happened about 10 mins ago. Asked RN to pause heparin gtt. We will get a CTH w/o contrast on her.  [place] Pager Number [phone]

## 2021-03-22 NOTE — Progress Notes (Signed)
Bilateral lower extremity venous duplex completed. Refer to "CV Proc" under chart review to view preliminary results.  03/22/2021 11:44 AM Kelby Aline., MHA, RVT, RDCS, RDMS

## 2021-03-22 NOTE — ED Notes (Signed)
CT called for stat CT head, no scanner available at this time

## 2021-03-22 NOTE — ED Notes (Signed)
ED TO INPATIENT HANDOFF REPORT  ED Nurse Name and Phone #: Mel Almond 29  S Name/Age/Gender Christina [name] 41 y.o. female Room/Bed: 039C/039C  Code Status   Code Status: Full Code  Home/SNF/Other Home Patient oriented to: self, place, time, and situation Is this baseline? Yes   Triage Complete: Triage complete  Chief Complaint CVA (cerebral vascular accident) [facility]) [I63.9]  Triage Note LKW 1600, at work noted loss of coordination, right facialoop and numbness right side face/arm and thigh. Hx recent stroke.    Allergies Allergies  Allergen Reactions   Peanut-Containing Drug Products Anaphylaxis   Lisinopril Other (See Comments)    Lip swelling   Penicillins Rash   Tomato Rash    Fresh tomatoes     Level of Care/Admitting Diagnosis ED Disposition     ED Disposition  Admit   Condition  --   [place]: [place] [[id number]]  Level of Care: Telemetry Medical [104]  May place patient in observation at [facility] or [place] if equivalent level of care is available:: No  Covid Evaluation: Asymptomatic Screening Protocol (No Symptoms)  Diagnosis: CVA (cerebral vascular accident) [facility]) [id number]  Admitting Physician: Velna Ochs [id number]  Attending Physician: Velna Ochs [id number]          B Medical/Surgery History Past Medical History:  Diagnosis Date   Asthma    Diabetes mellitus without complication ([place])    Hypertension    Stroke [facility])    Past Surgical History:  Procedure Laterality Date   BUBBLE STUDY  05/29/2020   Procedure: BUBBLE STUDY;  Surgeon: Donato Heinz, MD;  Location: [place];  Service: Cardiovascular;;   IR CT HEAD LTD  05/27/2020   IR PERCUTANEOUS ART THROMBECTOMY/INFUSION INTRACRANIAL INC DIAG ANGIO  05/27/2020   RADIOLOGY WITH ANESTHESIA N/A 05/27/2020   Procedure: IR WITH ANESTHESIA;  Surgeon: Radiologist, Medication, MD;  Location: [place];  Service: Radiology;  Laterality: N/A;    TEE WITHOUT CARDIOVERSION N/A 05/29/2020   Procedure: TRANSESOPHAGEAL ECHOCARDIOGRAM (TEE);  Surgeon: Donato Heinz, MD;  Location: [facility] ENDOSCOPY;  Service: Cardiovascular;  Laterality: N/A;   TUBAL LIGATION       A IV Location/Drains/Wounds Patient Lines/Drains/Airways Status     Active Line/Drains/Airways     Name Placement date Placement time Site Days   Peripheral IV 03/21/21 18 G Left Antecubital 03/21/21  --  Antecubital  1   Peripheral IV 03/21/21 20 G 1" Anterior;Right Forearm 03/21/21  2327  Forearm  1   Incision (Closed) 05/27/20 Groin Anterior;Proximal;Right 05/27/20  1316  -- 299            Intake/Output Last 24 hours No intake or output data in the 24 hours ending 03/22/21 0234  Labs/Imaging Results for orders placed or performed during the hospital encounter of 03/21/21 (from the past 48 hour(s))  Urine rapid drug screen (hosp performed)     Status: Abnormal   Collection Time: 03/21/21 12:01 AM  Result Value Ref Range   Opiates NONE DETECTED NONE DETECTED   Cocaine NONE DETECTED NONE DETECTED   Benzodiazepines NONE DETECTED NONE DETECTED   Amphetamines NONE DETECTED NONE DETECTED   Tetrahydrocannabinol POSITIVE (A) NONE DETECTED   Barbiturates NONE DETECTED NONE DETECTED    Comment: (NOTE) DRUG SCREEN FOR MEDICAL PURPOSES ONLY.  IF CONFIRMATION IS NEEDED FOR ANY PURPOSE, NOTIFY LAB WITHIN 5 DAYS.  LOWEST DETECTABLE LIMITS FOR URINE DRUG SCREEN Drug Class  Cutoff (ng/mL) Amphetamine and metabolites    1000 Barbiturate and metabolites    200 Benzodiazepine                 [id number] Tricyclics and metabolites     300 Opiates and metabolites        300 Cocaine and metabolites        300 THC                            50 Performed at [facility], [place] [address] [place], [place] [address]   Urinalysis, Routine w reflex microscopic Urine, Clean Catch     Status: Abnormal   Collection Time: 03/21/21 12:01 AM  Result Value  Ref Range   Color, Urine YELLOW YELLOW   APPearance CLEAR CLEAR   Specific Gravity, Urine 1.040 (H) 1.005 - 1.030   pH 7.0 5.0 - 8.0   Glucose, UA NEGATIVE NEGATIVE mg/dL   Hgb urine dipstick NEGATIVE NEGATIVE   Bilirubin Urine NEGATIVE NEGATIVE   Ketones, ur NEGATIVE NEGATIVE mg/dL   Protein, ur NEGATIVE NEGATIVE mg/dL   Nitrite NEGATIVE NEGATIVE   Leukocytes,Ua NEGATIVE NEGATIVE    Comment: Performed at [facility] [address], [place], [place] [address]  Resp Panel by RT-PCR (Flu A&B, Covid) Nasopharyngeal Swab     Status: None   Collection Time: 03/21/21  5:09 PM   Specimen: Nasopharyngeal Swab; Nasopharyngeal(NP) swabs in vial transport medium  Result Value Ref Range   SARS Coronavirus 2 by RT PCR NEGATIVE NEGATIVE    Comment: (NOTE) SARS-CoV-2 target nucleic acids are NOT DETECTED.  The SARS-CoV-2 RNA is generally detectable in upper respiratory specimens during the acute phase of infection. The lowest concentration of SARS-CoV-2 viral copies this assay can detect is 138 copies/mL. A negative result does not preclude SARS-Cov-2 infection and should not be used as the sole basis for treatment or other patient management decisions. A negative result may occur with  improper specimen collection/handling, submission of specimen other than nasopharyngeal swab, presence of viral mutation(s) within the areas targeted by this assay, and inadequate number of viral copies(<138 copies/mL). A negative result must be combined with clinical observations, patient history, and epidemiological information. The expected result is Negative.  Fact Sheet for Patients:  EntrepreneurPulse.com.au  Fact Sheet for Healthcare Providers:  IncredibleEmployment.be  This test is no t yet approved or cleared by the [place] FDA and  has been authorized for detection and/or diagnosis of SARS-CoV-2 by FDA under an Emergency Use Authorization (EUA).  This EUA will remain  in effect (meaning this test can be used) for the duration of the COVID-19 declaration under Section 564(b)(1) of the Act, 21 U.S.C.section 360bbb-3(b)(1), unless the authorization is terminated  or revoked sooner.       Influenza A by PCR NEGATIVE NEGATIVE   Influenza B by PCR NEGATIVE NEGATIVE    Comment: (NOTE) The Xpert Xpress SARS-CoV-2/FLU/RSV plus assay is intended as an aid in the diagnosis of influenza from Nasopharyngeal swab specimens and should not be used as a sole basis for treatment. Nasal washings and aspirates are unacceptable for Xpert Xpress SARS-CoV-2/FLU/RSV testing.  Fact Sheet for Patients: EntrepreneurPulse.com.au  Fact Sheet for Healthcare Providers: IncredibleEmployment.be  This test is not yet approved or cleared by the [place] FDA and has been authorized for detection and/or diagnosis of SARS-CoV-2 by FDA under an Emergency Use Authorization (EUA). This EUA will remain in  effect (meaning this test can be used) for the duration of the COVID-19 declaration under Section 564(b)(1) of the Act, 21 U.S.C. section 360bbb-3(b)(1), unless the authorization is terminated or revoked.  Performed at [facility], [place] [address] [place], [place] [address]   CBG monitoring, ED     Status: Abnormal   Collection Time: 03/21/21  5:12 PM  Result Value Ref Range   Glucose-Capillary 151 (H) 70 - 99 mg/dL    Comment: Glucose reference range applies only to samples taken after fasting for at least 8 hours.  Ethanol     Status: None   Collection Time: 03/21/21  5:18 PM  Result Value Ref Range   Alcohol, Ethyl (B) <10 <10 mg/dL    Comment: (NOTE) Lowest detectable limit for serum alcohol is 10 mg/dL.  For medical purposes only. Performed at [facility], [place] [address], [place], [place] [address]   Protime-INR     Status: None   Collection Time: 03/21/21  5:18 PM  Result Value Ref Range    Prothrombin Time 14.6 11.4 - 15.2 seconds   INR 1.1 0.8 - 1.2    Comment: (NOTE) INR goal varies based on device and disease states. Performed at [facility], [place] [address], [place], [place] [address]   APTT     Status: None   Collection Time: 03/21/21  5:18 PM  Result Value Ref Range   aPTT 29 24 - 36 seconds    Comment: Performed at [facility] [address] [place], [place] [address]  CBC     Status: Abnormal   Collection Time: 03/21/21  5:18 PM  Result Value Ref Range   WBC 3.8 (L) 4.0 - 10.5 K/uL   RBC 4.63 3.87 - 5.11 MIL/uL   Hemoglobin 14.2 12.0 - 15.0 g/dL   HCT 43.7 36.0 - 46.0 %   MCV 94.4 80.0 - 100.0 fL   MCH 30.7 26.0 - 34.0 pg   MCHC 32.5 30.0 - 36.0 g/dL   RDW 11.8 11.5 - 15.5 %   Platelets 303 150 - 400 K/uL   nRBC 0.0 0.0 - 0.2 %    Comment: Performed at [facility], [place] [address] [place], [place] [address]  Differential     Status: Abnormal   Collection Time: 03/21/21  5:18 PM  Result Value Ref Range   Neutrophils Relative % 40 %   Neutro Abs 1.5 (L) 1.7 - 7.7 K/uL   Lymphocytes Relative 34 %   Lymphs Abs 1.3 0.7 - 4.0 K/uL   Monocytes Relative 13 %   Monocytes Absolute 0.5 0.1 - 1.0 K/uL   Eosinophils Relative 11 %   Eosinophils Absolute 0.4 0.0 - 0.5 K/uL   Basophils Relative 2 %   Basophils Absolute 0.1 0.0 - 0.1 K/uL   Immature Granulocytes 0 %   Abs Immature Granulocytes 0.01 0.00 - 0.07 K/uL    Comment: Performed at [facility] [address], [place], [place] [address]  Comprehensive metabolic panel     Status: Abnormal   Collection Time: 03/21/21  5:18 PM  Result Value Ref Range   Sodium 134 (L) 135 - 145 mmol/L   Potassium 3.5 3.5 - 5.1 mmol/L   Chloride 94 (L) 98 - 111 mmol/L   CO2 29 22 - 32 mmol/L   Glucose, Bld 157 (H) 70 - 99 mg/dL    Comment: Glucose reference range applies only to samples taken  after fasting for at least 8 hours.   BUN 12 6 - 20 mg/dL   Creatinine, Ser 1.03 (H) 0.44 - 1.00  mg/dL   Calcium 8.9 8.9 - 10.3 mg/dL   Total Protein 7.9 6.5 - 8.1 g/dL   Albumin 3.8 3.5 - 5.0 g/dL   AST 21 15 - 41 U/L   ALT 16 0 - 44 U/L   Alkaline Phosphatase 43 38 - 126 U/L   Total Bilirubin 1.2 0.3 - 1.2 mg/dL   GFR, Estimated >60 >60 mL/min    Comment: (NOTE) Calculated using the CKD-EPI Creatinine Equation (2021)    Anion gap 11 5 - 15    Comment: Performed at [facility] [address] [place], [place] [address]  I-Stat beta hCG blood, ED     Status: None   Collection Time: 03/21/21  5:20 PM  Result Value Ref Range   I-stat hCG, quantitative <5.0 <5 mIU/mL   Comment 3            Comment:   GEST. AGE      CONC.  (mIU/mL)   <=1 WEEK        5 - 50     2 WEEKS       50 - 500     3 WEEKS       100 - 10,000     4 WEEKS     1,000 - 30,000        FEMALE AND NON-PREGNANT FEMALE:     LESS THAN 5 mIU/mL   I-stat chem 8, ED     Status: Abnormal   Collection Time: 03/21/21  5:23 PM  Result Value Ref Range   Sodium 136 135 - 145 mmol/L   Potassium 3.5 3.5 - 5.1 mmol/L   Chloride 92 (L) 98 - 111 mmol/L   BUN 14 6 - 20 mg/dL   Creatinine, Ser 1.10 (H) 0.44 - 1.00 mg/dL   Glucose, Bld 154 (H) 70 - 99 mg/dL    Comment: Glucose reference range applies only to samples taken after fasting for at least 8 hours.   Calcium, Ion 1.07 (L) 1.15 - 1.40 mmol/L   TCO2 32 22 - 32 mmol/L   Hemoglobin 15.3 (H) 12.0 - 15.0 g/dL   HCT 45.0 36.0 - 46.0 %  CBG monitoring, ED     Status: None   Collection Time: 03/21/21 11:20 PM  Result Value Ref Range   Glucose-Capillary 96 70 - 99 mg/dL    Comment: Glucose reference range applies only to samples taken after fasting for at least 8 hours.  Hemoglobin A1c     Status: None   Collection Time: 03/21/21 11:55 PM  Result Value Ref Range   Hgb A1c MFr Bld 5.2 4.8 - 5.6 %    Comment: (NOTE) Pre diabetes:          5.7%-6.4%  Diabetes:              >6.4%  Glycemic control for   <7.0% adults with diabetes    Mean Plasma Glucose 102.54  mg/dL    Comment: Performed at [facility] [address] [place], [place] [address]  Phosphorus     Status: None   Collection Time: 03/21/21 11:58 PM  Result Value Ref Range   Phosphorus 2.6 2.5 - 4.6 mg/dL    Comment: Performed at [facility] [address] [place], [place] [address]  Magnesium  Status: Abnormal   Collection Time: 03/21/21 11:58 PM  Result Value Ref Range   Magnesium 1.5 (L) 1.7 - 2.4 mg/dL    Comment: Performed at [facility] [address] [place], [place] [address]  Lipid panel     Status: Abnormal   Collection Time: 03/21/21 11:58 PM  Result Value Ref Range   Cholesterol 75 0 - 200 mg/dL   Triglycerides 69 <150 mg/dL   HDL 27 (L) >40 mg/dL   Total CHOL/HDL Ratio 2.8 RATIO   VLDL 14 0 - 40 mg/dL   LDL Cholesterol 34 0 - 99 mg/dL    Comment:        Total Cholesterol/HDL:CHD Risk Coronary Heart Disease Risk Table                     Men   Women  1/2 Average Risk   3.4   3.3  Average Risk       5.0   4.4  2 X Average Risk   9.6   7.1  3 X Average Risk  23.4   11.0        Use the calculated Patient Ratio above and the CHD Risk Table to determine the patient's CHD Risk.        ATP III CLASSIFICATION (LDL):  <100     mg/dL   Optimal  100-129  mg/dL   Near or Above                    Optimal  130-159  mg/dL   Borderline  160-189  mg/dL   High  >190     mg/dL   Very High Performed at [facility] [address] [place], [place] [address]    CT HEAD WO CONTRAST (5MM)  Result Date: 03/22/2021 CLINICAL DATA:  Code stroke follow-up EXAM: CT HEAD WITHOUT CONTRAST TECHNIQUE: Contiguous axial images were obtained from the base of the skull through the vertex without intravenous contrast. RADIATION DOSE REDUCTION: This exam was performed according to the departmental dose-optimization program which includes automated exposure control, adjustment of the mA and/or kV according to patient size and/or use of iterative  reconstruction technique. COMPARISON:  03/21/2021 FINDINGS: Brain: No acute hemorrhage. There is periventricular hypoattenuation compatible with chronic microvascular disease. Unchanged appearance of old right MCA territory infarcts. Vascular: No abnormal hyperdensity of the major intracranial arteries or dural venous sinuses. No intracranial atherosclerosis. Skull: The visualized skull base, calvarium and extracranial soft tissues are normal. Sinuses/Orbits: No fluid levels or advanced mucosal thickening of the visualized paranasal sinuses. No mastoid or middle ear effusion. The orbits are normal. IMPRESSION: 1. No acute intracranial abnormality. 2. Old right MCA territory infarcts and findings of chronic microvascular ischemia. Electronically Signed   By: Ulyses Jarred M.D.   On: 03/22/2021 01:07   MR ANGIO NECK W WO CONTRAST  Result Date: 03/21/2021 CLINICAL DATA:  Abnormal CTA with filling defect right ICA EXAM: MRA NECK WITHOUT AND WITH CONTRAST TECHNIQUE: Multiplanar and multiecho pulse sequences of the neck were obtained without and with intravenous contrast. Angiographic images of the neck were obtained using MRA technique without and with intravenous contrast. CONTRAST:  49mL GADAVIST GADOBUTROL 1 MMOL/ML IV SOLN COMPARISON:  CTA head and neck 03/21/2021 FINDINGS: MRI degraded by motion. MRA confirms a filling defect in the proximal right ICA causing mild narrowing of the lumen. Remainder of the right internal carotid artery normal Left carotid bifurcation normal without stenosis  or irregularity Both vertebral arteries normal and widely patent. Antegrade flow in the carotid and vertebral arteries bilaterally. IMPRESSION: Filling defect in the proximal right ICA causing mild luminal stenosis. This confirms the finding on CT angiogram. This could be due to underlying plaque rupture or dissection. These results were called by telephone at the time of interpretation on 03/21/2021 at 6:55 pm to provider  Palikh, who verbally acknowledged these results. Electronically Signed   By: Franchot Gallo M.D.   On: 03/21/2021 18:56   MR BRAIN WO CONTRAST  Result Date: 03/21/2021 CLINICAL DATA:  Neuro deficit, acute, stroke suspected EXAM: MRI HEAD WITHOUT CONTRAST TECHNIQUE: Multiplanar, multiecho pulse sequences of the brain and surrounding structures were obtained without intravenous contrast. COMPARISON:  MRI May 28, 2020. FINDINGS: Brain: No acute infarction, hemorrhage, hydrocephalus, extra-axial collection or mass lesion. Remote right MCA territory infarcts involving the insula and overlying operculum. A couple small T2/FLAIR hyperintensities the white matter, nonspecific but both chronic microvascular disease. As before, the cerebellar tonsils extend 5-6 mm below the level of the foramen magnum Vascular: Major arterial flow voids are maintained at the skull base. Further evaluated on same day CTA. Skull and upper cervical spine: Normal marrow signal. Sinuses/Orbits: Mild paranasal sinus mucosal thickening with opacified left posterior ethmoid air cell and bilateral inferior maxillary sinus retention cysts. Other: No mastoid effusions. IMPRESSION: 1. No evidence of acute infarct. 2. Remote right MCA territory infarcts. 3. As before, the cerebellar tonsils extend 5-6 mm below the level of the foramen magnum with mild crowding at the level of the foramen magnum. 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 As noted on the prior, cerebellar tonsillar ectopia may be seen in the setting of idiopathic intracranial hypertension or intracranial hypotension. There is a partially empty sella turcica, which can also be seen with intracranial hypertension. Electronically Signed   By: Margaretha Sheffield M.D.   On: 03/21/2021 18:12   DG CHEST PORT 1 VIEW  Result Date: 03/21/2021 CLINICAL DATA:  Cough EXAM: PORTABLE CHEST 1 VIEW COMPARISON:  09/16/2019 FINDINGS: The heart size and mediastinal contours are within normal  limits. Both lungs are clear. The visualized skeletal structures are unremarkable. IMPRESSION: No active disease. Electronically Signed   By: Randa Ngo M.D.   On: 03/21/2021 20:25   CT HEAD CODE STROKE WO CONTRAST  Result Date: 03/21/2021 CLINICAL DATA:  Code stroke.  Neuro deficit, acute, stroke suspected EXAM: CT HEAD WITHOUT CONTRAST TECHNIQUE: Contiguous axial images were obtained from the base of the skull through the vertex without intravenous contrast. RADIATION DOSE REDUCTION: This exam was performed according to the departmental dose-optimization program which includes automated exposure control, adjustment of the mA and/or kV according to patient size and/or use of iterative reconstruction technique. COMPARISON:  CT May 27, 2020.  MRI May 28, 2020. FINDINGS: Brain: Remote infarcts in the right frontal cortex, insula and temporal lobe. No evidence of acute large vascular territory infarct, acute hemorrhage, mass lesion, hydrocephalus, midline shift. Streak artifact limits evaluation. Partially empty sella. Similar inferior tonsillar descent. Vascular: No definite hyperdense vessel identified. Streak artifact limits evaluation. Skull: No acute fracture. Sinuses/Orbits: Opacification of scattered ethmoid air cells and inferior maxillary sinus retention cysts. No acute orbital findings. Other: No mastoid effusions. ASPECTS [facility] Stroke Program Early CT Score) total score (0-10 with 10 being normal): 10. IMPRESSION: 1. No evidence of acute large vascular territory infarct or acute hemorrhage. ASPECTS is 10. 2. Remote right MCA territory infarcts. 3. Similar inferior cerebellar tonsillar descent.  Code stroke imaging results were communicated on 03/21/2021 at 5:29 pm to provider Palikh via secure text paging. Electronically Signed   By: Margaretha Sheffield M.D.   On: 03/21/2021 17:30   CT ANGIO HEAD NECK W WO CM (CODE STROKE)  Result Date: 03/21/2021 CLINICAL DATA:  Neuro deficit, acute, stroke  suspected EXAM: CT ANGIOGRAPHY HEAD AND NECK TECHNIQUE: Multidetector CT imaging of the head and neck was performed using the standard protocol during bolus administration of intravenous contrast. Multiplanar CT image reconstructions and MIPs were obtained to evaluate the vascular anatomy. Carotid stenosis measurements (when applicable) are obtained utilizing NASCET criteria, using the distal internal carotid diameter as the denominator. RADIATION DOSE REDUCTION: This exam was performed according to the departmental dose-optimization program which includes automated exposure control, adjustment of the mA and/or kV according to patient size and/or use of iterative reconstruction technique. CONTRAST:  60mL OMNIPAQUE IOHEXOL 350 MG/ML SOLN COMPARISON:  CTA May 27, 2020. FINDINGS: CTA NECK FINDINGS Aortic arch: Great vessel origins are patent. Right carotid system: Common carotid artery is patent without significant stenosis. There is eccentric intraluminal filling defect within the proximal right ICA, which is not apparent on the comparison prior CTA. This filling defect also appears to extend into the lumen more superiorly. No significant (greater than 50%) stenosis. Tortuous ICA. Left carotid system: No evidence of dissection, stenosis (50% or greater) or occlusion. Retropharyngeal course. Tortuous ICA. Vertebral arteries: Mildly right dominant. Proximal left vertebral artery is obscured by streak artifact from pooled venous contrast. Within this limitation, no visible significant (greater than 50%) stenosis. The visible vertebral arteries are patent bilaterally. Skeleton: No acute abnormality on limited assessment. Other neck: No acute abnormality on limited assessment. Upper chest: Visualized lung apices are clear. Review of the MIP images confirms the above findings CTA HEAD FINDINGS Anterior circulation: Bilateral intracranial ICAs are patent without significant stenosis. The proximal bilateral MCAs and ACAs  are patent without proximal hemodynamically significant stenosis. Tortuous right M2 MCA branch loops back on itself, but appears to remain patent. No aneurysm identified. Posterior circulation: Bilateral intradural vertebral arteries, basilar artery, and posterior cerebral arteries are patent without proximal hemodynamically significant stenosis. Mild bilateral PCA stenosis. Small left P1 PCA with prominent left posterior communicating artery, anatomic variant. No aneurysm identified. Venous sinuses: As permitted by contrast timing, patent. Review of the MIP images confirms the above findings IMPRESSION: 1. New filling defect in the proximal right ICA in the neck, concerning for intraluminal clot and possible plaque rupture. 2. No large vessel occlusion or proximal hemodynamically significant stenosis in the head or neck. Findings discussed with Dr. Reeves Forth via telephone at 5:32 p.m. Electronically Signed   By: Margaretha Sheffield M.D.   On: 03/21/2021 17:57    Pending Labs Unresulted Labs (From admission, onward)     Start     Ordered   03/22/21 0500  Heparin level (unfractionated)  Daily,   R     See Hyperspace for full Linked Orders Report.   03/21/21 1940   03/22/21 0500  CBC  Daily,   R     See Hyperspace for full Linked Orders Report.   03/21/21 1940   03/22/21 0500  Comprehensive metabolic panel  Tomorrow morning,   R        03/21/21 1953   03/22/21 0200  Heparin level (unfractionated)  Once-Timed,   STAT        03/21/21 1940   03/22/21 0143  TSH  Once,   R  03/22/21 0142   03/22/21 0143  Vitamin B12  Once,   R        03/22/21 0142   03/22/21 0143  RPR  Once,   R        03/22/21 0142   03/22/21 0143  Rapid HIV screen (HIV 1/2 Ab+Ag)  Once,   R        03/22/21 0142   03/22/21 0142  Homocysteine, serum  (Hypercoagulable Panel, Comprehensive ([facility]))  Once,   R        03/22/21 0142   03/22/21 0142  Phosphatidylserine antibodies  Once,   R        03/22/21 0142   03/22/21 0141  ANA,  IFA (with reflex)  Once,   R        03/22/21 0142   03/22/21 0141  ANCA Titers  (Anti-Neutrophilic Cystoplasmic Antibody Panel ([facility]))  Once,   R        03/22/21 0142   03/22/21 0141  Beta-2-glycoprotein i abs, IgG/M/A  (Hypercoagulable Panel, Comprehensive ([facility]))  Once,   R        03/22/21 0142            Vitals/Pain Today's Vitals   03/21/21 2347 03/22/21 0000 03/22/21 0100 03/22/21 0200  BP: 107/73 125/87 113/79 123/85  Pulse: 81 87 86 89  Resp: 16 15 12 18   Temp:      TempSrc:      SpO2: 92% 100% 96% 97%  Weight:      Height:      PainSc:        Isolation Precautions No active isolations  Medications Medications  heparin ADULT infusion 100 units/mL (25000 units/278mL) (1,000 Units/hr Intravenous Restarted 03/22/21 0111)  sodium chloride flush (NS) 0.9 % injection 3 mL (3 mLs Intravenous Not Given 03/21/21 2110)  acetaminophen (TYLENOL) tablet 650 mg (has no administration in time range)    Or  acetaminophen (TYLENOL) suppository 650 mg (has no administration in time range)  polyethylene glycol (MIRALAX / GLYCOLAX) packet 17 g (has no administration in time range)  ondansetron (ZOFRAN) tablet 4 mg (has no administration in time range)    Or  ondansetron (ZOFRAN) injection 4 mg (has no administration in time range)  insulin aspart (novoLOG) injection 0-9 Units (has no administration in time range)  insulin glargine-yfgn (SEMGLEE) injection 30 Units (has no administration in time range)  aspirin EC tablet 81 mg (has no administration in time range)  albuterol (PROVENTIL) (2.5 MG/3ML) 0.083% nebulizer solution 3 mL (has no administration in time range)  rosuvastatin (CRESTOR) tablet 20 mg (has no administration in time range)  iohexol (OMNIPAQUE) 350 MG/ML injection 60 mL (60 mLs Intravenous Contrast Given 03/21/21 1723)  gadobutrol (GADAVIST) 1 MMOL/ML injection 10 mL (10 mLs Intravenous Contrast Given 03/21/21 1847)  aspirin tablet 325 mg (325 mg Oral Given 03/21/21 2317)     Mobility walks with person assist Low fall risk   Focused Assessments Neuro Assessment Handoff:  Swallow screen pass? Yes  Cardiac Rhythm: Normal sinus rhythm NIH Stroke Scale ( + Modified Stroke Scale Criteria)  Interval: (S) Neuro change Level of Consciousness (1a.)   : Alert, keenly responsive LOC Questions (1b. )   +: Answers both questions correctly LOC Commands (1c. )   + : Performs both tasks correctly Best Gaze (2. )  +: Normal Visual (3. )  +: No visual loss Facial Palsy (4. )    : Normal symmetrical movements Motor Arm, Left (5a. )   +:  No drift Motor Arm, Right (5b. )   +: No drift Motor Leg, Left (6a. )   +: No drift Motor Leg, Right (6b. )   +: No drift Limb Ataxia (7. ): Absent Sensory (8. )   +: Mild-to-moderate sensory loss, patient feels pinprick is less sharp or is dull on the affected side, or there is a loss of superficial pain with pinprick, but patient is aware of being touched Best Language (9. )   +: No aphasia Dysarthria (10. ): Normal Extinction/Inattention (11.)   +: No Abnormality Modified SS Total  +: 1 Complete NIHSS TOTAL: 1 Last date known well: 03/21/21 Last time known well: 1600 Neuro Assessment: Within Defined Limits Neuro Checks:   Initial (03/21/21 1715)  Last Documented NIHSS Modified Score: 1 (03/22/21 0056) Has TPA been given? No If patient is a Neuro Trauma and patient is going to OR before floor call report to [place] nurse: [phone] or [phone]   R Recommendations: See Admitting Provider Note  Report given to:   Additional Notes: -

## 2021-03-22 NOTE — ED Notes (Signed)
Pt returned to room, slurred speech improving

## 2021-03-22 NOTE — Progress Notes (Signed)
°  Transition of Care [facility]) Screening Note   Patient Details  Name: Christina [name] Date of Birth: 12-30-1980   Transition of Care [facility]) CM/SW Contact:    Pollie Friar, RN Phone Number: 03/22/2021, 2:54 PM    Transition of Care Department [facility]) has reviewed patient and no TOC needs have been identified at this time. We will continue to monitor patient advancement through interdisciplinary progression rounds. If new patient transition needs arise, please place a TOC consult.

## 2021-03-23 DIAGNOSIS — Z8673 Personal history of transient ischemic attack (TIA), and cerebral infarction without residual deficits: Secondary | ICD-10-CM

## 2021-03-23 DIAGNOSIS — G459 Transient cerebral ischemic attack, unspecified: Secondary | ICD-10-CM | POA: Diagnosis not present

## 2021-03-23 LAB — CBC WITH DIFFERENTIAL/PLATELET
Abs Immature Granulocytes: 0.01 10*3/uL (ref 0.00–0.07)
Basophils Absolute: 0 10*3/uL (ref 0.0–0.1)
Basophils Relative: 1 %
Eosinophils Absolute: 0.3 10*3/uL (ref 0.0–0.5)
Eosinophils Relative: 6 %
HCT: 37.8 % (ref 36.0–46.0)
Hemoglobin: 12.8 g/dL (ref 12.0–15.0)
Immature Granulocytes: 0 %
Lymphocytes Relative: 60 %
Lymphs Abs: 2.7 10*3/uL (ref 0.7–4.0)
MCH: 31.1 pg (ref 26.0–34.0)
MCHC: 33.9 g/dL (ref 30.0–36.0)
MCV: 91.7 fL (ref 80.0–100.0)
Monocytes Absolute: 0.4 10*3/uL (ref 0.1–1.0)
Monocytes Relative: 10 %
Neutro Abs: 1 10*3/uL — ABNORMAL LOW (ref 1.7–7.7)
Neutrophils Relative %: 23 %
Platelets: 292 10*3/uL (ref 150–400)
RBC: 4.12 MIL/uL (ref 3.87–5.11)
RDW: 11.7 % (ref 11.5–15.5)
WBC: 4.5 10*3/uL (ref 4.0–10.5)
nRBC: 0 % (ref 0.0–0.2)

## 2021-03-23 LAB — BASIC METABOLIC PANEL
Anion gap: 8 (ref 5–15)
BUN: 12 mg/dL (ref 6–20)
CO2: 27 mmol/L (ref 22–32)
Calcium: 8.2 mg/dL — ABNORMAL LOW (ref 8.9–10.3)
Chloride: 99 mmol/L (ref 98–111)
Creatinine, Ser: 0.81 mg/dL (ref 0.44–1.00)
GFR, Estimated: >60 mL/min (ref >60)
Glucose, Bld: 117 mg/dL — ABNORMAL HIGH (ref 70–99)
Potassium: 2.9 mmol/L — ABNORMAL LOW (ref 3.5–5.1)
Sodium: 134 mmol/L — ABNORMAL LOW (ref 135–145)

## 2021-03-23 LAB — GLUCOSE, CAPILLARY
Glucose-Capillary: 87 mg/dL (ref 70–99)
Glucose-Capillary: 113 mg/dL — ABNORMAL HIGH (ref 70–99)
Glucose-Capillary: 116 mg/dL — ABNORMAL HIGH (ref 70–99)
Glucose-Capillary: 135 mg/dL — ABNORMAL HIGH (ref 70–99)

## 2021-03-23 LAB — NA AND K (SODIUM & POTASSIUM), RAND UR
Potassium Urine: 36 mmol/L
Sodium, Ur: 186 mmol/L

## 2021-03-23 LAB — HEPARIN LEVEL (UNFRACTIONATED)
Heparin Unfractionated: 0.42 IU/mL (ref 0.30–0.70)
Heparin Unfractionated: 0.41 IU/mL (ref 0.30–0.70)

## 2021-03-23 LAB — POTASSIUM: Potassium: 3.7 mmol/L (ref 3.5–5.1)

## 2021-03-23 LAB — CREATININE, URINE, RANDOM: Creatinine, Urine: 153.36 mg/dL

## 2021-03-23 LAB — MAGNESIUM: Magnesium: 1.9 mg/dL (ref 1.7–2.4)

## 2021-03-23 LAB — HOMOCYSTEINE: Homocysteine: 9.2 umol/L (ref 0.0–14.5)

## 2021-03-23 MED ORDER — POTASSIUM CHLORIDE CRYS ER 20 MEQ PO TBCR
40.0000 meq | EXTENDED_RELEASE_TABLET | Freq: Two times a day (BID) | ORAL | Status: AC
  Administered 2021-03-23 (×2): 40 meq via ORAL
  Filled 2021-03-23 (×2): qty 2

## 2021-03-23 NOTE — Progress Notes (Signed)
ANTICOAGULATION CONSULT NOTE - follow-up  Pharmacy Consult for IV heparin Indication:  ICA dissection  Allergies  Allergen Reactions   Peanut-Containing Drug Products Anaphylaxis   Lisinopril Other (See Comments)    Lip swelling   Penicillins Rash   Tomato Rash    Fresh tomatoes     Patient Measurements: Height: 5\' 7"  (170.2 cm) Weight: 103.8 kg (228 lb 13.4 oz) IBW/kg (Calculated) : 61.6 Heparin Dosing Weight: 85 kg  Vital Signs: Temp: 98.1 F (36.7 C) (02/17 2341) Temp Source: Oral (02/17 2341) BP: 129/87 (02/17 2341) Pulse Rate: 68 (02/17 2341)  Labs: Recent Labs    03/21/21 1718 03/21/21 1723 03/22/21 0833 03/22/21 1551 03/23/21 0232  HGB 14.2 15.3* 13.6  --  12.8  HCT 43.7 45.0 40.0  --  37.8  PLT 303  --  306  --  292  APTT 29  --   --   --   --   LABPROT 14.6  --   --   --   --   INR 1.1  --   --   --   --   HEPARINUNFRC  --   --  0.18* 0.17* 0.42  CREATININE 1.03* 1.10* 0.90  --  0.81     Estimated Creatinine Clearance: 116.7 mL/min (by C-G formula based on SCr of 0.81 mg/dL).   Medical History: Past Medical History:  Diagnosis Date   Asthma    Diabetes mellitus without complication (HCC)    Hypertension    Stroke [facility])     Assessment: 41 YO female admitted for code stroke. 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   Heparin level 0.42 this morning  Goal of Therapy:  Heparin level 0.3-0.5 units/ml Monitor platelets by anticoagulation protocol: Yes   Plan:  Decrease heparin to 1250 units / hr (to ensure not above goal) 8 hour heparin level Daily heparin level, CBC  Thank you  20, PharmD   03/23/2021 3:33 AM

## 2021-03-23 NOTE — Progress Notes (Signed)
Subjective: I seen and evaluated Christina [name] at bedside.  Her eldest son was present at bedside.  She states that overnight she continues to experience intermittent numbness of half of her face and tongue.  Denies difficulty swallowing or loss of taste.  Denies any other complaints at this time.  Objective:  Vital signs in last 24 hours: Vitals:   03/22/21 2341 03/23/21 0403 03/23/21 0724 03/23/21 1059  BP: 129/87 122/68 110/79 107/78  Pulse: 68 72 74 70  Resp: 18 18 14 14   Temp: 98.1 F (36.7 C) 98.3 F (36.8 C) 98.2 F (36.8 C) 98.5 F (36.9 C)  TempSrc: Oral Oral Oral Oral  SpO2: 97% 100%  95%  Weight:      Height:       Physical Exam Constitutional:      General: She is awake.  Cardiovascular:     Rate and Rhythm: Normal rate and regular rhythm.     Heart sounds: Normal heart sounds.  Pulmonary:     Effort: Pulmonary effort is normal.     Breath sounds: Normal breath sounds and air entry.  Skin:    General: Skin is warm and dry.  Neurological:     Mental Status: She is alert. Mental status is at baseline.     Cranial Nerves: No dysarthria or facial asymmetry.  Psychiatric:        Behavior: Behavior is cooperative.    Assessment/Plan:  Principal Problem:   CVA (cerebral vascular accident) (HCC) Active Problems:   Type 2 diabetes mellitus (HCC)   #TIA: bilateral hemisphere TIA due to b/l ICA injury/dissection with sneeze vs. Cardioembolic source #Hx of Stroke Patient is stable and normotensive.  She endorses some unilateral intermittent facial numbness including the tongue.  Denies difficulty swallowing or ageusia.  CTA head no acute abnormalities; old right MCA infarct.  CTA head and neck reveals right ICA thrombus which is new.  An MRA of the neck revealed similar right ICA thrombus.  Patient currently on heparin gtt for VTE prophylaxis.  Patient is tolerating the heparin well, no sign of active bleeding.  Per neurology, patient will benefit from antiplatelet  and anticoagulation therapy.  Will transition patient off heparin to Eliquis 5 mg twice daily, along with aspirin ASA 81 mg daily.  Patient already on rosuvastatin and will continue.  Patient is to follow-up in 2 to 3 months with stroke clinic to repeat CTA of head and neck, to reevaluate presence of thrombus.  This was discussed with Dr. , recommendations appreciated. --Heparin gtt ---> Eliquis 5 mg twice daily tomorrow and ASA 81mg  daily -- Rosuvastatin 20 mg daily --Autoimmune work-up still pending --At time of discharge, patient is to follow-up with stroke clinic in 3 months and continue the Eliquis twice daily for 3 months and then aspirin 81mg  indefinitely.  Hypokalemia On admission patient was found to be hypokalemic and received potassium repletion.  Since admission, patient has had 2 episodes of decreased potassium levels requiring repletion.  Most recently overnight.  Patient is currently not on any medications that would result in hypokalemia.  At home patient takes chlorthalidone for her blood pressure.  However on admission all antihypertensive have been held.  Ruling out medication effect.  Will obtain renal studies to source etiology. --Potassium tablets 40 mEq twice daily  -- Serum aldosterone/renin with ratio pending --Random urine sodium and potassium pending  Type 2 diabetes Current A1c 5.2%.  CBGs have been within reference range. --Semglee 30 units at bedtime --SSI --  Continue holding metformin --CBG monitoring  Asthma Likely etiology of CVA event, secondary to sneezing as stated above.  Patient endorsed to neurologist frequent sneezing.  Sneezing common in asthmatic individuals.  However, no asthma exacerbation noted.  Lungs clear on auscultation, wheezing noted.  Currently patient uses albuterol as needed.  -- Increased frequency if needed in the setting of sneezing --Consider antihistamine, if patient complains of seasonal allergies   Prior to Admission Living  Arrangement: Anticipated Discharge Location: Barriers to Discharge: Dispo: Anticipated discharge in approximately 1-2 day(s).   Dellis Filbert, MD 03/23/2021, 12:11 PM Pager: [phone] After 5pm on weekdays and 1pm on weekends: On Call pager [phone]

## 2021-03-23 NOTE — Progress Notes (Signed)
ANTICOAGULATION CONSULT NOTE - Follow Up Consult  Pharmacy Consult for Heparin Indication:  ICA dissection  Allergies  Allergen Reactions   Peanut-Containing Drug Products Anaphylaxis   Lisinopril Other (See Comments)    Lip swelling   Penicillins Rash   Tomato Rash    Fresh tomatoes     Patient Measurements: Height: 5\' 7"  (170.2 cm) Weight: 103.8 kg (228 lb 13.4 oz) IBW/kg (Calculated) : 61.6 kg Heparin Dosing Weight: 85 kg  Vital Signs: Temp: 98.2 F (36.8 C) (02/18 0724) Temp Source: Oral (02/18 0724) BP: 110/79 (02/18 0724) Pulse Rate: 74 (02/18 0724)  Labs: Recent Labs    03/21/21 1718 03/21/21 1723 03/22/21 0833 03/22/21 1551 03/23/21 0232  HGB 14.2 15.3* 13.6  --  12.8  HCT 43.7 45.0 40.0  --  37.8  PLT 303  --  306  --  292  APTT 29  --   --   --   --   LABPROT 14.6  --   --   --   --   INR 1.1  --   --   --   --   HEPARINUNFRC  --   --  0.18* 0.17* 0.42  CREATININE 1.03* 1.10* 0.90  --  0.81    Estimated Creatinine Clearance: 116.7 mL/min (by C-G formula based on SCr of 0.81 mg/dL).  Assessment: 41 yo female presents with a history of stroke and is found to have a new filling defect in the proximal right ICA in the neck, concerning for intraluminal clot and possible plaque rupture. The patient negative for DVT. PTA the patient is not on anticoagulation and has not been compliant with antiplatelet therapy. Pharmacy is consulted to dose heparin.  Heparin level is therapeutic at 0.41 while running at 1250 units/hr. Per the RN, there were no issues with the infusion and the patient is without signs or symptoms of bleeding. Hgb 12.8, platelets 292.   Goal of Therapy:  Heparin level 0.3-0.5 units/ml Monitor platelets by anticoagulation protocol: Yes   Plan:  Continue IV heparin at 1250 units/hr Obtain a daily heparin level and CBC Monitor for signs and symptoms of bleeding Follows plans to transition to oral anticoagulation  20, PharmD,  RPh  PGY-2 Pharmacy Resident 03/23/2021 7:45 AM  Please check [email or web address] for unit-specific pharmacy phone numbers.

## 2021-03-23 NOTE — TOC Initial Note (Signed)
Transition of Care [facility]) - Initial/Assessment Note    Patient Details  Name: Christina [name] MRN: [id number] Date of Birth: Oct 29, 1980  Transition of Care [facility]) CM/SW Contact:    Danie Chandler., RN Phone Number: 03/23/2021, 2:04 PM  Clinical Narrative:       Eliquis Card and co pay information given to patient, patient very receptive. TOC following.              Expected Discharge Plan: Home/Self Care Barriers to Discharge: No Barriers Identified   Patient Goals and CMS Choice Patient states their goals for this hospitalization and ongoing recovery are:: to be able to go home   Choice offered to / list presented to : NA  Expected Discharge Plan and Services Expected Discharge Plan: Home/Self Care   Discharge Planning Services: CM Consult Post Acute Care Choice: NA Living arrangements for the past 2 months: Single Family Home                 DME Arranged: N/A DME Agency: NA       HH Arranged: NA HH Agency: NA        Prior Living Arrangements/Services Living arrangements for the past 2 months: Single Family Home Lives with:: Self Patient language and need for interpreter reviewed:: Yes Do you feel safe going back to the place where you live?: Yes      Need for Family Participation in Patient Care: No (Comment) Care giver support system in place?: Yes (comment)   Criminal Activity/Legal Involvement Pertinent to Current Situation/Hospitalization: No - Comment as needed  Activities of Daily Living Home Assistive Devices/Equipment: Eyeglasses ADL Screening (condition at time of admission) Patient's cognitive ability adequate to safely complete daily activities?: Yes Is the patient deaf or have difficulty hearing?: No Does the patient have difficulty seeing, even when wearing glasses/contacts?: No Does the patient have difficulty concentrating, remembering, or making decisions?: No Patient able to express need for assistance with ADLs?: Yes Does the patient have  difficulty dressing or bathing?: No Independently performs ADLs?: Yes (appropriate for developmental age) Does the patient have difficulty walking or climbing stairs?: No Weakness of Legs: None Weakness of Arms/Hands: None  Permission Sought/Granted                  Emotional Assessment Appearance:: Appears stated age, Well-Groomed Attitude/Demeanor/Rapport: Engaged, Self-Confident Affect (typically observed): Accepting, Appropriate, Calm Orientation: : Oriented to Self, Oriented to Place, Oriented to  Time, Oriented to Situation      Admission diagnosis:  Cough [R05.9] TIA (transient ischemic attack) [G45.9] CVA (cerebral vascular accident) [facility]) [I63.9] Patient Active Problem List   Diagnosis Date Noted   Type 2 diabetes mellitus (HCC) 03/22/2021   CVA (cerebral vascular accident) (HCC) 03/21/2021   Stroke (HCC) 05/28/2020   Acute ischemic stroke (HCC) 05/27/2020   Middle cerebral artery embolism, 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 [place] [address] Phone: [phone] Fax: [phone]     Social Determinants of Health (SDOH) Interventions    Readmission Risk Interventions No flowsheet data found.

## 2021-03-23 NOTE — Progress Notes (Signed)
STROKE TEAM PROGRESS NOTE   SUBJECTIVE (INTERVAL HISTORY) RN is at the bedside. Pt lying in bed, doing well. Stated that she still has on and off fingertip tingling or right face tingling, but the one persistent is her left tongue tingling. She is tolerating heparin IV well, plan to switch to DOAC tomorrow. She was found to have low Mg, under supplement.    OBJECTIVE Temp:  [97.6 F (36.4 C)-98.5 F (36.9 C)] 98.5 F (36.9 C) (02/18 1059) Pulse Rate:  [68-85] 70 (02/18 1059) Cardiac Rhythm: Normal sinus rhythm;Bundle branch block (02/18 0836) Resp:  [14-20] 14 (02/18 1059) BP: (107-129)/(68-89) 107/78 (02/18 1059) SpO2:  [95 %-100 %] 95 % (02/18 1059)  Recent Labs  Lab 03/22/21 1131 03/22/21 1645 03/22/21 2155 03/23/21 0558 03/23/21 1127  GLUCAP 134* 102* 167* 87 113*   Recent Labs  Lab 03/21/21 1718 03/21/21 1723 03/21/21 2358 03/22/21 0833 03/22/21 1551 03/23/21 0232  NA 134* 136  --  134*  --  134*  K 3.5 3.5  --  2.4* 4.1 2.9*  CL 94* 92*  --  94*  --  99  CO2 29  --   --  30  --  27  GLUCOSE 157* 154*  --  129*  --  117*  BUN 12 14  --  11  --  12  CREATININE 1.03* 1.10*  --  0.90  --  0.81  CALCIUM 8.9  --   --  9.1  --  8.2*  MG  --   --  1.5*  --   --  1.9  PHOS  --   --  2.6  --   --   --    Recent Labs  Lab 03/21/21 1718 03/22/21 0833  AST 21 17  ALT 16 15  ALKPHOS 43 38  BILITOT 1.2 0.8  PROT 7.9 7.5  ALBUMIN 3.8 3.6   Recent Labs  Lab 03/21/21 1718 03/21/21 1723 03/22/21 0833 03/23/21 0232  WBC 3.8*  --  4.8 4.5  NEUTROABS 1.5*  --   --  1.0*  HGB 14.2 15.3* 13.6 12.8  HCT 43.7 45.0 40.0 37.8  MCV 94.4  --  91.3 91.7  PLT 303  --  306 292   No results for input(s): CKTOTAL, CKMB, CKMBINDEX, TROPONINI in the last 168 hours. Recent Labs    03/21/21 1718  LABPROT 14.6  INR 1.1   Recent Labs    03/21/21 0001  COLORURINE YELLOW  LABSPEC 1.040*  PHURINE 7.0  GLUCOSEU NEGATIVE  HGBUR NEGATIVE  BILIRUBINUR NEGATIVE  KETONESUR  NEGATIVE  PROTEINUR NEGATIVE  NITRITE NEGATIVE  LEUKOCYTESUR NEGATIVE       Component Value Date/Time   CHOL 75 03/21/2021 2358   TRIG 69 03/21/2021 2358   HDL 27 (L) 03/21/2021 2358   CHOLHDL 2.8 03/21/2021 2358   VLDL 14 03/21/2021 2358   LDLCALC 34 03/21/2021 2358   Lab Results  Component Value Date   HGBA1C 5.2 03/21/2021      Component Value Date/Time   LABOPIA NONE DETECTED 03/21/2021 0001   COCAINSCRNUR NONE DETECTED 03/21/2021 0001   LABBENZ NONE DETECTED 03/21/2021 0001   AMPHETMU NONE DETECTED 03/21/2021 0001   THCU POSITIVE (A) 03/21/2021 0001   LABBARB NONE DETECTED 03/21/2021 0001    Recent Labs  Lab 03/21/21 1718  ETH <10    I have personally reviewed the radiological images below and agree with the radiology interpretations.  CT HEAD WO CONTRAST ( )  Result Date:  03/22/2021 CLINICAL DATA:  Code stroke follow-up EXAM: CT HEAD WITHOUT CONTRAST TECHNIQUE: Contiguous axial images were obtained from the base of the skull through the vertex without intravenous contrast. RADIATION DOSE REDUCTION: This exam was performed according to the departmental dose-optimization program which includes automated exposure control, adjustment of the mA and/or kV according to patient size and/or use of iterative reconstruction technique. COMPARISON:  03/21/2021 FINDINGS: Brain: No acute hemorrhage. There is periventricular hypoattenuation compatible with chronic microvascular disease. Unchanged appearance of old right MCA territory infarcts. Vascular: No abnormal hyperdensity of the major intracranial arteries or dural venous sinuses. No intracranial atherosclerosis. Skull: The visualized skull base, calvarium and extracranial soft tissues are normal. Sinuses/Orbits: No fluid levels or advanced mucosal thickening of the visualized paranasal sinuses. No mastoid or middle ear effusion. The orbits are normal. IMPRESSION: 1. No acute intracranial abnormality. 2. 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 Electronically Signed   By: Deatra Robinson M.D.   On: 03/22/2021 01:07   MR ANGIO NECK W WO CONTRAST  Result Date: 03/21/2021 CLINICAL DATA:  Abnormal CTA with filling defect right ICA EXAM: MRA NECK WITHOUT AND WITH CONTRAST TECHNIQUE: Multiplanar and multiecho pulse sequences of the neck were obtained without and with intravenous contrast. Angiographic images of the neck were obtained using MRA technique without and with intravenous contrast. CONTRAST:  10mL GADAVIST GADOBUTROL 1 MMOL/ML IV SOLN COMPARISON:  CTA head and neck 03/21/2021 FINDINGS: MRI degraded by motion. MRA confirms a filling defect in the proximal right ICA causing mild narrowing of the lumen. Remainder of the right internal carotid artery normal Left carotid bifurcation normal without stenosis or irregularity Both vertebral arteries normal and widely patent. Antegrade flow in the carotid and vertebral arteries bilaterally. IMPRESSION: Filling defect in the proximal right ICA causing mild luminal stenosis. This confirms the finding on CT angiogram. This could be due to underlying plaque rupture or dissection. These results were called by telephone at the time of interpretation on 03/21/2021 at 6:55 pm to provider Palikh, who verbally acknowledged these results. Electronically Signed   By: Marlan Palau M.D.   On: 03/21/2021 18:56   MR BRAIN WO CONTRAST  Result Date: 03/21/2021 CLINICAL DATA:  Neuro deficit, acute, stroke suspected EXAM: MRI HEAD WITHOUT CONTRAST TECHNIQUE: Multiplanar, multiecho pulse sequences of the brain and surrounding structures were obtained without intravenous contrast. COMPARISON:  MRI May 28, 2020. FINDINGS: Brain: No acute infarction, hemorrhage, hydrocephalus, extra-axial collection or mass lesion. Remote right MCA territory infarcts involving the insula and overlying operculum. A couple small T2/FLAIR hyperintensities the white matter, nonspecific but  both chronic microvascular disease. As before, the cerebellar tonsils extend 5-6 mm below the level of the foramen magnum Vascular: Major arterial flow voids are maintained at the skull base. Further evaluated on same day CTA. Skull and upper cervical spine: Normal marrow signal. Sinuses/Orbits: Mild paranasal sinus mucosal thickening with opacified left posterior ethmoid air cell and bilateral inferior maxillary sinus retention cysts. Other: No mastoid effusions. IMPRESSION: 1. No evidence of acute infarct. 2. Remote right MCA territory infarcts. 3. As before, the cerebellar tonsils extend 5-6 mm below the level of the foramen magnum with mild crowding at the level of the foramen magnum. 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 As noted on the prior, cerebellar tonsillar ectopia may be seen in the setting of idiopathic intracranial hypertension or intracranial hypotension. There is a partially empty sella turcica, which can also be seen with intracranial hypertension. Electronically  Signed   By: Feliberto Harts M.D.   On: 03/21/2021 18:12   DG CHEST PORT 1 VIEW  Result Date: 03/21/2021 CLINICAL DATA:  Cough EXAM: PORTABLE CHEST 1 VIEW COMPARISON:  09/16/2019 FINDINGS: The heart size and mediastinal contours are within normal limits. Both lungs are clear. The visualized skeletal structures are unremarkable. IMPRESSION: No active disease. Electronically Signed   By: Sharlet Salina M.D.   On: 03/21/2021 20:25   ECHOCARDIOGRAM COMPLETE  Result Date: 03/22/2021    ECHOCARDIOGRAM REPORT   Patient Name:   SHERON TALLMAN Date of Exam: 03/22/2021 Medical Rec #:  [id number]       Height:       67.0 in Accession #:    [id number]      Weight:       228.8 lb Date of Birth:  [birth year]        BSA:          2.141 m Patient Age:    40 years        BP:           123/85 mmHg Patient Gender: F               HR:           88 bpm. Exam Location:  Inpatient Procedure: 2D Echo, Cardiac Doppler and Color Doppler Indications:     Stroke  History:        Patient has prior history of Echocardiogram examinations.                 Stroke.  Sonographer:    Cleatis Polka Referring Phys: [id number] CAROLYN GUILLOUD IMPRESSIONS  1. Left ventricular ejection fraction, by estimation, is 55 to 60%. The left ventricle has normal function. The left ventricle has no regional wall motion abnormalities. Left ventricular diastolic parameters are consistent with Grade II diastolic dysfunction (pseudonormalization).  2. Right ventricular systolic function is normal. The right ventricular size is normal. Tricuspid regurgitation signal is inadequate for assessing PA pressure.  3. The mitral valve is normal in structure. No evidence of mitral valve regurgitation. No evidence of mitral stenosis.  4. The aortic valve is tricuspid. Aortic valve regurgitation is not visualized. No aortic stenosis is present.  5. The inferior vena cava is normal in size with greater than 50% respiratory variability, suggesting right atrial pressure of 3 mmHg. FINDINGS  Left Ventricle: Left ventricular ejection fraction, by estimation, is 55 to 60%. The left ventricle has normal function. The left ventricle has no regional wall motion abnormalities. The left ventricular internal cavity size was normal in size. There is  no left ventricular hypertrophy. Left ventricular diastolic parameters are consistent with Grade II diastolic dysfunction (pseudonormalization). Right Ventricle: The right ventricular size is normal. No increase in right ventricular wall thickness. Right ventricular systolic function is normal. Tricuspid regurgitation signal is inadequate for assessing PA pressure. Left Atrium: Left atrial size was normal in size. Right Atrium: Right atrial size was normal in size. Pericardium: There is no evidence of pericardial effusion. Mitral Valve: The mitral valve is normal in structure. There is mild calcification of the mitral valve leaflet(s). No evidence of mitral valve  regurgitation. No evidence of mitral valve stenosis. Tricuspid Valve: The tricuspid valve is normal in structure. Tricuspid valve regurgitation is not demonstrated. Aortic Valve: The aortic valve is tricuspid. Aortic valve regurgitation is not visualized. No aortic stenosis is present. Aortic valve peak gradient measures 5.5 mmHg. Pulmonic Valve: The pulmonic valve was normal  in structure. Pulmonic valve regurgitation is not visualized. Aorta: The aortic root is normal in size and structure. Venous: The inferior vena cava is normal in size with greater than 50% respiratory variability, suggesting right atrial pressure of 3 mmHg. IAS/Shunts: No atrial level shunt detected by color flow Doppler.  LEFT VENTRICLE PLAX 2D LVIDd:         4.50 cm     Diastology LVIDs:         2.90 cm     LV e' medial:    6.85 cm/s LV PW:         1.00 cm     LV E/e' medial:  10.9 LV IVS:        0.90 cm     LV e' lateral:   10.80 cm/s LVOT diam:     2.00 cm     LV E/e' lateral: 6.9 LV SV:         59 LV SV Index:   28 LVOT Area:     3.14 cm  LV Volumes (MOD) LV vol d, MOD A2C: 76.2 ml LV vol d, MOD A4C: 85.8 ml LV vol s, MOD A2C: 31.9 ml LV vol s, MOD A4C: 35.6 ml LV SV MOD A2C:     44.3 ml LV SV MOD A4C:     85.8 ml LV SV MOD BP:      47.6 ml RIGHT VENTRICLE             IVC RV Basal diam:  2.90 cm     IVC diam: 1.70 cm RV Mid diam:    1.90 cm RV S prime:     12.20 cm/s TAPSE (M-mode): 1.8 cm LEFT ATRIUM             Index        RIGHT ATRIUM           Index LA diam:        3.30 cm 1.54 cm/m   RA Area:     10.40 cm LA Vol (A2C):   25.6 ml 11.95 ml/m  RA Volume:   19.90 ml  9.29 ml/m LA Vol (A4C):   24.6 ml 11.49 ml/m LA Biplane Vol: 25.1 ml 11.72 ml/m  AORTIC VALVE AV Area (Vmax): 2.87 cm AV Vmax:        117.00 cm/s AV Peak Grad:   5.5 mmHg LVOT Vmax:      107.00 cm/s LVOT Vmean:     75.600 cm/s LVOT VTI:       0.188 m  AORTA Ao Root diam: 3.00 cm Ao Asc diam:  2.70 cm MITRAL VALVE MV Area (PHT): 4.36 cm    SHUNTS MV Decel Time: 174  msec    Systemic VTI:  0.19 m MV E velocity: 74.40 cm/s  Systemic Diam: 2.00 cm MV A velocity: 75.10 cm/s MV E/A ratio:  0.99 Dalton McleanMD Electronically signed by Wilfred Lacyalton McleanMD Signature Date/Time: 03/22/2021/2:06:25 PM    Final    CT HEAD CODE STROKE WO CONTRAST  Result Date: 03/21/2021 CLINICAL DATA:  Code stroke.  Neuro deficit, acute, stroke suspected EXAM: CT HEAD WITHOUT CONTRAST TECHNIQUE: Contiguous axial images were obtained from the base of the skull through the vertex without intravenous contrast. RADIATION DOSE REDUCTION: This exam was performed according to the departmental dose-optimization program which includes automated exposure control, adjustment of the mA and/or kV according to patient size and/or use of iterative reconstruction technique. COMPARISON:  CT May 27, 2020.  MRI May 28, 2020. FINDINGS: Brain: Remote infarcts in the right frontal cortex, insula and temporal lobe. No evidence of acute large vascular territory infarct, acute hemorrhage, mass lesion, hydrocephalus, midline shift. Streak artifact limits evaluation. Partially empty sella. Similar inferior tonsillar descent. Vascular: No definite hyperdense vessel identified. Streak artifact limits evaluation. Skull: No acute fracture. Sinuses/Orbits: Opacification of scattered ethmoid air cells and inferior maxillary sinus retention cysts. No acute orbital findings. Other: No mastoid effusions. ASPECTS [facility] Stroke Program Early CT Score) total score (0-10 with 10 being normal): 10. IMPRESSION: 1. No evidence of acute large vascular territory infarct or acute hemorrhage. ASPECTS is 10. 2. Remote right MCA territory infarcts. 3. Similar inferior cerebellar tonsillar descent. Code stroke imaging results were communicated on 03/21/2021 at 5:29 pm to provider Palikh via secure text paging. Electronically Signed   By: Feliberto Harts M.D.   On: 03/21/2021 17:30   VAS [place] LOWER EXTREMITY VENOUS (DVT)  Result Date: 03/22/2021   Lower Venous DVT Study Patient Name:  ABIGIAL [place]LE  Date of Exam:   03/22/2021 Medical Rec #: [id number]        Accession #:    [id number] Date of Birth: Oct 30, 1980         Patient Gender: F Patient Age:   31 years Exam Location:  [facility] Procedure:      VAS [place] LOWER EXTREMITY VENOUS (DVT) Referring Phys: Scheryl Marten Isabela Nardelli --------------------------------------------------------------------------------  Indications: Stroke.  Comparison Study: 05/28/2020 negative lower extremity venous duplex Performing Technologist: Gertie Fey MHA, RDMS, RVT, RDCS  Examination Guidelines: A complete evaluation includes B-mode imaging, spectral Doppler, color Doppler, and power Doppler as needed of all accessible portions of each vessel. Bilateral testing is considered an integral part of a complete examination. Limited examinations for reoccurring indications may be performed as noted. The reflux portion of the exam is performed with the patient in reverse Trendelenburg.  +---------+---------------+---------+-----------+----------+--------------+  RIGHT     Compressibility Phasicity Spontaneity Properties Thrombus Aging  +---------+---------------+---------+-----------+----------+--------------+  CFV       Full            Yes       Yes                                    +---------+---------------+---------+-----------+----------+--------------+  SFJ       Full                                                             +---------+---------------+---------+-----------+----------+--------------+  FV Prox   Full                                                             +---------+---------------+---------+-----------+----------+--------------+  FV Mid    Full                                                             +---------+---------------+---------+-----------+----------+--------------+  FV Distal Full                                                              +---------+---------------+---------+-----------+----------+--------------+  PFV       Full                                                             +---------+---------------+---------+-----------+----------+--------------+  POP       Full            Yes       Yes                                    +---------+---------------+---------+-----------+----------+--------------+  PTV       Full                                                             +---------+---------------+---------+-----------+----------+--------------+  PERO      Full                                                             +---------+---------------+---------+-----------+----------+--------------+   +---------+---------------+---------+-----------+----------+--------------+  LEFT      Compressibility Phasicity Spontaneity Properties Thrombus Aging  +---------+---------------+---------+-----------+----------+--------------+  CFV       Full            Yes       Yes                                    +---------+---------------+---------+-----------+----------+--------------+  SFJ       Full                                                             +---------+---------------+---------+-----------+----------+--------------+  FV Prox   Full                                                             +---------+---------------+---------+-----------+----------+--------------+  FV Mid    Full                                                             +---------+---------------+---------+-----------+----------+--------------+  FV Distal Full                                                             +---------+---------------+---------+-----------+----------+--------------+  PFV       Full                                                             +---------+---------------+---------+-----------+----------+--------------+  POP       Full            Yes       Yes                                     +---------+---------------+---------+-----------+----------+--------------+  PTV       Full                                                             +---------+---------------+---------+-----------+----------+--------------+  PERO      Full                                                             +---------+---------------+---------+-----------+----------+--------------+     Summary: RIGHT: - There is no evidence of deep vein thrombosis in the lower extremity.  - No cystic structure found in the popliteal fossa.  LEFT: - There is no evidence of deep vein thrombosis in the lower extremity.  - No cystic structure found in the popliteal fossa.  *See table(s) above for measurements and observations. Electronically signed by Orlie Pollen on 03/22/2021 at 6:33:52 PM.    Final    CT ANGIO HEAD NECK W WO CM (CODE STROKE)  Result Date: 03/21/2021 CLINICAL DATA:  Neuro deficit, acute, stroke suspected EXAM: CT ANGIOGRAPHY HEAD AND NECK TECHNIQUE: Multidetector CT imaging of the head and neck was performed using the standard protocol during bolus administration of intravenous contrast. Multiplanar CT image reconstructions and MIPs were obtained to evaluate the vascular anatomy. Carotid stenosis measurements (when applicable) are obtained utilizing NASCET criteria, using the distal internal carotid diameter as the denominator. RADIATION DOSE REDUCTION: This exam was performed according to the departmental dose-optimization program which includes automated exposure control, adjustment of the mA and/or kV according to patient size and/or use of iterative reconstruction technique. CONTRAST:  7mL OMNIPAQUE IOHEXOL 350 MG/ML SOLN COMPARISON:  CTA May 27, 2020. FINDINGS: CTA NECK FINDINGS Aortic arch: Great vessel origins are patent. Right carotid system: Common carotid artery is patent without significant stenosis. There is eccentric intraluminal filling defect within the proximal right ICA, which is not apparent on the  comparison prior CTA. This filling defect also appears to extend into the lumen more superiorly.  No significant (greater than 50%) stenosis. Tortuous ICA. Left carotid system: No evidence of dissection, stenosis (50% or greater) or occlusion. Retropharyngeal course. Tortuous ICA. Vertebral arteries: Mildly right dominant. Proximal left vertebral artery is obscured by streak artifact from pooled venous contrast. Within this limitation, no visible significant (greater than 50%) stenosis. The visible vertebral arteries are patent bilaterally. Skeleton: No acute abnormality on limited assessment. Other neck: No acute abnormality on limited assessment. Upper chest: Visualized lung apices are clear. Review of the MIP images confirms the above findings CTA HEAD FINDINGS Anterior circulation: Bilateral intracranial ICAs are patent without significant stenosis. The proximal bilateral MCAs and ACAs are patent without proximal hemodynamically significant stenosis. Tortuous right M2 MCA branch loops back on itself, but appears to remain patent. No aneurysm identified. Posterior circulation: Bilateral intradural vertebral arteries, basilar artery, and posterior cerebral arteries are patent without proximal hemodynamically significant stenosis. Mild bilateral PCA stenosis. Small left P1 PCA with prominent left posterior communicating artery, anatomic variant. No aneurysm identified. Venous sinuses: As permitted by contrast timing, patent. Review of the MIP images confirms the above findings IMPRESSION: 1. New filling defect in the proximal right ICA in the neck, concerning for intraluminal clot and possible plaque rupture. 2. No large vessel occlusion or proximal hemodynamically significant stenosis in the head or neck. Findings discussed with Dr. Viviann Spare via telephone at 5:32 p.m. Electronically Signed   By: Feliberto Harts M.D.   On: 03/21/2021 17:57     PHYSICAL EXAM  Temp:  [97.6 F (36.4 C)-98.5 F (36.9 C)] 98.5 F  (36.9 C) (02/18 1059) Pulse Rate:  [68-85] 70 (02/18 1059) Resp:  [14-20] 14 (02/18 1059) BP: (107-129)/(68-89) 107/78 (02/18 1059) SpO2:  [95 %-100 %] 95 % (02/18 1059)  General - Well nourished, well developed, in no apparent distress.  Ophthalmologic - fundi not visualized due to noncooperation.  Cardiovascular - Regular rhythm and rate.  Mental Status -  Level of arousal and orientation to time, place, and person were intact. Language including expression, naming, repetition, comprehension was assessed and found intact. Fund of Knowledge was assessed and was intact.  Cranial Nerves II - XII - II - Visual field intact OU. III, IV, VI - Extraocular movements intact. V - Facial sensation intact bilaterally. VII - Facial movement intact bilaterally. VIII - Hearing & vestibular intact bilaterally. X - Palate elevates symmetrically. XI - Chin turning & shoulder shrug intact bilaterally. XII - Tongue protrusion intact. Subjective left tongue tingling.  Motor Strength - The patients strength was normal in all extremities and pronator drift was absent.  Bulk was normal and fasciculations were absent.   Motor Tone - Muscle tone was assessed at the neck and appendages and was normal.  Reflexes - The patients reflexes were symmetrical in all extremities and she had no pathological reflexes.  Sensory - Light touch, temperature/pinprick were assessed and were symmetrical.    Coordination - The patient had normal movements in the hands and feet with no ataxia or dysmetria.  Tremor was absent.  Gait and Station - deferred.   ASSESSMENT/PLAN Ms. Mally Marinaccio is a 41 y.o. female with history of diabetes, hypertension, smoker, obesity, stroke in 05/2020 admitted for right facial, arm and leg numbness with left hand numbness.   TIA:  bilateral hemisphere TIA due to b/l ICA injury/dissection with sneeze vs. Cardioembolic source CT head no acute abnormality, old right MCA infarct. CTA  head and neck right ICA thrombus, which is new MRI no acute infarct, Chiari I  malformation MRA neck similar right ICA thrombus as CTA neck Repeat CT no acute abnormality. 2D Echo EF 55 to 60% LE venous Doppler negative for DVT LDL 34 HgbA1c 5.2 UDS positive for THC Heparin IV for VTE prophylaxis No antithrombotic prior to admission, now on heparin IV. Recommend to switch to DOAC tomorrow for 2-3 months and then repeat CTA head and neck, once ICA thrombus resolves, can switch back to antiplatelet.  Patient counseled to be compliant with her antithrombotic medications Ongoing aggressive stroke risk factor management Therapy recommendations: None Disposition: Pending  History of stroke 05/2020 admitted for left-sided weakness, numbness left facial droop, left neglect and right gaze after a sneeze.  Status post tPA.  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  Status post IR with TICI3.  MRI showed right MCA infarct and right MCA/ACA, MCA/PCA watershed infarcts.  EF 55 to 60%.  DVT negative.  TCD bubble study negative.  TEE showed positive bubble but negative color Doppler indicating small PFO.  ANA positive.  LDL 15 and A1c 9.7, patient discharged on aspirin and Crestor 20.  Diabetes HgbA1c 5.2 goal < 7.0 Controlled CBG monitoring SSI PCP follow up  Hypertension Stable Long term BP goal normotensive  Hyperlipidemia Home meds: Crestor 20 LDL 34, goal < 70 Now on Crestor 20 Continue statin at discharge  Other Stroke Risk Factors Cigarette smoker, advised to stop smoking Obesity, Body mass index is 35.84 kg/m.   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  Hypomagnesemia - supplement   Hospital day # 1   Marvel PlanJindong Lennix Rotundo, MD PhD Stroke Neurology 03/23/2021 12:43 PM    To contact Stroke Continuity provider, please refer to [email or web address]. After hours, contact General  Neurology

## 2021-03-24 DIAGNOSIS — E876 Hypokalemia: Secondary | ICD-10-CM

## 2021-03-24 DIAGNOSIS — I6521 Occlusion and stenosis of right carotid artery: Secondary | ICD-10-CM | POA: Diagnosis not present

## 2021-03-24 DIAGNOSIS — G459 Transient cerebral ischemic attack, unspecified: Secondary | ICD-10-CM | POA: Diagnosis not present

## 2021-03-24 DIAGNOSIS — E119 Type 2 diabetes mellitus without complications: Secondary | ICD-10-CM | POA: Diagnosis not present

## 2021-03-24 DIAGNOSIS — I639 Cerebral infarction, unspecified: Secondary | ICD-10-CM | POA: Diagnosis not present

## 2021-03-24 DIAGNOSIS — J45909 Unspecified asthma, uncomplicated: Secondary | ICD-10-CM | POA: Diagnosis not present

## 2021-03-24 DIAGNOSIS — I10 Essential (primary) hypertension: Secondary | ICD-10-CM | POA: Diagnosis not present

## 2021-03-24 LAB — BASIC METABOLIC PANEL
Anion gap: 7 (ref 5–15)
BUN: 8 mg/dL (ref 6–20)
CO2: 29 mmol/L (ref 22–32)
Calcium: 9 mg/dL (ref 8.9–10.3)
Chloride: 98 mmol/L (ref 98–111)
Creatinine, Ser: 0.78 mg/dL (ref 0.44–1.00)
GFR, Estimated: >60 mL/min (ref >60)
Glucose, Bld: 88 mg/dL (ref 70–99)
Potassium: 3.6 mmol/L (ref 3.5–5.1)
Sodium: 134 mmol/L — ABNORMAL LOW (ref 135–145)

## 2021-03-24 LAB — CBC
HCT: 39.3 % (ref 36.0–46.0)
Hemoglobin: 13.5 g/dL (ref 12.0–15.0)
MCH: 31.7 pg (ref 26.0–34.0)
MCHC: 34.4 g/dL (ref 30.0–36.0)
MCV: 92.3 fL (ref 80.0–100.0)
Platelets: 311 10*3/uL (ref 150–400)
RBC: 4.26 MIL/uL (ref 3.87–5.11)
RDW: 11.7 % (ref 11.5–15.5)
WBC: 6.5 10*3/uL (ref 4.0–10.5)
nRBC: 0 % (ref 0.0–0.2)

## 2021-03-24 LAB — HEPARIN LEVEL (UNFRACTIONATED): Heparin Unfractionated: 0.43 IU/mL (ref 0.30–0.70)

## 2021-03-24 LAB — BETA-2-GLYCOPROTEIN I ABS, IGG/M/A
Beta-2 Glyco I IgG: <9 GPI IgG units (ref 0–20)
Beta-2-Glycoprotein I IgA: <9 GPI IgA units (ref 0–25)
Beta-2-Glycoprotein I IgM: <9 GPI IgM units (ref 0–32)

## 2021-03-24 LAB — GLUCOSE, CAPILLARY
Glucose-Capillary: 92 mg/dL (ref 70–99)
Glucose-Capillary: 126 mg/dL — ABNORMAL HIGH (ref 70–99)
Glucose-Capillary: 106 mg/dL — ABNORMAL HIGH (ref 70–99)
Glucose-Capillary: 108 mg/dL — ABNORMAL HIGH (ref 70–99)

## 2021-03-24 MED ORDER — APIXABAN 5 MG PO TABS
5.0000 mg | ORAL_TABLET | Freq: Two times a day (BID) | ORAL | Status: DC
  Administered 2021-03-24 – 2021-03-25 (×2): 5 mg via ORAL
  Filled 2021-03-24 (×2): qty 1

## 2021-03-24 MED ORDER — APIXABAN 5 MG PO TABS
10.0000 mg | ORAL_TABLET | Freq: Two times a day (BID) | ORAL | Status: DC
  Administered 2021-03-24: 10 mg via ORAL
  Filled 2021-03-24: qty 2

## 2021-03-24 MED ORDER — APIXABAN 5 MG PO TABS: 5.0000 mg | ORAL_TABLET | Freq: Two times a day (BID) | ORAL | Status: DC

## 2021-03-24 MED ORDER — POTASSIUM CHLORIDE CRYS ER 20 MEQ PO TBCR
40.0000 meq | EXTENDED_RELEASE_TABLET | Freq: Two times a day (BID) | ORAL | Status: DC
  Administered 2021-03-24: 40 meq via ORAL
  Filled 2021-03-24: qty 2

## 2021-03-24 NOTE — Discharge Instructions (Addendum)
Information on my medicine - ELIQUIS (apixaban)  This medication education was reviewed with me or my healthcare representative as part of my discharge preparation.    Why was Eliquis prescribed for you? Eliquis was prescribed to treat a clot found in your internal carotid artery.  What do You need to know about Eliquis ? The dose is ONE 5 mg tablet taken TWICE daily.  Eliquis may be taken with or without food.   Try to take the dose about the same time in the morning and in the evening. If you have difficulty swallowing the tablet whole please discuss with your pharmacist how to take the medication safely.  Take Eliquis exactly as prescribed and DO NOT stop taking Eliquis without talking to the doctor who prescribed the medication.  Stopping may increase your risk of developing a new blood clot.  Refill your prescription before you run out.  After discharge, you should have regular check-up appointments with your healthcare provider that is prescribing your Eliquis.    What do you do if you miss a dose? If a dose of ELIQUIS is not taken at the scheduled time, take it as soon as possible on the same day and twice-daily administration should be resumed. The dose should not be doubled to make up for a missed dose.  Important Safety Information A possible side effect of Eliquis is bleeding. You should call your healthcare provider right away if you experience any of the following: Bleeding from an injury or your nose that does not stop. Unusual colored urine (red or dark brown) or unusual colored stools (red or black). Unusual bruising for unknown reasons. A serious fall or if you hit your head (even if there is no bleeding).  Some medicines may interact with Eliquis and might increase your risk of bleeding or clotting while on Eliquis. To help avoid this, consult your healthcare provider or pharmacist prior to using any new prescription or non-prescription medications, including  herbals, vitamins, non-steroidal anti-inflammatory drugs (NSAIDs) and supplements.  This website has more information on Eliquis (apixaban): http://[email or web address].com/eliquis/home

## 2021-03-24 NOTE — Progress Notes (Signed)
HD#2 Subjective:  Overnight Events: No acute events overnight  Patient resting in bed comfortably eating breakfast.  She denies any new neurological deficits.  She states that she continues to have left-sided tongue tingling.  Otherwise she denies any other acute concerns.  Objective:  Vital signs in last 24 hours: Vitals:   03/23/21 1524 03/23/21 2010 03/23/21 2343 03/24/21 0325  BP: 119/76 126/72 (!) 125/93 116/74  Pulse: 75 76 72 65  Resp: 20 20 18 18   Temp: 98.5 F (36.9 C) 98.2 F (36.8 C) 98.3 F (36.8 C) 98.1 F (36.7 C)  TempSrc: Oral Oral Oral Oral  SpO2: 100% 100% 100% 98%  Weight:      Height:       Supplemental O2: Room Air SpO2: 98 %  Physical Exam:  Constitutional: Well-appearing, no acute distress HENT: normocephalic atraumatic, mucous membranes moist Eyes: conjunctiva non-erythematous Neck: supple Cardiovascular: regular rate and rhythm, no m/r/g Pulmonary/Chest: normal work of breathing on room air, lungs clear to auscultation bilaterally Abdominal: soft, non-tender, non-distended MSK: normal bulk and tone Neurological: alert & oriented x 3, 5/5 strength in bilateral upper and lower extremities.  Pupils equal round reactive to light.  No facial asymmetry.  No tongue fasciculations.  No uvular deviation.  Sensation intact bilaterally Skin: warm and dry Psych: Normal mood and thought process  Filed Weights   03/21/21 1500 03/21/21 1924  Weight: 103.8 kg 103.8 kg     Intake/Output Summary (Last 24 hours) at 03/24/2021 0707 Last data filed at 03/24/2021 0300 Gross per 24 hour  Intake 1476.61 ml  Output 0 ml  Net 1476.61 ml   Net IO Since Admission: 2,834.46 mL [03/24/21 0707]  Pertinent Labs: CBC Latest Ref Rng & Units 03/24/2021 03/23/2021 03/22/2021  WBC 4.0 - 10.5 K/uL 6.5 4.5 4.8  Hemoglobin 12.0 - 15.0 g/dL 13.5 12.8 13.6  Hematocrit 36.0 - 46.0 % 39.3 37.8 40.0  Platelets 150 - 400 K/uL 311 292 306    CMP Latest Ref Rng & Units  03/24/2021 03/23/2021 03/23/2021  Glucose 70 - 99 mg/dL 88 - 117(H)  BUN 6 - 20 mg/dL 8 - 12  Creatinine 0.44 - 1.00 mg/dL 0.78 - 0.81  Sodium 135 - 145 mmol/L 134(L) - 134(L)  Potassium 3.5 - 5.1 mmol/L 3.6 3.7 2.9(L)  Chloride 98 - 111 mmol/L 98 - 99  CO2 22 - 32 mmol/L 29 - 27  Calcium 8.9 - 10.3 mg/dL 9.0 - 8.2(L)  Total Protein 6.5 - 8.1 g/dL - - -  Total Bilirubin 0.3 - 1.2 mg/dL - - -  Alkaline Phos 38 - 126 U/L - - -  AST 15 - 41 U/L - - -  ALT 0 - 44 U/L - - -    Imaging: No results found.  Assessment/Plan:   Principal Problem:   CVA (cerebral vascular accident) ([place]) Active Problems:   Type 2 diabetes mellitus ([place])   Patient Summary: Christina [name] is a 41 y.o. living with a history of asthma, T2DM, HTN, and cryptogenic CVA, who presented with acute right-sided facial droop, right hemiparesis and right sensory deficits and admitted for further work-up and found to have right ICA thrombus thought to be causing patient TIAs.    TIA 2/2 right ICA thrombus versus dissection History of CVA CTA and MRI during this admission showed new filling defect in proximal right ICA with suspected intraluminal clot versus plaque rupture.  No new infarct seen on MRI.  TIA episodes may be exacerbated by  patient's sneezing spells that she gets periodically.  Denies new neurological deficits overnight.  We will plan to transition to [place] today.  Plan to have patient follow-up outpatient for repeat CTA head and neck and then switching back to antiplatelet once ICA thrombus resolved -Start Eliquis today for 2 to 3 months -Continue aspirin 81 mg, rosuvastatin 20 mg daily -Continue diabetic regimen as per below -Follow-up neurology outpatient for repeat CTA head neck -Continue Claritin/Allegra outpatient for frequent sneezing.  If not controlled consider intranasal corticosteroid  Hypokalemia Hypomagnesemia Patient has been hypokalemic during this admission.  Patient has been on diuretic  therapy prior to admission, this may be contributing to her electrolyte abnormalities.  Urine potassium normal urine protein to potassium ratio normal.  Low suspicion that this is urinary wasting.  She has no acid-base disturbance per labs during admission.  Was also found to have hypomagnesemia which may be part of the reason why she was persistently hypokalemic. We will replete electrolytes while she is admitted and plan for follow-up with PCP in outpatient basis.  Also suspect tongue tingling may be from her electrolyte deficiencies rather than her TIAs.  If any further neurological deficits become apparent will consider reimaging. -Follow-up outpatient basis -Follow-up renin/aldosterone ratio  Type 2 diabetes Home regimen metformin.  Current A1c of 5.2%. -Continue SSI and glucose checks with meals and nightly during admission -Restart metformin at discharge  Hypertension Blood pressure has been well controlled during admission.  Asthma Continue albuterol every 6 hours for wheezing  Diet: Normal IVF: None,None VTE: Full dose Eliquis Code: Full PT/OT recs: None, none. Family Update: Son at bedside yesterday  Dispo: Anticipated discharge to Home in 0 to 1 days  [facility] Resident PGY-2 Pager [phone] Please contact the on call pager after 5 pm and on weekends at [phone].

## 2021-03-24 NOTE — Progress Notes (Signed)
STROKE TEAM PROGRESS NOTE   SUBJECTIVE (INTERVAL HISTORY) No family is at the bedside. Pt lying in bed, doing well. Still has residue left tongue tingling feeling, otherwise no complains. Her heparin IV was switched to eliquis today.   OBJECTIVE Temp:  [97.8 F (36.6 C)-98.5 F (36.9 C)] 98.2 F (36.8 C) (02/19 1246) Pulse Rate:  [65-79] 77 (02/19 1246) Cardiac Rhythm: Sinus tachycardia (02/19 1322) Resp:  [18-20] 18 (02/19 0325) BP: (116-126)/(72-98) 124/90 (02/19 1246) SpO2:  [98 %-100 %] 100 % (02/19 1246)  Recent Labs  Lab 03/23/21 1127 03/23/21 1636 03/23/21 2214 03/24/21 0602 03/24/21 1149  GLUCAP 113* 116* 135* 92 126*   Recent Labs  Lab 03/21/21 1718 03/21/21 1723 03/21/21 2358 03/22/21 0833 03/22/21 1551 03/23/21 0232 03/23/21 1836 03/24/21 0246  NA 134* 136  --  134*  --  134*  --  134*  K 3.5 3.5  --  2.4* 4.1 2.9* 3.7 3.6  CL 94* 92*  --  94*  --  99  --  98  CO2 29  --   --  30  --  27  --  29  GLUCOSE 157* 154*  --  129*  --  117*  --  88  BUN 12 14  --  11  --  12  --  8  CREATININE 1.03* 1.10*  --  0.90  --  0.81  --  0.78  CALCIUM 8.9  --   --  9.1  --  8.2*  --  9.0  MG  --   --  1.5*  --   --  1.9  --   --   PHOS  --   --  2.6  --   --   --   --   --    Recent Labs  Lab 03/21/21 1718 03/22/21 0833  AST 21 17  ALT 16 15  ALKPHOS 43 38  BILITOT 1.2 0.8  PROT 7.9 7.5  ALBUMIN 3.8 3.6   Recent Labs  Lab 03/21/21 1718 03/21/21 1723 03/22/21 0833 03/23/21 0232 03/24/21 0246  WBC 3.8*  --  4.8 4.5 6.5  NEUTROABS 1.5*  --   --  1.0*  --   HGB 14.2 15.3* 13.6 12.8 13.5  HCT 43.7 45.0 40.0 37.8 39.3  MCV 94.4  --  91.3 91.7 92.3  PLT 303  --  306 292 311   No results for input(s): CKTOTAL, CKMB, CKMBINDEX, TROPONINI in the last 168 hours. Recent Labs    03/21/21 1718  LABPROT 14.6  INR 1.1   No results for input(s): COLORURINE, LABSPEC, PHURINE, GLUCOSEU, HGBUR, BILIRUBINUR, KETONESUR, PROTEINUR, UROBILINOGEN, NITRITE,  LEUKOCYTESUR in the last 72 hours.  Invalid input(s): APPERANCEUR      Component Value Date/Time   CHOL 75 03/21/2021 2358   TRIG 69 03/21/2021 2358   HDL 27 (L) 03/21/2021 2358   CHOLHDL 2.8 03/21/2021 2358   VLDL 14 03/21/2021 2358   LDLCALC 34 03/21/2021 2358   Lab Results  Component Value Date   HGBA1C 5.2 03/21/2021      Component Value Date/Time   LABOPIA NONE DETECTED 03/21/2021 0001   COCAINSCRNUR NONE DETECTED 03/21/2021 0001   LABBENZ NONE DETECTED 03/21/2021 0001   AMPHETMU NONE DETECTED 03/21/2021 0001   THCU POSITIVE (A) 03/21/2021 0001   LABBARB NONE DETECTED 03/21/2021 0001    Recent Labs  Lab 03/21/21 1718  ETH <10    I have personally reviewed the radiological images below and agree  with the radiology interpretations.  CT HEAD WO CONTRAST ( )  Result Date: 03/22/2021 CLINICAL DATA:  Code stroke follow-up EXAM: CT HEAD WITHOUT CONTRAST TECHNIQUE: Contiguous axial images were obtained from the base of the skull through the vertex without intravenous contrast. RADIATION DOSE REDUCTION: This exam was performed according to the departmental dose-optimization program which includes automated exposure control, adjustment of the mA and/or kV according to patient size and/or use of iterative reconstruction technique. COMPARISON:  03/21/2021 FINDINGS: Brain: No acute hemorrhage. There is periventricular hypoattenuation compatible with chronic microvascular disease. Unchanged appearance of old right MCA territory infarcts. Vascular: No abnormal hyperdensity of the major intracranial arteries or dural venous sinuses. No intracranial atherosclerosis. Skull: The visualized skull base, calvarium and extracranial soft tissues are normal. Sinuses/Orbits: No fluid levels or advanced mucosal thickening of the visualized paranasal sinuses. No mastoid or middle ear effusion. The orbits are normal. IMPRESSION: 1. No acute intracranial abnormality. 2. 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 Electronically Signed   By: Deatra Robinson M.D.   On: 03/22/2021 01:07   MR ANGIO NECK W WO CONTRAST  Result Date: 03/21/2021 CLINICAL DATA:  Abnormal CTA with filling defect right ICA EXAM: MRA NECK WITHOUT AND WITH CONTRAST TECHNIQUE: Multiplanar and multiecho pulse sequences of the neck were obtained without and with intravenous contrast. Angiographic images of the neck were obtained using MRA technique without and with intravenous contrast. CONTRAST:  82mL GADAVIST GADOBUTROL 1 MMOL/ML IV SOLN COMPARISON:  CTA head and neck 03/21/2021 FINDINGS: MRI degraded by motion. MRA confirms a filling defect in the proximal right ICA causing mild narrowing of the lumen. Remainder of the right internal carotid artery normal Left carotid bifurcation normal without stenosis or irregularity Both vertebral arteries normal and widely patent. Antegrade flow in the carotid and vertebral arteries bilaterally. IMPRESSION: Filling defect in the proximal right ICA causing mild luminal stenosis. This confirms the finding on CT angiogram. This could be due to underlying plaque rupture or dissection. These results were called by telephone at the time of interpretation on 03/21/2021 at 6:55 pm to provider Palikh, who verbally acknowledged these results. Electronically Signed   By: Marlan Palau M.D.   On: 03/21/2021 18:56   MR BRAIN WO CONTRAST  Result Date: 03/21/2021 CLINICAL DATA:  Neuro deficit, acute, stroke suspected EXAM: MRI HEAD WITHOUT CONTRAST TECHNIQUE: Multiplanar, multiecho pulse sequences of the brain and surrounding structures were obtained without intravenous contrast. COMPARISON:  MRI May 28, 2020. FINDINGS: Brain: No acute infarction, hemorrhage, hydrocephalus, extra-axial collection or mass lesion. Remote right MCA territory infarcts involving the insula and overlying operculum. A couple small T2/FLAIR hyperintensities the white matter, nonspecific but both  chronic microvascular disease. As before, the cerebellar tonsils extend 5-6 mm below the level of the foramen magnum Vascular: Major arterial flow voids are maintained at the skull base. Further evaluated on same day CTA. Skull and upper cervical spine: Normal marrow signal. Sinuses/Orbits: Mild paranasal sinus mucosal thickening with opacified left posterior ethmoid air cell and bilateral inferior maxillary sinus retention cysts. Other: No mastoid effusions. IMPRESSION: 1. No evidence of acute infarct. 2. Remote right MCA territory infarcts. 3. As before, the cerebellar tonsils extend 5-6 mm below the level of the foramen magnum with mild crowding at the level of the foramen magnum. 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 As noted on the prior, cerebellar tonsillar ectopia may be seen in the setting of idiopathic intracranial hypertension or intracranial hypotension. There is a  partially empty sella turcica, which can also be seen with intracranial hypertension. Electronically Signed   By: Feliberto HartsFrederick S Jones M.D.   On: 03/21/2021 18:12   DG CHEST PORT 1 VIEW  Result Date: 03/21/2021 CLINICAL DATA:  Cough EXAM: PORTABLE CHEST 1 VIEW COMPARISON:  09/16/2019 FINDINGS: The heart size and mediastinal contours are within normal limits. Both lungs are clear. The visualized skeletal structures are unremarkable. IMPRESSION: No active disease. Electronically Signed   By: Sharlet SalinaMichael  Brown M.D.   On: 03/21/2021 20:25   ECHOCARDIOGRAM COMPLETE  Result Date: 03/22/2021    ECHOCARDIOGRAM REPORT   Patient Name:   Christina [name] Date of Exam: 03/22/2021 Medical Rec #:  [id number]       Height:       67.0 in Accession #:    [id number]      Weight:       228.8 lb Date of Birth:  12/21/1980        BSA:          2.141 m Patient Age:    40 years        BP:           123/85 mmHg Patient Gender: F               HR:           88 bpm. Exam Location:  Inpatient Procedure: 2D Echo, Cardiac Doppler and Color Doppler Indications:     Stroke  History:        Patient has prior history of Echocardiogram examinations.                 Stroke.  Sonographer:    Cleatis Polkaaylor Peper Referring Phys: [id number] CAROLYN GUILLOUD IMPRESSIONS  1. Left ventricular ejection fraction, by estimation, is 55 to 60%. The left ventricle has normal function. The left ventricle has no regional wall motion abnormalities. Left ventricular diastolic parameters are consistent with Grade II diastolic dysfunction (pseudonormalization).  2. Right ventricular systolic function is normal. The right ventricular size is normal. Tricuspid regurgitation signal is inadequate for assessing PA pressure.  3. The mitral valve is normal in structure. No evidence of mitral valve regurgitation. No evidence of mitral stenosis.  4. The aortic valve is tricuspid. Aortic valve regurgitation is not visualized. No aortic stenosis is present.  5. The inferior vena cava is normal in size with greater than 50% respiratory variability, suggesting right atrial pressure of 3 mmHg. FINDINGS  Left Ventricle: Left ventricular ejection fraction, by estimation, is 55 to 60%. The left ventricle has normal function. The left ventricle has no regional wall motion abnormalities. The left ventricular internal cavity size was normal in size. There is  no left ventricular hypertrophy. Left ventricular diastolic parameters are consistent with Grade II diastolic dysfunction (pseudonormalization). Right Ventricle: The right ventricular size is normal. No increase in right ventricular wall thickness. Right ventricular systolic function is normal. Tricuspid regurgitation signal is inadequate for assessing PA pressure. Left Atrium: Left atrial size was normal in size. Right Atrium: Right atrial size was normal in size. Pericardium: There is no evidence of pericardial effusion. Mitral Valve: The mitral valve is normal in structure. There is mild calcification of the mitral valve leaflet(s). No evidence of mitral valve  regurgitation. No evidence of mitral valve stenosis. Tricuspid Valve: The tricuspid valve is normal in structure. Tricuspid valve regurgitation is not demonstrated. Aortic Valve: The aortic valve is tricuspid. Aortic valve regurgitation is not visualized. No aortic stenosis is present. Aortic  valve peak gradient measures 5.5 mmHg. Pulmonic Valve: The pulmonic valve was normal in structure. Pulmonic valve regurgitation is not visualized. Aorta: The aortic root is normal in size and structure. Venous: The inferior vena cava is normal in size with greater than 50% respiratory variability, suggesting right atrial pressure of 3 mmHg. IAS/Shunts: No atrial level shunt detected by color flow Doppler.  LEFT VENTRICLE PLAX 2D LVIDd:         4.50 cm     Diastology LVIDs:         2.90 cm     LV e' medial:    6.85 cm/s LV PW:         1.00 cm     LV E/e' medial:  10.9 LV IVS:        0.90 cm     LV e' lateral:   10.80 cm/s LVOT diam:     2.00 cm     LV E/e' lateral: 6.9 LV SV:         59 LV SV Index:   28 LVOT Area:     3.14 cm  LV Volumes (MOD) LV vol d, MOD A2C: 76.2 ml LV vol d, MOD A4C: 85.8 ml LV vol s, MOD A2C: 31.9 ml LV vol s, MOD A4C: 35.6 ml LV SV MOD A2C:     44.3 ml LV SV MOD A4C:     85.8 ml LV SV MOD BP:      47.6 ml RIGHT VENTRICLE             IVC RV Basal diam:  2.90 cm     IVC diam: 1.70 cm RV Mid diam:    1.90 cm RV S prime:     12.20 cm/s TAPSE (M-mode): 1.8 cm LEFT ATRIUM             Index        RIGHT ATRIUM           Index LA diam:        3.30 cm 1.54 cm/m   RA Area:     10.40 cm LA Vol (A2C):   25.6 ml 11.95 ml/m  RA Volume:   19.90 ml  9.29 ml/m LA Vol (A4C):   24.6 ml 11.49 ml/m LA Biplane Vol: 25.1 ml 11.72 ml/m  AORTIC VALVE AV Area (Vmax): 2.87 cm AV Vmax:        117.00 cm/s AV Peak Grad:   5.5 mmHg LVOT Vmax:      107.00 cm/s LVOT Vmean:     75.600 cm/s LVOT VTI:       0.188 m  AORTA Ao Root diam: 3.00 cm Ao Asc diam:  2.70 cm MITRAL VALVE MV Area (PHT): 4.36 cm    SHUNTS MV Decel Time: 174  msec    Systemic VTI:  0.19 m MV E velocity: 74.40 cm/s  Systemic Diam: 2.00 cm MV A velocity: 75.10 cm/s MV E/A ratio:  0.99 Dalton McleanMD Electronically signed by Wilfred Lacy Signature Date/Time: 03/22/2021/2:06:25 PM    Final    CT HEAD CODE STROKE WO CONTRAST  Result Date: 03/21/2021 CLINICAL DATA:  Code stroke.  Neuro deficit, acute, stroke suspected EXAM: CT HEAD WITHOUT CONTRAST TECHNIQUE: Contiguous axial images were obtained from the base of the skull through the vertex without intravenous contrast. RADIATION DOSE REDUCTION: This exam was performed according to the departmental dose-optimization program which includes automated exposure control, adjustment of the mA and/or kV according to patient size and/or use  of iterative reconstruction technique. COMPARISON:  CT May 27, 2020.  MRI May 28, 2020. FINDINGS: Brain: Remote infarcts in the right frontal cortex, insula and temporal lobe. No evidence of acute large vascular territory infarct, acute hemorrhage, mass lesion, hydrocephalus, midline shift. Streak artifact limits evaluation. Partially empty sella. Similar inferior tonsillar descent. Vascular: No definite hyperdense vessel identified. Streak artifact limits evaluation. Skull: No acute fracture. Sinuses/Orbits: Opacification of scattered ethmoid air cells and inferior maxillary sinus retention cysts. No acute orbital findings. Other: No mastoid effusions. ASPECTS [facility] Stroke Program Early CT Score) total score (0-10 with 10 being normal): 10. IMPRESSION: 1. No evidence of acute large vascular territory infarct or acute hemorrhage. ASPECTS is 10. 2. Remote right MCA territory infarcts. 3. Similar inferior cerebellar tonsillar descent. Code stroke imaging results were communicated on 03/21/2021 at 5:29 pm to provider Palikh via secure text paging. Electronically Signed   By: Feliberto Harts M.D.   On: 03/21/2021 17:30   VAS [place] LOWER EXTREMITY VENOUS (DVT)  Result Date: 03/22/2021   Lower Venous DVT Study Patient Name:  Christina [name]  Date of Exam:   03/22/2021 Medical Rec #: [id number]        Accession #:    [id number] Date of Birth: May 02, 1980         Patient Gender: F Patient Age:   45 years Exam Location:  [facility] Procedure:      VAS [place] LOWER EXTREMITY VENOUS (DVT) Referring Phys: Scheryl Marten Kshawn Canal --------------------------------------------------------------------------------  Indications: Stroke.  Comparison Study: 05/28/2020 negative lower extremity venous duplex Performing Technologist: Gertie Fey MHA, RDMS, RVT, RDCS  Examination Guidelines: A complete evaluation includes B-mode imaging, spectral Doppler, color Doppler, and power Doppler as needed of all accessible portions of each vessel. Bilateral testing is considered an integral part of a complete examination. Limited examinations for reoccurring indications may be performed as noted. The reflux portion of the exam is performed with the patient in reverse Trendelenburg.  +---------+---------------+---------+-----------+----------+--------------+  RIGHT     Compressibility Phasicity Spontaneity Properties Thrombus Aging  +---------+---------------+---------+-----------+----------+--------------+  CFV       Full            Yes       Yes                                    +---------+---------------+---------+-----------+----------+--------------+  SFJ       Full                                                             +---------+---------------+---------+-----------+----------+--------------+  FV Prox   Full                                                             +---------+---------------+---------+-----------+----------+--------------+  FV Mid    Full                                                             +---------+---------------+---------+-----------+----------+--------------+  FV Distal Full                                                              +---------+---------------+---------+-----------+----------+--------------+  PFV       Full                                                             +---------+---------------+---------+-----------+----------+--------------+  POP       Full            Yes       Yes                                    +---------+---------------+---------+-----------+----------+--------------+  PTV       Full                                                             +---------+---------------+---------+-----------+----------+--------------+  PERO      Full                                                             +---------+---------------+---------+-----------+----------+--------------+   +---------+---------------+---------+-----------+----------+--------------+  LEFT      Compressibility Phasicity Spontaneity Properties Thrombus Aging  +---------+---------------+---------+-----------+----------+--------------+  CFV       Full            Yes       Yes                                    +---------+---------------+---------+-----------+----------+--------------+  SFJ       Full                                                             +---------+---------------+---------+-----------+----------+--------------+  FV Prox   Full                                                             +---------+---------------+---------+-----------+----------+--------------+  FV Mid    Full                                                             +---------+---------------+---------+-----------+----------+--------------+  FV Distal Full                                                             +---------+---------------+---------+-----------+----------+--------------+  PFV       Full                                                             +---------+---------------+---------+-----------+----------+--------------+  POP       Full            Yes       Yes                                     +---------+---------------+---------+-----------+----------+--------------+  PTV       Full                                                             +---------+---------------+---------+-----------+----------+--------------+  PERO      Full                                                             +---------+---------------+---------+-----------+----------+--------------+     Summary: RIGHT: - There is no evidence of deep vein thrombosis in the lower extremity.  - No cystic structure found in the popliteal fossa.  LEFT: - There is no evidence of deep vein thrombosis in the lower extremity.  - No cystic structure found in the popliteal fossa.  *See table(s) above for measurements and observations. Electronically signed by Orlie Pollen on 03/22/2021 at 6:33:52 PM.    Final    CT ANGIO HEAD NECK W WO CM (CODE STROKE)  Result Date: 03/21/2021 CLINICAL DATA:  Neuro deficit, acute, stroke suspected EXAM: CT ANGIOGRAPHY HEAD AND NECK TECHNIQUE: Multidetector CT imaging of the head and neck was performed using the standard protocol during bolus administration of intravenous contrast. Multiplanar CT image reconstructions and MIPs were obtained to evaluate the vascular anatomy. Carotid stenosis measurements (when applicable) are obtained utilizing NASCET criteria, using the distal internal carotid diameter as the denominator. RADIATION DOSE REDUCTION: This exam was performed according to the departmental dose-optimization program which includes automated exposure control, adjustment of the mA and/or kV according to patient size and/or use of iterative reconstruction technique. CONTRAST:  7mL OMNIPAQUE IOHEXOL 350 MG/ML SOLN COMPARISON:  CTA May 27, 2020. FINDINGS: CTA NECK FINDINGS Aortic arch: Great vessel origins are patent. Right carotid system: Common carotid artery is patent without significant stenosis. There is eccentric intraluminal filling defect within the proximal right ICA, which is not apparent on the  comparison prior CTA. This filling defect also appears to extend into the lumen more superiorly.  No significant (greater than 50%) stenosis. Tortuous ICA. Left carotid system: No evidence of dissection, stenosis (50% or greater) or occlusion. Retropharyngeal course. Tortuous ICA. Vertebral arteries: Mildly right dominant. Proximal left vertebral artery is obscured by streak artifact from pooled venous contrast. Within this limitation, no visible significant (greater than 50%) stenosis. The visible vertebral arteries are patent bilaterally. Skeleton: No acute abnormality on limited assessment. Other neck: No acute abnormality on limited assessment. Upper chest: Visualized lung apices are clear. Review of the MIP images confirms the above findings CTA HEAD FINDINGS Anterior circulation: Bilateral intracranial ICAs are patent without significant stenosis. The proximal bilateral MCAs and ACAs are patent without proximal hemodynamically significant stenosis. Tortuous right M2 MCA branch loops back on itself, but appears to remain patent. No aneurysm identified. Posterior circulation: Bilateral intradural vertebral arteries, basilar artery, and posterior cerebral arteries are patent without proximal hemodynamically significant stenosis. Mild bilateral PCA stenosis. Small left P1 PCA with prominent left posterior communicating artery, anatomic variant. No aneurysm identified. Venous sinuses: As permitted by contrast timing, patent. Review of the MIP images confirms the above findings IMPRESSION: 1. New filling defect in the proximal right ICA in the neck, concerning for intraluminal clot and possible plaque rupture. 2. No large vessel occlusion or proximal hemodynamically significant stenosis in the head or neck. Findings discussed with Dr. Viviann Spare via telephone at 5:32 p.m. Electronically Signed   By: Feliberto Harts M.D.   On: 03/21/2021 17:57     PHYSICAL EXAM  Temp:  [97.8 F (36.6 C)-98.5 F (36.9 C)] 98.2 F  (36.8 C) (02/19 1246) Pulse Rate:  [65-79] 77 (02/19 1246) Resp:  [18-20] 18 (02/19 0325) BP: (116-126)/(72-98) 124/90 (02/19 1246) SpO2:  [98 %-100 %] 100 % (02/19 1246)  General - Well nourished, well developed, in no apparent distress.  Ophthalmologic - fundi not visualized due to noncooperation.  Cardiovascular - Regular rhythm and rate.  Mental Status -  Level of arousal and orientation to time, place, and person were intact. Language including expression, naming, repetition, comprehension was assessed and found intact. Fund of Knowledge was assessed and was intact.  Cranial Nerves II - XII - II - Visual field intact OU. III, IV, VI - Extraocular movements intact. V - Facial sensation intact bilaterally. VII - Facial movement intact bilaterally. VIII - Hearing & vestibular intact bilaterally. X - Palate elevates symmetrically. XI - Chin turning & shoulder shrug intact bilaterally. XII - Tongue protrusion intact. Subjective left tongue tingling.  Motor Strength - The patients strength was normal in all extremities and pronator drift was absent.  Bulk was normal and fasciculations were absent.   Motor Tone - Muscle tone was assessed at the neck and appendages and was normal.  Reflexes - The patients reflexes were symmetrical in all extremities and she had no pathological reflexes.  Sensory - Light touch, temperature/pinprick were assessed and were symmetrical.    Coordination - The patient had normal movements in the hands and feet with no ataxia or dysmetria.  Tremor was absent.  Gait and Station - deferred.   ASSESSMENT/PLAN Christina [name] is a 41 y.o. female with history of diabetes, hypertension, smoker, obesity, stroke in 05/2020 admitted for right facial, arm and leg numbness with left hand numbness.   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 Less likely cardio-embolic source CT head no acute  abnormality, old right MCA infarct. 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 2D Echo EF 55 to 60% LE venous Doppler negative for DVT LDL 34 HgbA1c 5.2 UDS positive for THC Hypercoagulable work up negative so far ANA repeat pending Heparin IV for VTE prophylaxis No antithrombotic prior to admission, was on heparin IV. Now switched to eliquis for 2-3 months and then repeat CTA head and neck, once ICA thrombus resolves, can switch back to antiplatelet.  Patient counseled to be compliant with her antithrombotic medications Ongoing aggressive stroke risk factor management Therapy recommendations: None Disposition: Pending  History of stroke 05/2020 admitted for left-sided weakness, numbness left facial droop, left neglect and right gaze after a sneeze.  Status post tPA.  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  Status post IR with TICI3.  MRI showed right MCA infarct and right MCA/ACA, MCA/PCA watershed infarcts.  EF 55 to 60%.  DVT negative.  TCD bubble study negative.  TEE showed positive bubble but negative color Doppler indicating small PFO.  ANA positive.  LDL 15 and A1c 9.7, patient discharged on aspirin and Crestor 20.  Diabetes HgbA1c 5.2 goal < 7.0 Controlled CBG monitoring SSI PCP follow up  Hypertension Stable Long term BP goal normotensive  Hyperlipidemia Home meds: Crestor 20 LDL 34, goal < 70 Now on Crestor 20 Continue statin at discharge  Other Stroke Risk Factors Cigarette smoker, advised to stop smoking Obesity, Body mass index is 35.84 kg/m.   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  Hypomagnesemia - supplement   Hospital day # 2  Neurology will sign off. Please call with questions. Pt will follow up with  stroke clinic Dr. Pearlean Brownie at [facility] 04/23/21. Thanks for the consult.   Marvel Plan, MD PhD Stroke Neurology 03/24/2021 2:33 PM    To contact Stroke Continuity provider, please refer to [email or web address]. After hours, contact General Neurology

## 2021-03-24 NOTE — Progress Notes (Addendum)
ANTICOAGULATION CONSULT NOTE - Initial Consult  Pharmacy Consult for Apixaban Indication:  ICA thrombus  Allergies  Allergen Reactions   Peanut-Containing Drug Products Anaphylaxis   Lisinopril Other (See Comments)    Lip swelling   Penicillins Rash   Tomato Rash    Fresh tomatoes     Patient Measurements: Height: 5\' 7"  (170.2 cm) Weight: 103.8 kg (228 lb 13.4 oz) IBW/kg (Calculated) : 61.6 kg  Vital Signs: Temp: 98.1 F (36.7 C) (02/19 0807) Temp Source: Oral (02/19 0807) BP: 125/98 (02/19 0807) Pulse Rate: 79 (02/19 0807)  Labs: Recent Labs    03/21/21 1718 03/21/21 1723 03/22/21 0833 03/22/21 1551 03/23/21 0232 03/23/21 1223 03/24/21 0246  HGB 14.2   < > 13.6  --  12.8  --  13.5  HCT 43.7   < > 40.0  --  37.8  --  39.3  PLT 303  --  306  --  292  --  311  APTT 29  --   --   --   --   --   --   LABPROT 14.6  --   --   --   --   --   --   INR 1.1  --   --   --   --   --   --   HEPARINUNFRC  --   --  0.18*   < > 0.42 0.41 0.43  CREATININE 1.03*   < > 0.90  --  0.81  --  0.78   < > = values in this interval not displayed.    Estimated Creatinine Clearance: 118.2 mL/min (by C-G formula based on SCr of 0.78 mg/dL).   Medical History: Past Medical History:  Diagnosis Date   Asthma    Diabetes mellitus without complication (HCC)    Hypertension    Stroke [facility])    Assessment: 41 yo female presents with a history of stroke and is found to have a new filling defect in the proximal right ICA in the neck, concerning for intraluminal clot and possible plaque rupture. The patient is negative for DVT. PTA the patient is not on anticoagulation and has not been compliant with antiplatelet therapy. Pharmacy is consulted to switch the patient from heparin to apixaban.   The patient was therapeutic on heparin for 24 hours, therefore, will start apixaban PO 10 mg BID x 6 days, then transition to apixaban PO 5 mg BID thereafter. Per neurology, apixaban will be continued for  2-3 months then switch to antiplatelet therapy only once the ICA thrombus resolves. There are no documented signs or symptoms of bleeding. Hgb 13.5, platelets 311.  Goal of Therapy:  Monitor platelets by anticoagulation protocol: Yes   Plan:  Stop heparin infusion Initiate apixaban PO 10 mg BID x 6 days followed by apixaban PO 5 mg BID Monitor CBC and for signs and symptoms of bleeding Education patient prior to discharge and assess affordability (TOC consult placed)   20, PharmD, RPh  PGY-2 Pharmacy Resident 03/24/2021 8:31 AM  Please check [email or web address] for unit-specific pharmacy phone numbers.   Addendum: Discussed with neurology, although the patient has an ICA thrombus, apixaban PO 5 mg BID should be adequate. Will modify plan to start the patient at apixaban PO 5 mg BID instead. The patient will be counseled prior to discharge. Apixaban copay is ~$25 per the patient's preferred pharmacy.

## 2021-03-25 ENCOUNTER — Other Ambulatory Visit (HOSPITAL_COMMUNITY): Payer: Self-pay

## 2021-03-25 LAB — BASIC METABOLIC PANEL
Anion gap: 9 (ref 5–15)
BUN: 10 mg/dL (ref 6–20)
CO2: 27 mmol/L (ref 22–32)
Calcium: 9.1 mg/dL (ref 8.9–10.3)
Chloride: 98 mmol/L (ref 98–111)
Creatinine, Ser: 0.85 mg/dL (ref 0.44–1.00)
GFR, Estimated: >60 mL/min (ref >60)
Glucose, Bld: 103 mg/dL — ABNORMAL HIGH (ref 70–99)
Potassium: 3.4 mmol/L — ABNORMAL LOW (ref 3.5–5.1)
Sodium: 134 mmol/L — ABNORMAL LOW (ref 135–145)

## 2021-03-25 LAB — MAGNESIUM: Magnesium: 1.7 mg/dL (ref 1.7–2.4)

## 2021-03-25 LAB — ANCA TITERS
Atypical P-ANCA titer: <1:20 {titer}
C-ANCA: <1:20 {titer}
P-ANCA: <1:20 {titer}

## 2021-03-25 LAB — GLUCOSE, CAPILLARY
Glucose-Capillary: 100 mg/dL — ABNORMAL HIGH (ref 70–99)
Glucose-Capillary: 108 mg/dL — ABNORMAL HIGH (ref 70–99)

## 2021-03-25 MED ORDER — POTASSIUM CHLORIDE CRYS ER 20 MEQ PO TBCR
40.0000 meq | EXTENDED_RELEASE_TABLET | Freq: Once | ORAL | Status: AC
  Administered 2021-03-25: 40 meq via ORAL
  Filled 2021-03-25: qty 2

## 2021-03-25 MED ORDER — FLUTICASONE PROPIONATE 50 MCG/ACT NA SUSP: 2.0000 | Freq: Every day | NASAL | 2 refills | Status: DC

## 2021-03-25 MED ORDER — APIXABAN 5 MG PO TABS: 5.0000 mg | ORAL_TABLET | Freq: Two times a day (BID) | ORAL | 0 refills | Status: DC

## 2021-03-25 MED ORDER — ROSUVASTATIN CALCIUM 20 MG PO TABS: 20.0000 mg | ORAL_TABLET | Freq: Every day | ORAL | 0 refills | Status: AC

## 2021-03-25 MED ORDER — MAGNESIUM SULFATE 2 GM/50ML IV SOLN
2.0000 g | Freq: Once | INTRAVENOUS | Status: AC
  Administered 2021-03-25: 2 g via INTRAVENOUS
  Filled 2021-03-25: qty 50

## 2021-03-25 MED ORDER — APIXABAN 5 MG PO TABS
5.0000 mg | ORAL_TABLET | Freq: Two times a day (BID) | ORAL | 0 refills | Status: DC
  Filled 2021-03-25: qty 60, 30d supply, fill #0

## 2021-03-25 NOTE — Discharge Summary (Addendum)
Name: Christina [name] MRN: [id number] DOB: 10-31-1980 41 y.o. PCP: Debroah Loop, PA-C  Date of Admission: 03/21/2021  5:14 PM Date of Discharge: 03/25/21 Attending Physician: Lucious Groves, DO  Discharge Diagnosis: 1. 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 Hypokalemia 3. Type 2 diabetes 4. HTN 5. HLD  Discharge Medications: Allergies as of 03/25/2021       Reactions   Peanut-containing Drug Products Anaphylaxis   Lisinopril Other (See Comments)   Lip swelling   Penicillins Rash   Tomato Rash   Fresh tomatoes         Medication List     STOP taking these medications    aspirin 81 MG EC tablet   chlorthalidone 25 MG tablet Commonly known as: HYGROTON   fexofenadine 180 MG tablet Commonly known as: ALLEGRA   GOODY HEADACHE PO   hydrochlorothiazide 25 MG tablet Commonly known as: HYDRODIURIL       TAKE these medications    albuterol 108 (90 Base) MCG/ACT inhaler Commonly known as: VENTOLIN HFA Inhale 2 puffs into the lungs every 6 (six) hours as needed for wheezing.   apixaban 5 MG Tabs tablet Commonly known as: ELIQUIS Take 1 tablet (5 mg total) by mouth 2 (two) times daily.   Breo Ellipta 100-25 MCG/ACT Aepb Generic drug: fluticasone furoate-vilanterol Inhale 1 puff into the lungs daily.   ergocalciferol 1.25 MG (50000 UT) capsule Commonly known as: VITAMIN D2 Take 50,000 Units by mouth every Monday.   fluticasone 50 MCG/ACT nasal spray Commonly known as: FLONASE Place 2 sprays into both nostrils daily.   insulin glargine 100 UNIT/ML injection Commonly known as: LANTUS Inject 34 Units into the skin daily as needed (high sugar).   loratadine 10 MG tablet Commonly known as: CLARITIN Take 10 mg by mouth daily.   metFORMIN 500 MG 24 hr tablet Commonly known as: GLUCOPHAGE-XR Take 1,000 mg by mouth daily with breakfast.   metoprolol tartrate 100 MG tablet Commonly known as: LOPRESSOR Take 100 mg by mouth daily.   ondansetron 4 MG disintegrating  tablet Commonly known as: Zofran ODT Take 1 tablet (4 mg total) by mouth every 8 (eight) hours as needed for nausea or vomiting.   Ozempic (1 MG/DOSE) 4 MG/3ML Sopn Generic drug: Semaglutide (1 MG/DOSE) Inject 0.75 mLs into the skin every Monday.   rosuvastatin 20 MG tablet Commonly known as: CRESTOR Take 1 tablet (20 mg total) by mouth daily. Start taking on: March 26, 2021 What changed:  medication strength how much to take Another medication with the same name was removed. Continue taking this medication, and follow the directions you see here.        Disposition and follow-up:   [name] was discharged from [facility] in Stable condition.  At the hospital follow up visit please address:  1.  Follow up with PCP- reassess electrolytes, potassium level; stop taking chlorthalidone in the setting of electrolyte imbalance; recheck BP, consider alternative treatment such as CCB; Check CBC, since started on anticoagulation. Follow up with Neurology to assess resolution of ICA thrombus  2.  Labs / imaging needed at time of follow-up: BMP- K+ levels; CBC  3.  Pending labs/ test needing follow-up: Aldosterone to renin ratio  Follow-up Appointments:  Follow-up Information     Garvin Fila, MD. Go on 04/23/2021.   Specialties: Neurology, Radiology Why: stroke clinic Contact information: [address] [place] [address] [phone]  Hospital Course by problem list: 1. Patient presented to the ED c/o acute onset right facial droop, right hemiparesis and right sensory deficit. She was found to have right ICA thrombosis evidenced on CTA head and neck and MRI of head. Neurology was consulted and conducted TIA workup. Patient was loaded with ASA 325mg  then started on daily ASA 81mg  daily. Pt was continued on high intensity statin (rosuvastatin) which is her home medication. A1c well controlled at 5.2% and LDL well  controlled at 34. Due to intermittent numbness of the face and tongue patient placed on Heparin ggt. Patient tolerated well. Echocardiogram: EF 55 to 60% no valvulopathy noted, small PFO. VAS U/S of lower extremity showed no evidence of DVT bilaterally. An autoimmune work up was obtained due to determine etiology of recurrent CVA. Autoimmune results pending. Patient neurologic deficits improved during hospitalization. 5/5 strength upper and lower extremities bilaterally. Sensation intact. No facial asymmetry or slurring of speech. Patient however, endorsed continued intermittent numbness of the tongue. Patient also found to have critical lab of hypokalemia ~2.6 which was repleted. This hypokalemic episode occurred twice during hospitalization. Additional studies were obtained, urine K+/Na and aldosterone to renin ration. No K+ wasting noted on urine K+/Na. Aldosterone to renin ratio pending. Patient BP remained controlled throughout hospitalization w/o medication. To manage her T2DM patient received long acting insulin nightly and SSI w/ meals; glucose readings were appropriate. Patient was medically stable at time of discharge.   Discharge Exam:   BP (!) 129/97 (BP Location: Left Arm)    Pulse 95    Temp 98.5 F (36.9 C) (Oral)    Resp 20    Ht 5\' 7"  (1.702 m)    Wt 103.8 kg    SpO2 96%    BMI 35.84 kg/m  Discharge exam: Physical Exam Constitutional:      General: She is awake. She is not in acute distress. Cardiovascular:     Rate and Rhythm: Normal rate and regular rhythm.     Heart sounds: Normal heart sounds.  Pulmonary:     Effort: Pulmonary effort is normal.     Breath sounds: Normal breath sounds and air entry.  Skin:    General: Skin is warm and dry.  Neurological:     General: No focal deficit present.     Mental Status: She is alert and oriented to person, place, and time.     Cranial Nerves: No dysarthria or facial asymmetry.     Sensory: Sensation is intact.     Motor: Motor  function is intact.  Psychiatric:        Speech: Speech normal.        Behavior: Behavior normal. Behavior is cooperative.        Cognition and Memory: Cognition normal.     Pertinent Labs, Studies, and Procedures:  CBC Latest Ref Rng & Units 03/24/2021 03/23/2021 03/22/2021  WBC 4.0 - 10.5 K/uL 6.5 4.5 4.8  Hemoglobin 12.0 - 15.0 g/dL 13.5 12.8 13.6  Hematocrit 36.0 - 46.0 % 39.3 37.8 40.0  Platelets 150 - 400 K/uL 311 292 306   BMP Latest Ref Rng & Units 03/25/2021 03/24/2021 03/23/2021  Glucose 70 - 99 mg/dL 103(H) 88 -  BUN 6 - 20 mg/dL 10 8 -  Creatinine 0.44 - 1.00 mg/dL 0.85 0.78 -  Sodium 135 - 145 mmol/L 134(L) 134(L) -  Potassium 3.5 - 5.1 mmol/L 3.4(L) 3.6 3.7  Chloride 98 - 111 mmol/L 98 98 -  CO2 22 - 32  mmol/L 27 29 -  Calcium 8.9 - 10.3 mg/dL 9.1 9.0 -   CT HEAD WO CONTRAST (5MM)  Result Date: 03/22/2021 CLINICAL DATA:  Code stroke follow-up EXAM: CT HEAD WITHOUT CONTRAST TECHNIQUE: Contiguous axial images were obtained from the base of the skull through the vertex without intravenous contrast. RADIATION DOSE REDUCTION: This exam was performed according to the departmental dose-optimization program which includes automated exposure control, adjustment of the mA and/or kV according to patient size and/or use of iterative reconstruction technique. COMPARISON:  03/21/2021 FINDINGS: Brain: No acute hemorrhage. There is periventricular hypoattenuation compatible with chronic microvascular disease. Unchanged appearance of old right MCA territory infarcts. Vascular: No abnormal hyperdensity of the major intracranial arteries or dural venous sinuses. No intracranial atherosclerosis. Skull: The visualized skull base, calvarium and extracranial soft tissues are normal. Sinuses/Orbits: No fluid levels or advanced mucosal thickening of the visualized paranasal sinuses. No mastoid or middle ear effusion. The orbits are normal. IMPRESSION: 1. No acute intracranial abnormality. 2. 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 Electronically Signed   By: Ulyses Jarred M.D.   On: 03/22/2021 01:07   MR ANGIO NECK W WO CONTRAST  Result Date: 03/21/2021 CLINICAL DATA:  Abnormal CTA with filling defect right ICA EXAM: MRA NECK WITHOUT AND WITH CONTRAST TECHNIQUE: Multiplanar and multiecho pulse sequences of the neck were obtained without and with intravenous contrast. Angiographic images of the neck were obtained using MRA technique without and with intravenous contrast. CONTRAST:  42mL GADAVIST GADOBUTROL 1 MMOL/ML IV SOLN COMPARISON:  CTA head and neck 03/21/2021 FINDINGS: MRI degraded by motion. MRA confirms a filling defect in the proximal right ICA causing mild narrowing of the lumen. Remainder of the right internal carotid artery normal Left carotid bifurcation normal without stenosis or irregularity Both vertebral arteries normal and widely patent. Antegrade flow in the carotid and vertebral arteries bilaterally. IMPRESSION: Filling defect in the proximal right ICA causing mild luminal stenosis. This confirms the finding on CT angiogram. This could be due to underlying plaque rupture or dissection. These results were called by telephone at the time of interpretation on 03/21/2021 at 6:55 pm to provider Palikh, who verbally acknowledged these results. Electronically Signed   By: Franchot Gallo M.D.   On: 03/21/2021 18:56   MR BRAIN WO CONTRAST  Result Date: 03/21/2021 CLINICAL DATA:  Neuro deficit, acute, stroke suspected EXAM: MRI HEAD WITHOUT CONTRAST TECHNIQUE: Multiplanar, multiecho pulse sequences of the brain and surrounding structures were obtained without intravenous contrast. COMPARISON:  MRI May 28, 2020. FINDINGS: Brain: No acute infarction, hemorrhage, hydrocephalus, extra-axial collection or mass lesion. Remote right MCA territory infarcts involving the insula and overlying operculum. A couple small T2/FLAIR hyperintensities the white matter,  nonspecific but both chronic microvascular disease. As before, the cerebellar tonsils extend 5-6 mm below the level of the foramen magnum Vascular: Major arterial flow voids are maintained at the skull base. Further evaluated on same day CTA. Skull and upper cervical spine: Normal marrow signal. Sinuses/Orbits: Mild paranasal sinus mucosal thickening with opacified left posterior ethmoid air cell and bilateral inferior maxillary sinus retention cysts. Other: No mastoid effusions. IMPRESSION: 1. No evidence of acute infarct. 2. Remote right MCA territory infarcts. 3. As before, the cerebellar tonsils extend 5-6 mm below the level of the foramen magnum with mild crowding at the level of the foramen magnum. 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 As noted on the prior, cerebellar tonsillar ectopia may be seen in the setting  of idiopathic intracranial hypertension or intracranial hypotension. There is a partially empty sella turcica, which can also be seen with intracranial hypertension. Electronically Signed   By: Margaretha Sheffield M.D.   On: 03/21/2021 18:12   DG CHEST PORT 1 VIEW  Result Date: 03/21/2021 CLINICAL DATA:  Cough EXAM: PORTABLE CHEST 1 VIEW COMPARISON:  09/16/2019 FINDINGS: The heart size and mediastinal contours are within normal limits. Both lungs are clear. The visualized skeletal structures are unremarkable. IMPRESSION: No active disease. Electronically Signed   By: Randa Ngo M.D.   On: 03/21/2021 20:25   ECHOCARDIOGRAM COMPLETE  Result Date: 03/22/2021    ECHOCARDIOGRAM REPORT   Patient Name:   SEINA BACILIO Date of Exam: 03/22/2021 Medical Rec #:  [id number]       Height:       67.0 in Accession #:    [id number]      Weight:       228.8 lb Date of Birth:  August 12, 1980        BSA:          2.141 m Patient Age:    34 years        BP:           123/85 mmHg Patient Gender: F               HR:           88 bpm. Exam Location:  Inpatient Procedure: 2D Echo, Cardiac Doppler and Color  Doppler Indications:    Stroke  History:        Patient has prior history of Echocardiogram examinations.                 Stroke.  Sonographer:    Jyl Heinz Referring Phys: [id number] [place]  1. Left ventricular ejection fraction, by estimation, is 55 to 60%. The left ventricle has normal function. The left ventricle has no regional wall motion abnormalities. Left ventricular diastolic parameters are consistent with Grade II diastolic dysfunction (pseudonormalization).  2. Right ventricular systolic function is normal. The right ventricular size is normal. Tricuspid regurgitation signal is inadequate for assessing PA pressure.  3. The mitral valve is normal in structure. No evidence of mitral valve regurgitation. No evidence of mitral stenosis.  4. The aortic valve is tricuspid. Aortic valve regurgitation is not visualized. No aortic stenosis is present.  5. The inferior vena cava is normal in size with greater than 50% respiratory variability, suggesting right atrial pressure of 3 mmHg. FINDINGS  Left Ventricle: Left ventricular ejection fraction, by estimation, is 55 to 60%. The left ventricle has normal function. The left ventricle has no regional wall motion abnormalities. The left ventricular internal cavity size was normal in size. There is  no left ventricular hypertrophy. Left ventricular diastolic parameters are consistent with Grade II diastolic dysfunction (pseudonormalization). Right Ventricle: The right ventricular size is normal. No increase in right ventricular wall thickness. Right ventricular systolic function is normal. Tricuspid regurgitation signal is inadequate for assessing PA pressure. Left Atrium: Left atrial size was normal in size. Right Atrium: Right atrial size was normal in size. Pericardium: There is no evidence of pericardial effusion. Mitral Valve: The mitral valve is normal in structure. There is mild calcification of the mitral valve leaflet(s). No evidence of  mitral valve regurgitation. No evidence of mitral valve stenosis. Tricuspid Valve: The tricuspid valve is normal in structure. Tricuspid valve regurgitation is not demonstrated. Aortic Valve: The aortic valve is tricuspid. Aortic valve  regurgitation is not visualized. No aortic stenosis is present. Aortic valve peak gradient measures 5.5 mmHg. Pulmonic Valve: The pulmonic valve was normal in structure. Pulmonic valve regurgitation is not visualized. Aorta: The aortic root is normal in size and structure. Venous: The inferior vena cava is normal in size with greater than 50% respiratory variability, suggesting right atrial pressure of 3 mmHg. IAS/Shunts: No atrial level shunt detected by color flow Doppler.  LEFT VENTRICLE PLAX 2D LVIDd:         4.50 cm     Diastology LVIDs:         2.90 cm     LV e' medial:    6.85 cm/s LV PW:         1.00 cm     LV E/e' medial:  10.9 LV IVS:        0.90 cm     LV e' lateral:   10.80 cm/s LVOT diam:     2.00 cm     LV E/e' lateral: 6.9 LV SV:         59 LV SV Index:   28 LVOT Area:     3.14 cm  LV Volumes (MOD) LV vol d, MOD A2C: 76.2 ml LV vol d, MOD A4C: 85.8 ml LV vol s, MOD A2C: 31.9 ml LV vol s, MOD A4C: 35.6 ml LV SV MOD A2C:     44.3 ml LV SV MOD A4C:     85.8 ml LV SV MOD BP:      47.6 ml RIGHT VENTRICLE             IVC RV Basal diam:  2.90 cm     IVC diam: 1.70 cm RV Mid diam:    1.90 cm RV S prime:     12.20 cm/s TAPSE (M-mode): 1.8 cm LEFT ATRIUM             Index        RIGHT ATRIUM           Index LA diam:        3.30 cm 1.54 cm/m   RA Area:     10.40 cm LA Vol (A2C):   25.6 ml 11.95 ml/m  RA Volume:   19.90 ml  9.29 ml/m LA Vol (A4C):   24.6 ml 11.49 ml/m LA Biplane Vol: 25.1 ml 11.72 ml/m  AORTIC VALVE AV Area (Vmax): 2.87 cm AV Vmax:        117.00 cm/s AV Peak Grad:   5.5 mmHg LVOT Vmax:      107.00 cm/s LVOT Vmean:     75.600 cm/s LVOT VTI:       0.188 m  AORTA Ao Root diam: 3.00 cm Ao Asc diam:  2.70 cm MITRAL VALVE MV Area (PHT): 4.36 cm    SHUNTS MV  Decel Time: 174 msec    Systemic VTI:  0.19 m MV E velocity: 74.40 cm/s  Systemic Diam: 2.00 cm MV A velocity: 75.10 cm/s MV E/A ratio:  0.99 Dalton McleanMD Electronically signed by Franki Monte Signature Date/Time: 03/22/2021/2:06:25 PM    Final    CT HEAD CODE STROKE WO CONTRAST  Result Date: 03/21/2021 CLINICAL DATA:  Code stroke.  Neuro deficit, acute, stroke suspected EXAM: CT HEAD WITHOUT CONTRAST TECHNIQUE: Contiguous axial images were obtained from the base of the skull through the vertex without intravenous contrast. RADIATION DOSE REDUCTION: This exam was performed according to the departmental dose-optimization program which includes automated exposure control, adjustment of  the mA and/or kV according to patient size and/or use of iterative reconstruction technique. COMPARISON:  CT May 27, 2020.  MRI May 28, 2020. FINDINGS: Brain: Remote infarcts in the right frontal cortex, insula and temporal lobe. No evidence of acute large vascular territory infarct, acute hemorrhage, mass lesion, hydrocephalus, midline shift. Streak artifact limits evaluation. Partially empty sella. Similar inferior tonsillar descent. Vascular: No definite hyperdense vessel identified. Streak artifact limits evaluation. Skull: No acute fracture. Sinuses/Orbits: Opacification of scattered ethmoid air cells and inferior maxillary sinus retention cysts. No acute orbital findings. Other: No mastoid effusions. ASPECTS [facility] Stroke Program Early CT Score) total score (0-10 with 10 being normal): 10. IMPRESSION: 1. No evidence of acute large vascular territory infarct or acute hemorrhage. ASPECTS is 10. 2. Remote right MCA territory infarcts. 3. Similar inferior cerebellar tonsillar descent. Code stroke imaging results were communicated on 03/21/2021 at 5:29 pm to provider Palikh via secure text paging. Electronically Signed   By: Margaretha Sheffield M.D.   On: 03/21/2021 17:30   VAS [place] LOWER EXTREMITY VENOUS (DVT)  Result  Date: 03/22/2021  Lower Venous DVT Study Patient Name:  VALEKA MASSAR  Date of Exam:   03/22/2021 Medical Rec #: [id number]        Accession #:    [id number] Date of Birth: [birth year]         Patient Gender: F Patient Age:   32 years Exam Location[facility]  [facility] Procedure:      VAS [place] LOWER EXTREMITY VENOUS (DVT) Referring Phys: Cornelius Moras XU --------------------------------------------------------------------------------  Indications: Stroke.  Comparison Study: 05/28/2020 negative lower extremity venous duplex Performing Technologist: Maudry Mayhew MHA, RDMS, RVT, RDCS  Examination Guidelines: A complete evaluation includes B-mode imaging, spectral Doppler, color Doppler, and power Doppler as needed of all accessible portions of each vessel. Bilateral testing is considered an integral part of a complete examination. Limited examinations for reoccurring indications may be performed as noted. The reflux portion of the exam is performed with the patient in reverse Trendelenburg.  +---------+---------------+---------+-----------+----------+--------------+  RIGHT     Compressibility Phasicity Spontaneity Properties Thrombus Aging  +---------+---------------+---------+-----------+----------+--------------+  CFV       Full            Yes       Yes                                    +---------+---------------+---------+-----------+----------+--------------+  SFJ       Full                                                             +---------+---------------+---------+-----------+----------+--------------+  FV Prox   Full                                                             +---------+---------------+---------+-----------+----------+--------------+  FV Mid    Full                                                             +---------+---------------+---------+-----------+----------+--------------+  FV Distal Full                                                              +---------+---------------+---------+-----------+----------+--------------+  PFV       Full                                                             +---------+---------------+---------+-----------+----------+--------------+  POP       Full            Yes       Yes                                    +---------+---------------+---------+-----------+----------+--------------+  PTV       Full                                                             +---------+---------------+---------+-----------+----------+--------------+  PERO      Full                                                             +---------+---------------+---------+-----------+----------+--------------+   +---------+---------------+---------+-----------+----------+--------------+  LEFT      Compressibility Phasicity Spontaneity Properties Thrombus Aging  +---------+---------------+---------+-----------+----------+--------------+  CFV       Full            Yes       Yes                                    +---------+---------------+---------+-----------+----------+--------------+  SFJ       Full                                                             +---------+---------------+---------+-----------+----------+--------------+  FV Prox   Full                                                             +---------+---------------+---------+-----------+----------+--------------+  FV Mid    Full                                                             +---------+---------------+---------+-----------+----------+--------------+  FV Distal Full                                                             +---------+---------------+---------+-----------+----------+--------------+  PFV       Full                                                             +---------+---------------+---------+-----------+----------+--------------+  POP       Full            Yes       Yes                                     +---------+---------------+---------+-----------+----------+--------------+  PTV       Full                                                             +---------+---------------+---------+-----------+----------+--------------+  PERO      Full                                                             +---------+---------------+---------+-----------+----------+--------------+     Summary: RIGHT: - There is no evidence of deep vein thrombosis in the lower extremity.  - No cystic structure found in the popliteal fossa.  LEFT: - There is no evidence of deep vein thrombosis in the lower extremity.  - No cystic structure found in the popliteal fossa.  *See table(s) above for measurements and observations. Electronically signed by Orlie Pollen on 03/22/2021 at 6:33:52 PM.    Final    CT ANGIO HEAD NECK W WO CM (CODE STROKE)  Result Date: 03/21/2021 CLINICAL DATA:  Neuro deficit, acute, stroke suspected EXAM: CT ANGIOGRAPHY HEAD AND NECK TECHNIQUE: Multidetector CT imaging of the head and neck was performed using the standard protocol during bolus administration of intravenous contrast. Multiplanar CT image reconstructions and MIPs were obtained to evaluate the vascular anatomy. Carotid stenosis measurements (when applicable) are obtained utilizing NASCET criteria, using the distal internal carotid diameter as the denominator. RADIATION DOSE REDUCTION: This exam was performed according to the departmental dose-optimization program which includes automated exposure control, adjustment of the mA and/or kV according to patient size and/or use of iterative reconstruction technique. CONTRAST:  7mL OMNIPAQUE IOHEXOL 350 MG/ML SOLN COMPARISON:  CTA May 27, 2020. FINDINGS: CTA NECK FINDINGS Aortic arch: Great vessel origins are patent. Right carotid system: Common carotid artery is patent without significant stenosis. There is eccentric intraluminal filling defect within the proximal right ICA, which is not apparent on the  comparison prior CTA. This filling defect also appears to extend into the lumen more superiorly.  No significant (greater than 50%) stenosis. Tortuous ICA. Left carotid system: No evidence of dissection, stenosis (50% or greater) or occlusion. Retropharyngeal course. Tortuous ICA. Vertebral arteries: Mildly right dominant. Proximal left vertebral artery is obscured by streak artifact from pooled venous contrast. Within this limitation, no visible significant (greater than 50%) stenosis. The visible vertebral arteries are patent bilaterally. Skeleton: No acute abnormality on limited assessment. Other neck: No acute abnormality on limited assessment. Upper chest: Visualized lung apices are clear. Review of the MIP images confirms the above findings CTA HEAD FINDINGS Anterior circulation: Bilateral intracranial ICAs are patent without significant stenosis. The proximal bilateral MCAs and ACAs are patent without proximal hemodynamically significant stenosis. Tortuous right M2 MCA branch loops back on itself, but appears to remain patent. No aneurysm identified. Posterior circulation: Bilateral intradural vertebral arteries, basilar artery, and posterior cerebral arteries are patent without proximal hemodynamically significant stenosis. Mild bilateral PCA stenosis. Small left P1 PCA with prominent left posterior communicating artery, anatomic variant. No aneurysm identified. Venous sinuses: As permitted by contrast timing, patent. Review of the MIP images confirms the above findings IMPRESSION: 1. New filling defect in the proximal right ICA in the neck, concerning for intraluminal clot and possible plaque rupture. 2. No large vessel occlusion or proximal hemodynamically significant stenosis in the head or neck. Findings discussed with Dr. Reeves Forth via telephone at 5:32 p.m. Electronically Signed   By: Margaretha Sheffield M.D.   On: 03/21/2021 17:57     Discharge Instructions:  Please take Eliquis 5 mg twice a day for  the next 3 months. Neurology recommends that you do not take the aspirin, only the Eliquis.  At your follow-up appointment with neurology, they will determine what blood thinner you need after 3 months. Please follow-up with your primary care doctor; your blood pressure was normal while hospitalized, without being given any blood pressure medications.  Please do not take chlorthalidone or hydrochlorothiazide, as these medications can affect your potassium levels.  Have your primary care doctor discuss alternative options for blood pressure control. Please follow-up with the neurologist in 3 months; your appointment is already scheduled. I have prescribed you Flonase to help with your allergies, if your insurance does not cover it, please pick it up over-the-counter. Please, if you have not already, stop smoking.  Smoking is a huge risk factor for heart disease and stroke.  Please speak with your primary care doctor for options to help you quit.      Discharge Instructions     Call MD for:  difficulty breathing, headache or visual disturbances   Complete by: As directed    Call MD for:  extreme fatigue   Complete by: As directed    Call MD for:  hives   Complete by: As directed    Call MD for:  persistant dizziness or light-headedness   Complete by: As directed    Call MD for:  persistant nausea and vomiting   Complete by: As directed    Call MD for:  redness, tenderness, or signs of infection (pain, swelling, redness, odor or green/yellow discharge around incision site)   Complete by: As directed    Call MD for:  severe uncontrolled pain   Complete by: As directed    Call MD for:  temperature >100.4   Complete by: As directed    Diet - low sodium heart healthy   Complete by: As directed    Increase activity slowly   Complete by: As directed  Signed: Timothy Lasso, MD 03/25/2021, 12:27 PM   Pager: [phone]

## 2021-03-25 NOTE — TOC Benefit Eligibility Note (Signed)
Patient Product/process development scientist completed.    The patient is currently admitted and upon discharge could be taking Eliquis 5 mg.  The current 30 day co-pay is, $25.00.   The patient is insured through [place] Medicare Part D [facility] is the only pharmacy that takes this insurance)    Roland Earl, CPhT Pharmacy Patient Advocate Specialist [facility] Patient Advocate Team Direct Number: [phone]  Fax: [phone]

## 2021-03-25 NOTE — TOC Transition Note (Signed)
Transition of Care [facility]) - CM/SW Discharge Note   Patient Details  Name: Beth Goodlin MRN: [id number] Date of Birth: 1980-07-06  [facility] CM/SW Contact:  Pollie Friar, RN Phone Number: 03/25/2021, 12:48 PM   Clinical Narrative:    CM is discharging home with self care. No f/u per PT/OT.  CM met with the patient and she asked questions about disability. Cm has asked financial counseling to reach out to her and provided them her phone number.  Pt states she lives with spouse and children. She says her spouse works during the daytime. Her 79 year old is in school in daytime and the 39 year old works.  She denies issues with transportation or home medications.  Per benefits check her co pay for Eliquis is $25 at [facility] outpatient pharmacy and she asked that that medication be sent to [facility] outpatient. She is ok with the other meds going to [facility]. Pt states she already has been given the 30 day free and $10 co pay cards for Eliquis.  Pt has transportation home.    Final next level of care: Home/Self Care Barriers to Discharge: No Barriers Identified   Patient Goals and CMS Choice Patient states their goals for this hospitalization and ongoing recovery are:: to be able to go home   Choice offered to / list presented to : NA  Discharge Placement                       Discharge Plan and Services   Discharge Planning Services: CM Consult Post Acute Care Choice: NA          DME Arranged: N/A DME Agency: NA       HH Arranged: NA HH Agency: NA        Social Determinants of Health (SDOH) Interventions     Readmission Risk Interventions No flowsheet data found.

## 2021-03-25 NOTE — Progress Notes (Signed)
Nsg Discharge Note  Admit Date:  03/21/2021 Discharge date: 03/25/2021   [name] to be D/C'd Home per MD order.  AVS completed. Patient/caregiver able to verbalize understanding.  Discharge Medication: Allergies as of 03/25/2021       Reactions   Peanut-containing Drug Products Anaphylaxis   Lisinopril Other (See Comments)   Lip swelling   Penicillins Rash   Tomato Rash   Fresh tomatoes         Medication List     STOP taking these medications    aspirin 81 MG EC tablet   chlorthalidone 25 MG tablet Commonly known as: HYGROTON   fexofenadine 180 MG tablet Commonly known as: ALLEGRA   GOODY HEADACHE PO   hydrochlorothiazide 25 MG tablet Commonly known as: HYDRODIURIL       TAKE these medications    albuterol 108 (90 Base) MCG/ACT inhaler Commonly known as: VENTOLIN HFA Inhale 2 puffs into the lungs every 6 (six) hours as needed for wheezing.   apixaban 5 MG Tabs tablet Commonly known as: ELIQUIS Take 1 tablet (5 mg total) by mouth 2 (two) times daily.   Breo Ellipta 100-25 MCG/ACT Aepb Generic drug: fluticasone furoate-vilanterol Inhale 1 puff into the lungs daily.   ergocalciferol 1.25 MG (50000 UT) capsule Commonly known as: VITAMIN D2 Take 50,000 Units by mouth every Monday.   fluticasone 50 MCG/ACT nasal spray Commonly known as: FLONASE Place 2 sprays into both nostrils daily.   insulin glargine 100 UNIT/ML injection Commonly known as: LANTUS Inject 34 Units into the skin daily as needed (high sugar).   loratadine 10 MG tablet Commonly known as: CLARITIN Take 10 mg by mouth daily.   metFORMIN 500 MG 24 hr tablet Commonly known as: GLUCOPHAGE-XR Take 1,000 mg by mouth daily with breakfast.   metoprolol tartrate 100 MG tablet Commonly known as: LOPRESSOR Take 100 mg by mouth daily.   ondansetron 4 MG disintegrating tablet Commonly known as: Zofran ODT Take 1 tablet (4 mg total) by mouth every 8 (eight) hours as needed for nausea  or vomiting.   Ozempic (1 MG/DOSE) 4 MG/3ML Sopn Generic drug: Semaglutide (1 MG/DOSE) Inject 0.75 mLs into the skin every Monday.   rosuvastatin 20 MG tablet Commonly known as: CRESTOR Take 1 tablet (20 mg total) by mouth daily. Start taking on: March 26, 2021 What changed:  medication strength how much to take Another medication with the same name was removed. Continue taking this medication, and follow the directions you see here.        Discharge Assessment: Vitals:   03/25/21 0902 03/25/21 1156  BP: 120/80 (!) 129/97  Pulse: 98 95  Resp: 20 20  Temp: 98.1 F (36.7 C) 98.5 F (36.9 C)  SpO2: 96% 96%   Skin clean, dry and intact without evidence of skin break down, no evidence of skin tears noted. IV catheter discontinued intact. Site without signs and symptoms of complications - no redness or edema noted at insertion site, patient denies c/o pain - only slight tenderness at site.  Dressing with slight pressure applied.  D/c Instructions-Education: Discharge instructions given to patient/family with verbalized understanding. D/c education completed with patient/family including follow up instructions, medication list, d/c activities limitations if indicated, with other d/c instructions as indicated by MD - patient able to verbalize understanding, all questions fully answered. Patient instructed to return to ED, call 911, or call MD for any changes in condition.  Patient escorted via WC, and D/C home via private auto.  Kizzie Bane, RN 03/25/2021 2:20 PM

## 2021-03-25 NOTE — Progress Notes (Signed)
Patient ready for discharge to home; discharge instructions given and reviewed; patient aware to follow up with neurology and [facility] outpatient pharmacy.  Husband present for discharge; will accompany patient home.

## 2021-03-26 LAB — PHOSPHATIDYLSERINE ANTIBODIES
Phosphatydalserine, IgA: 1 APS Units (ref 0–19)
Phosphatydalserine, IgG: 9 Units (ref 0–30)
Phosphatydalserine, IgM: 12 Units (ref 0–30)

## 2021-03-27 LAB — FANA STAINING PATTERNS: Speckled Pattern: 24529

## 2021-03-27 LAB — ANTINUCLEAR ANTIBODIES, IFA: ANA Ab, IFA: POSITIVE — AB

## 2021-03-28 ENCOUNTER — Other Ambulatory Visit (HOSPITAL_COMMUNITY): Payer: Self-pay

## 2021-03-30 LAB — ALDOSTERONE + RENIN ACTIVITY W/ RATIO
ALDO / PRA Ratio: 2.6 (ref 0.0–30.0)
Aldosterone: 6.7 ng/dL (ref 0.0–30.0)
PRA LC/MS/MS: 2.609 ng/mL/hr (ref 0.167–5.380)

## 2021-04-01 ENCOUNTER — Telehealth (HOSPITAL_COMMUNITY): Payer: Self-pay

## 2021-04-01 ENCOUNTER — Other Ambulatory Visit (HOSPITAL_COMMUNITY): Payer: Self-pay

## 2021-04-01 NOTE — Telephone Encounter (Signed)
Pharmacy Transitions of Care Follow-up Telephone Call  Date of discharge: 03/25/21  Discharge Diagnosis: CVA  How have you been since you were released from the hospital? Patient doing well since discharge, no questions about meds at this time.   Medication changes made at discharge:      START taking: Eliquis (apixaban)  fluticasone (FLONASE)  CHANGE how you take: rosuvastatin (CRESTOR)  STOP taking: aspirin 81 MG EC tablet  chlorthalidone 25 MG tablet (HYGROTON)  fexofenadine 180 MG tablet (ALLEGRA)  GOODY HEADACHE PO  hydrochlorothiazide 25 MG tablet (HYDRODIURIL)   Medication changes verified by the patient? Yes    Medication Accessibility:  Home Pharmacy: No   Was the patient provided with refills on discharged medications? N/A   Have all prescriptions been transferred from [facility] to home pharmacy? No   Is the patient able to afford medications? Has insurance    Medication Review:  APIXABAN (ELIQUIS)  Apixaban 5 mg BID initiated on 03/25/21.  - Discussed importance of taking medication around the same time everyday  - Advised patient of medications to avoid (NSAIDs, ASA)  - Educated that Tylenol (acetaminophen) will be the preferred analgesic to prevent risk of bleeding  - Emphasized importance of monitoring for signs and symptoms of bleeding (abnormal bruising, prolonged bleeding, nose bleeds, bleeding from gums, discolored urine, black tarry stools)  - Advised patient to alert all providers of anticoagulation therapy prior to starting a new medication or having a procedure    Follow-up Appointments:  PCP Hospital f/u appt confirmed? Scheduled to see Dr. Daphine Deutscher on 04/04/21 @ 11:00am.   Specialist Hospital f/u appt confirmed? Scheduled to see Dr. Fredderick Phenix on 04/11/21 @ 8:40am.   If their condition worsens, is the pt aware to call PCP or go to the Emergency Dept.? Yes  Final Patient Assessment: Patient has f/u scheduled and knows to get refills at f/u

## 2021-04-18 ENCOUNTER — Emergency Department (HOSPITAL_COMMUNITY): Payer: BC Managed Care – PPO

## 2021-04-18 ENCOUNTER — Emergency Department (HOSPITAL_COMMUNITY)
Admission: EM | Admit: 2021-04-18 | Discharge: 2021-04-18 | Disposition: A | Discharge status: Home / Self Care | Payer: BC Managed Care – PPO | Attending: Emergency Medicine | Admitting: Emergency Medicine

## 2021-04-18 ENCOUNTER — Encounter (HOSPITAL_COMMUNITY): Payer: Self-pay | Admitting: Emergency Medicine

## 2021-04-18 DIAGNOSIS — Z91010 Allergy to peanuts: Secondary | ICD-10-CM | POA: Insufficient documentation

## 2021-04-18 DIAGNOSIS — J4521 Mild intermittent asthma with (acute) exacerbation: Secondary | ICD-10-CM | POA: Diagnosis not present

## 2021-04-18 DIAGNOSIS — Z20822 Contact with and (suspected) exposure to covid-19: Secondary | ICD-10-CM | POA: Insufficient documentation

## 2021-04-18 DIAGNOSIS — Z7984 Long term (current) use of oral hypoglycemic drugs: Secondary | ICD-10-CM | POA: Insufficient documentation

## 2021-04-18 DIAGNOSIS — Z794 Long term (current) use of insulin: Secondary | ICD-10-CM | POA: Insufficient documentation

## 2021-04-18 DIAGNOSIS — R059 Cough, unspecified: Secondary | ICD-10-CM | POA: Diagnosis present

## 2021-04-18 DIAGNOSIS — Z79899 Other long term (current) drug therapy: Secondary | ICD-10-CM | POA: Insufficient documentation

## 2021-04-18 DIAGNOSIS — Z7901 Long term (current) use of anticoagulants: Secondary | ICD-10-CM | POA: Diagnosis not present

## 2021-04-18 DIAGNOSIS — E119 Type 2 diabetes mellitus without complications: Secondary | ICD-10-CM | POA: Diagnosis not present

## 2021-04-18 LAB — CBC WITH DIFFERENTIAL/PLATELET
Abs Immature Granulocytes: 0.01 10*3/uL (ref 0.00–0.07)
Basophils Absolute: 0 10*3/uL (ref 0.0–0.1)
Basophils Relative: 1 %
Eosinophils Absolute: 0.6 10*3/uL — ABNORMAL HIGH (ref 0.0–0.5)
Eosinophils Relative: 7 %
HCT: 39.5 % (ref 36.0–46.0)
Hemoglobin: 13 g/dL (ref 12.0–15.0)
Immature Granulocytes: 0 %
Lymphocytes Relative: 43 %
Lymphs Abs: 3.5 10*3/uL (ref 0.7–4.0)
MCH: 31.6 pg (ref 26.0–34.0)
MCHC: 32.9 g/dL (ref 30.0–36.0)
MCV: 96.1 fL (ref 80.0–100.0)
Monocytes Absolute: 0.5 10*3/uL (ref 0.1–1.0)
Monocytes Relative: 6 %
Neutro Abs: 3.5 10*3/uL (ref 1.7–7.7)
Neutrophils Relative %: 43 %
Platelets: 315 10*3/uL (ref 150–400)
RBC: 4.11 MIL/uL (ref 3.87–5.11)
RDW: 13 % (ref 11.5–15.5)
WBC: 8.1 10*3/uL (ref 4.0–10.5)
nRBC: 0 % (ref 0.0–0.2)

## 2021-04-18 LAB — BASIC METABOLIC PANEL
Anion gap: 8 (ref 5–15)
BUN: 6 mg/dL (ref 6–20)
CO2: 28 mmol/L (ref 22–32)
Calcium: 8.6 mg/dL — ABNORMAL LOW (ref 8.9–10.3)
Chloride: 102 mmol/L (ref 98–111)
Creatinine, Ser: 0.75 mg/dL (ref 0.44–1.00)
GFR, Estimated: >60 mL/min (ref >60)
Glucose, Bld: 97 mg/dL (ref 70–99)
Potassium: 3.3 mmol/L — ABNORMAL LOW (ref 3.5–5.1)
Sodium: 138 mmol/L (ref 135–145)

## 2021-04-18 LAB — RESP PANEL BY RT-PCR (FLU A&B, COVID) ARPGX2
Influenza A by PCR: NEGATIVE
Influenza B by PCR: NEGATIVE
SARS Coronavirus 2 by RT PCR: NEGATIVE

## 2021-04-18 IMAGING — CR DG CHEST 2V
2 series · 2 of 2 positions shown · non-contrast
Comparison: Chest x-ray dated [DATE].

CLINICAL DATA: Cough, congestion, shortness of breath.

EXAM:
CHEST - 2 VIEW

[chest pa]
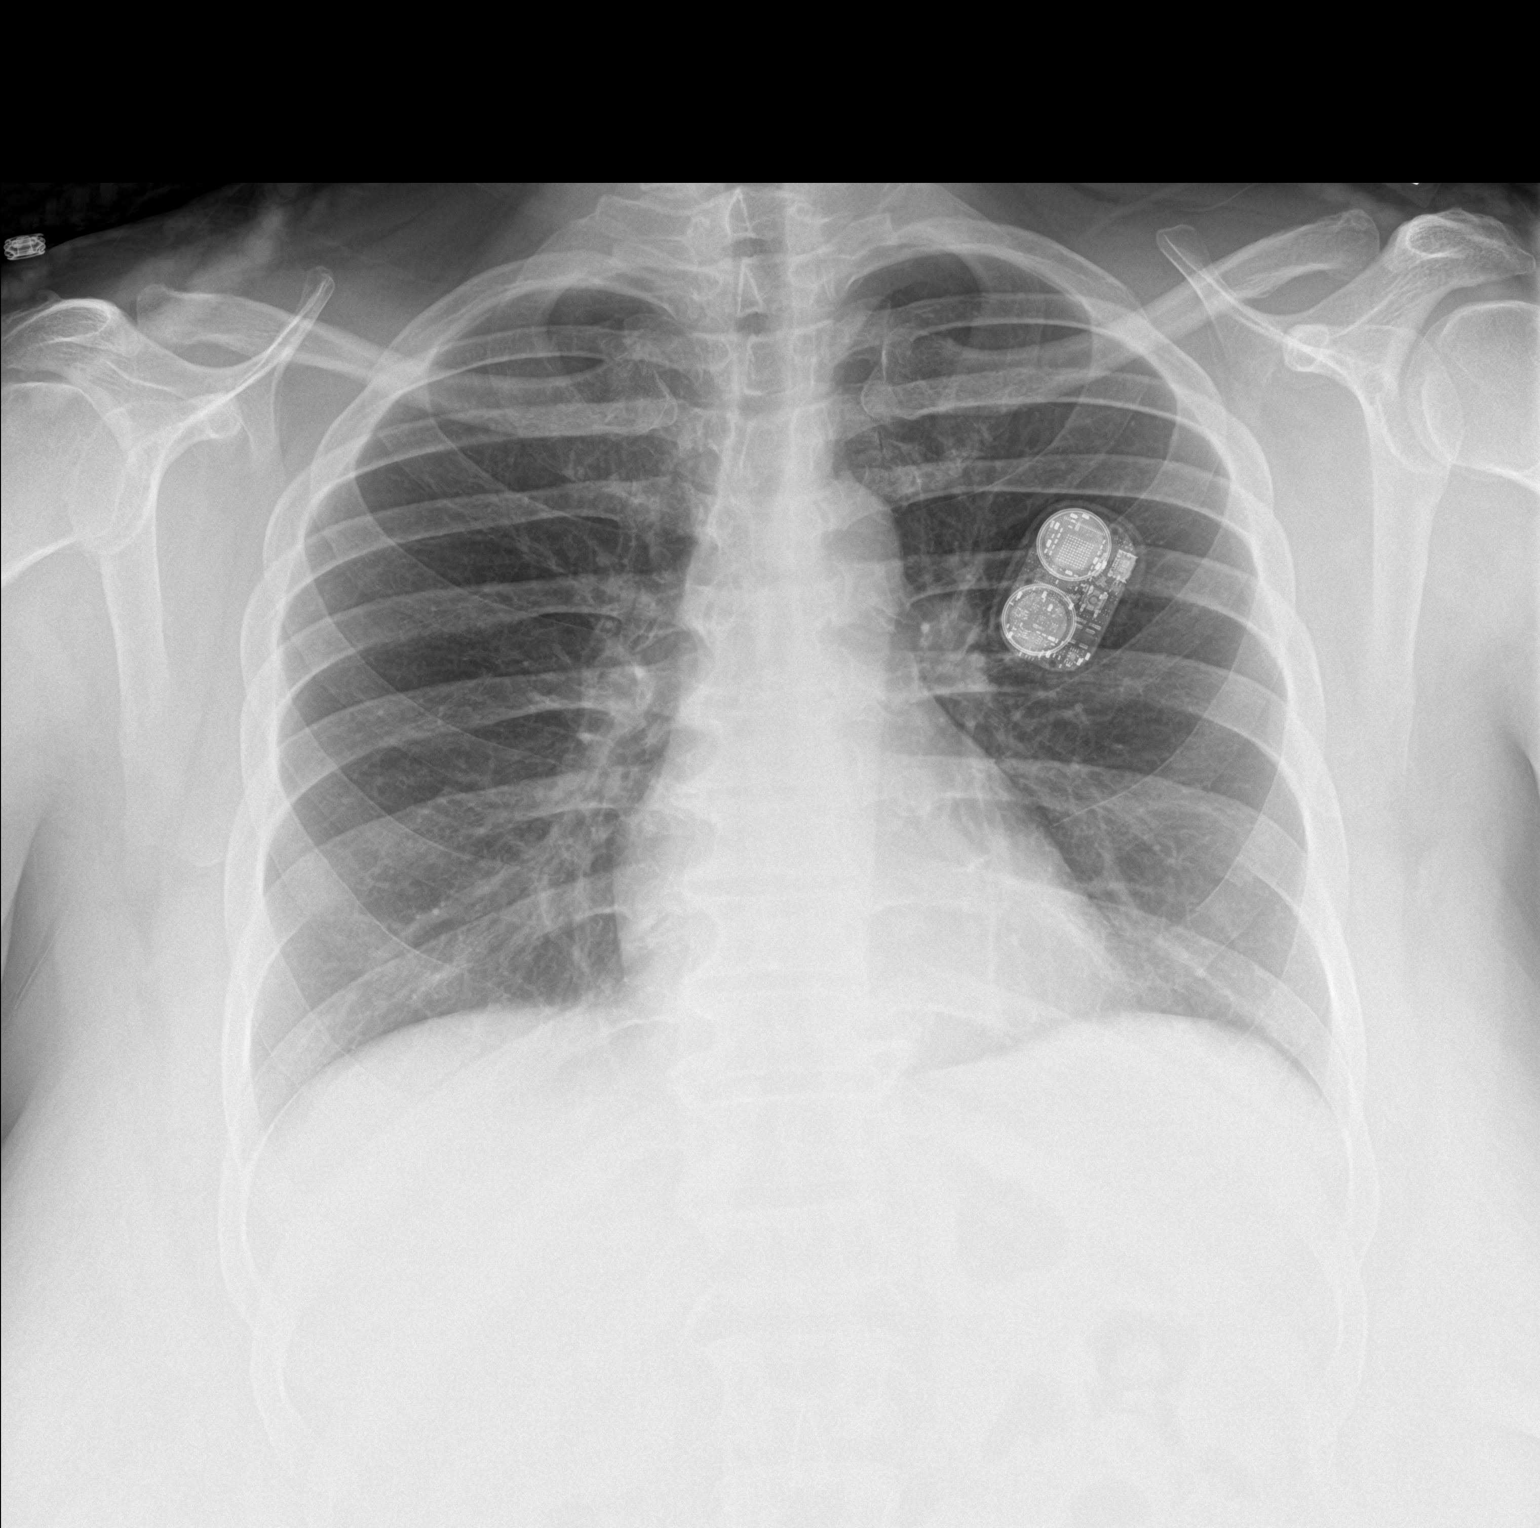

[chest lat]
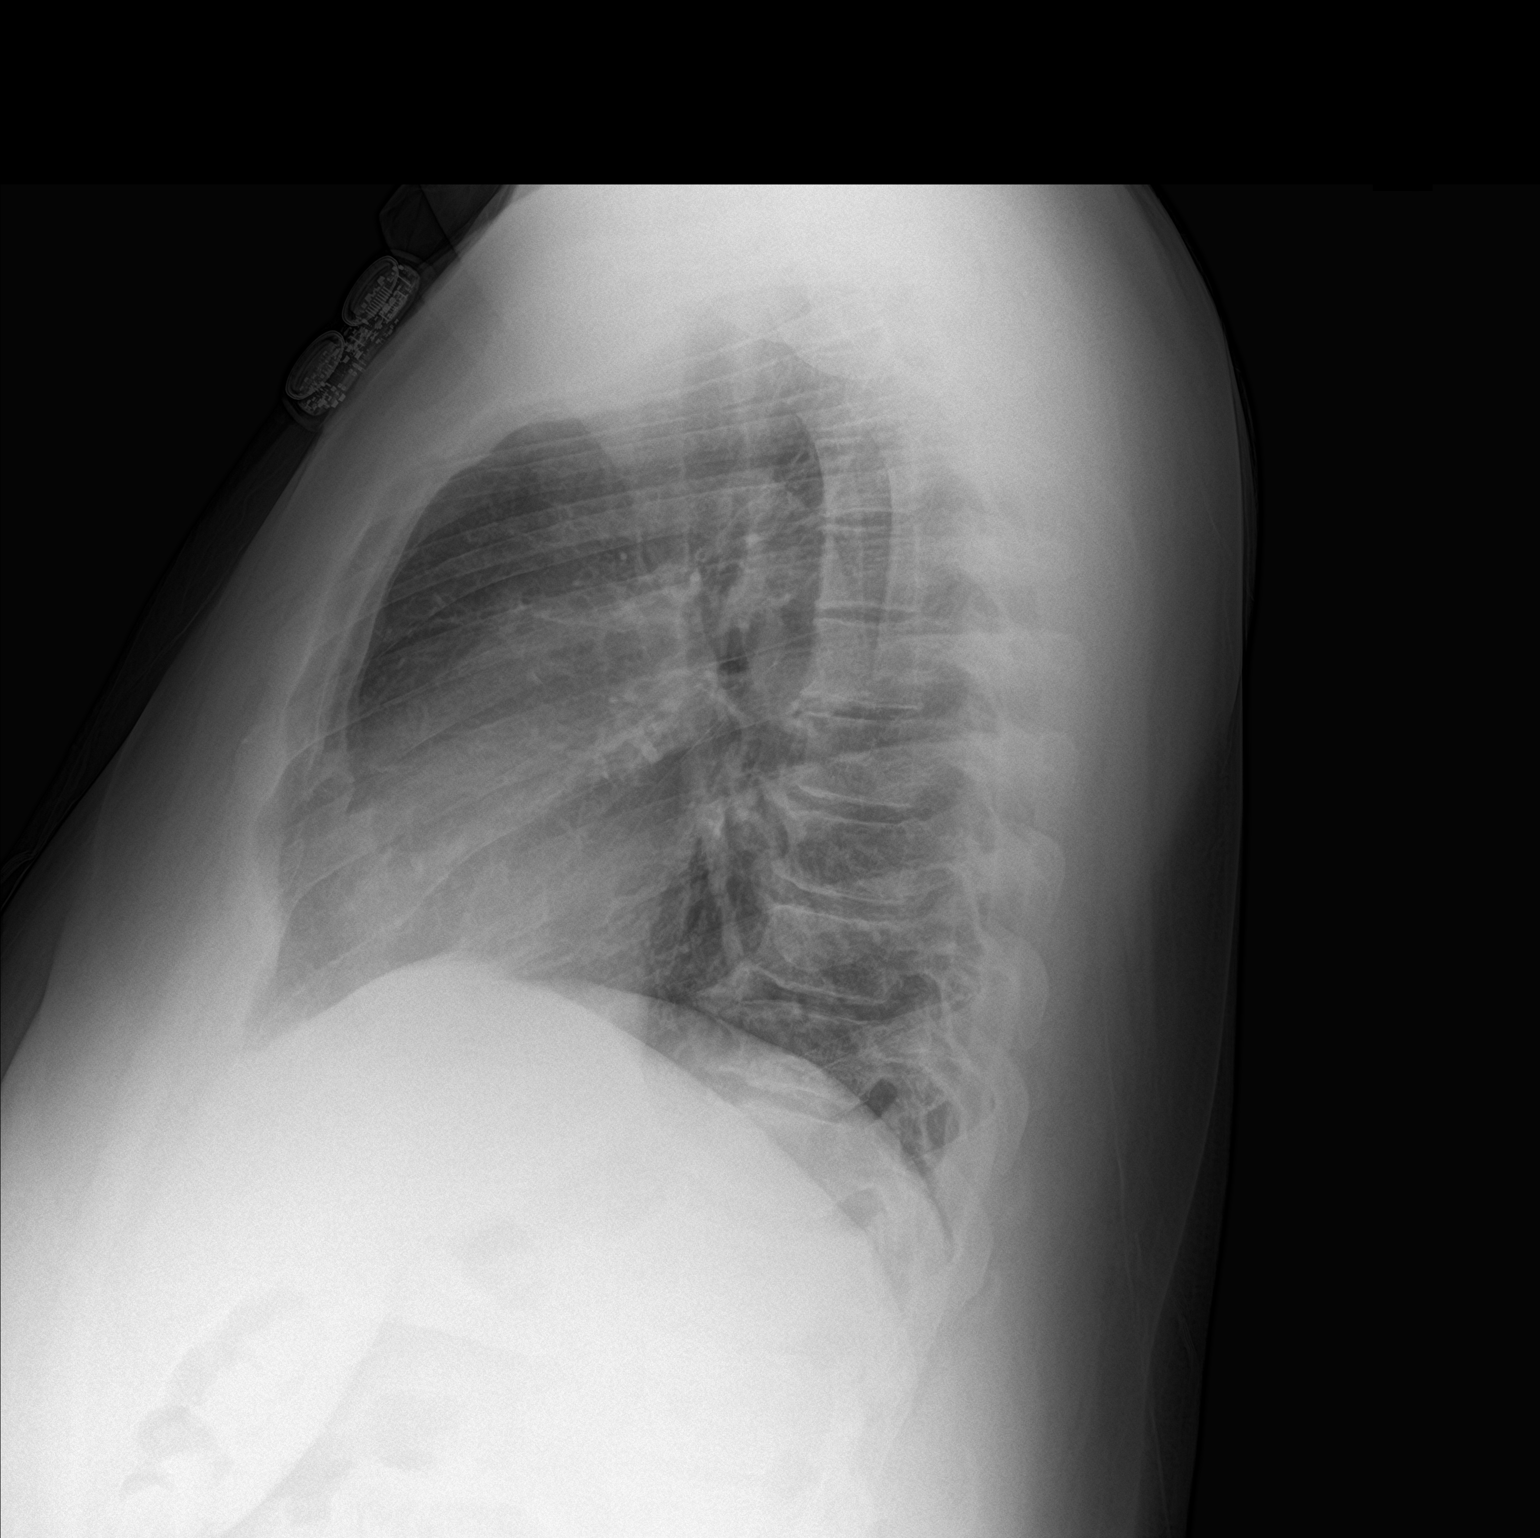

[2 of 2 positions shown; findings below may reference images not displayed]

FINDINGS: The heart size and mediastinal contours are within normal limits.
Normal pulmonary vascularity. No focal consolidation, pleural
effusion, or pneumothorax. No acute osseous abnormality. New loop
recorder overlying the left chest wall.
IMPRESSION: No active cardiopulmonary disease.

## 2021-04-18 MED ORDER — IPRATROPIUM-ALBUTEROL 0.5-2.5 (3) MG/3ML IN SOLN
3.0000 mL | Freq: Once | RESPIRATORY_TRACT | Status: AC
Start: 2021-04-18 — End: 2021-04-18
  Administered 2021-04-18: 3 mL via RESPIRATORY_TRACT
  Filled 2021-04-18: qty 3

## 2021-04-18 MED ORDER — METHYLPREDNISOLONE SODIUM SUCC 125 MG IJ SOLR
125.0000 mg | Freq: Once | INTRAMUSCULAR | Status: AC
  Administered 2021-04-18: 125 mg via INTRAVENOUS
  Filled 2021-04-18: qty 2

## 2021-04-18 MED ORDER — ALBUTEROL SULFATE (2.5 MG/3ML) 0.083% IN NEBU
5.0000 mg | INHALATION_SOLUTION | Freq: Once | RESPIRATORY_TRACT | Status: AC
  Administered 2021-04-18: 5 mg via RESPIRATORY_TRACT
  Filled 2021-04-18: qty 6

## 2021-04-18 MED ORDER — PREDNISONE 50 MG PO TABS: 50.0000 mg | ORAL_TABLET | Freq: Every day | ORAL | 0 refills | Status: DC

## 2021-04-18 NOTE — ED Notes (Signed)
Pt in bed, pt states that she is feeling better and ready to go home, husband at bedside.  Pt from dpt  ?

## 2021-04-18 NOTE — ED Triage Notes (Signed)
Patient with history of asthma complains of a persistent cough since last Tuesday. Patient called her PCP and prescribed prednisone, last dose was yesterday, reports no improvement in symptoms after prednisone so she came to ED for further evaluation. Patient is alert, oriented, ambulatory, speaking in complete sentences, and is in no apparent distress. ?

## 2021-04-18 NOTE — Discharge Instructions (Addendum)
Use your spacer.  Pick up the Advair.  Avoid smoke.  Return if worse.  ?

## 2021-04-18 NOTE — ED Provider Notes (Signed)
?[name] EMERGENCY DEPARTMENT ?Provider Note ? ? ?CSN: [id number] ?Arrival date & time: 04/18/21  1113 ? ?  ? ?History ? ?Chief Complaint  ?Patient presents with  ? Cough  ? ? ?Christina [name] is a 41 y.o. female. ? ?Pt is a 41 yo female with a pmhx of asthma, dm, and CVA.  Pt said she has been sob for over a week.  She called her doctor who called in a rx for a 5 day course of prednisone (20 mg).  They were trying a low dose prednisone due to hx of DM.  Pt opened a new albuterol inhaler on Sunday, 3/12 and she's already ran through half of the inhaler (over 100 puffs).  However, she is not using her spacer.  Pt's last dose of prednisone was yesterday.  She still feels very sob and has been coughing a lot. She denies fevers.   ? ? ?  ? ?Home Medications ?Prior to Admission medications   ?Medication Sig Start Date End Date Taking? Authorizing Provider  ?predniSONE (DELTASONE) 50 MG tablet Take 1 tablet (50 mg total) by mouth daily with breakfast. 04/18/21  Yes Jacalyn Lefevre, MD  ?albuterol (PROVENTIL HFA;VENTOLIN HFA) 108 (90 BASE) MCG/ACT inhaler Inhale 2 puffs into the lungs every 6 (six) hours as needed for wheezing.    [provider]  ?apixaban (ELIQUIS) 5 MG TABS tablet Take 1 tablet (5 mg total) by mouth 2 (two) times daily. 03/25/21 06/23/21  Dellis Filbert, MD  ?Virgel Bouquet ELLIPTA 100-25 MCG/INH AEPB Inhale 1 puff into the lungs daily. ?Patient not taking: Reported on 03/21/2021 08/10/19   [provider]  ?ergocalciferol (VITAMIN D2) 1.25 MG (50000 UT) capsule Take 50,000 Units by mouth every Monday.    [provider]  ?fluticasone (FLONASE) 50 MCG/ACT nasal spray Place 2 sprays into both nostrils daily. 03/25/21   Dellis Filbert, MD  ?insulin glargine (LANTUS) 100 UNIT/ML injection Inject 34 Units into the skin daily as needed (high sugar).    [provider]  ?loratadine (CLARITIN) 10 MG tablet Take 10 mg by mouth daily.    [provider]   ?metFORMIN (GLUCOPHAGE-XR) 500 MG 24 hr tablet Take 1,000 mg by mouth daily with breakfast.    [provider]  ?metoprolol tartrate (LOPRESSOR) 100 MG tablet Take 100 mg by mouth daily. 08/11/19   [provider]  ?ondansetron (ZOFRAN ODT) 4 MG disintegrating tablet Take 1 tablet (4 mg total) by mouth every 8 (eight) hours as needed for nausea or vomiting. 09/16/19   Alvira Monday, MD  ?[facility], 1 MG/DOSE, 4 MG/3ML SOPN Inject 0.75 mLs into the skin every Monday. 05/18/20   [provider]  ?rosuvastatin (CRESTOR) 20 MG tablet Take 1 tablet (20 mg total) by mouth daily. 03/26/21 06/24/21  Dellis Filbert, MD  ?   ? ?Allergies    ?Peanut-containing drug products, Lisinopril, Penicillins, and Tomato   ? ?Review of Systems   ?Review of Systems  ?Respiratory:  Positive for cough, shortness of breath and wheezing.   ?All other systems reviewed and are negative. ? ?Physical Exam ?Updated Vital Signs ?BP (!) 142/93 (BP Location: Right Arm)   Pulse (!) 105   Temp 98.1 ?F (36.7 ?C) (Oral)   Resp 18   SpO2 97%  ?Physical Exam ?Vitals and nursing note reviewed.  ?Constitutional:   ?   Appearance: Normal appearance.  ?HENT:  ?   Head: Normocephalic and atraumatic.  ?   Right Ear: External ear normal.  ?  Left Ear: External ear normal.  ?   Nose: Nose normal.  ?   Mouth/Throat:  ?   Mouth: Mucous membranes are moist.  ?   Pharynx: Oropharynx is clear.  ?Eyes:  ?   Extraocular Movements: Extraocular movements intact.  ?   Conjunctiva/sclera: Conjunctivae normal.  ?   Pupils: Pupils are equal, round, and reactive to light.  ?Cardiovascular:  ?   Rate and Rhythm: Normal rate and regular rhythm.  ?   Pulses: Normal pulses.  ?   Heart sounds: Normal heart sounds.  ?Pulmonary:  ?   Breath sounds: Wheezing present.  ?Abdominal:  ?   General: Abdomen is flat. Bowel sounds are normal.  ?Musculoskeletal:     ?   General: Normal range of motion.  ?   Cervical back: Normal range of motion and neck supple.   ?Skin: ?   General: Skin is warm.  ?   Capillary Refill: Capillary refill takes less than 2 seconds.  ?Neurological:  ?   General: No focal deficit present.  ?   Mental Status: She is alert and oriented to person, place, and time.  ?Psychiatric:     ?   Mood and Affect: Mood normal.     ?   Behavior: Behavior normal.  ? ? ?ED Results / Procedures / Treatments   ?Labs ?(all labs ordered are listed, but only abnormal results are displayed) ?Labs Reviewed  ?CBC WITH DIFFERENTIAL/PLATELET - Abnormal; Notable for the following components:  ?    Result Value  ? Eosinophils Absolute 0.6 (*)   ? All other components within normal limits  ?BASIC METABOLIC PANEL - Abnormal; Notable for the following components:  ? Potassium 3.3 (*)   ? Calcium 8.6 (*)   ? All other components within normal limits  ?RESP PANEL BY RT-PCR (FLU A&B, COVID) [id number]  ? ? ?EKG ?None ? ?Radiology ?DG Chest 2 View ? ?Result Date: 04/18/2021 ?CLINICAL DATA:  Cough, congestion, shortness of breath. EXAM: CHEST - 2 VIEW COMPARISON:  Chest x-ray dated March 27, 2021. FINDINGS: The heart size and mediastinal contours are within normal limits. Normal pulmonary vascularity. No focal consolidation, pleural effusion, or pneumothorax. No acute osseous abnormality. New loop recorder overlying the left chest wall. IMPRESSION: No active cardiopulmonary disease. Electronically Signed   By: Obie DredgeWilliam T Derry M.D.   On: 04/18/2021 12:14   ? ?Procedures ?Procedures  ? ? ?Medications Ordered in ED ?Medications  ?methylPREDNISolone sodium succinate (SOLU-MEDROL) 125 mg/2 mL injection 125 mg (125 mg Intravenous Given 04/18/21 1220)  ?ipratropium-albuterol (DUONEB) 0.5-2.5 (3) MG/3ML nebulizer solution 3 mL (3 mLs Nebulization Given 04/18/21 1227)  ?albuterol (PROVENTIL) (2.5 MG/3ML) 0.083% nebulizer solution 5 mg (5 mg Nebulization Given 04/18/21 1358)  ? ? ?ED Course/ Medical Decision Making/ A&P ?  ?                        ?Medical Decision Making ?Amount and/or  Complexity of Data Reviewed ?Labs: ordered. ?Radiology: ordered. ? ?Risk ?Prescription drug management. ? ? ?This patient presents to the ED for concern of sob, this involves an extensive number of treatment options, and is a complaint that carries with it a high risk of complications and morbidity.  The differential diagnosis includes asthma, bronchitis, pna, covid/flu  ? ? ?Co morbidities that complicate the patient evaluation ? ?sthma, dm, and CVA. ? ? ?Additional history obtained: ? ?Additional history obtained from epic chart review ? ? ?Lab Tests: ? ?I  Ordered, and personally interpreted labs.  The pertinent results include:  cbc nl, K is slightly low at 3.3, Covid/f.u neg ? ? ?Imaging Studies ordered: ? ?I ordered imaging studies including cxr  ?I independently visualized and interpreted imaging which showed no pna ?I agree with the radiologist interpretation ? ? ?Cardiac Monitoring: ? ?The patient was maintained on a cardiac monitor.  I personally viewed and interpreted the cardiac monitored which showed an underlying rhythm of: nsr ? ? ?Medicines ordered and prescription drug management: ? ?I ordered medication including duoneb and solumedrol  for asthma  ?Reevaluation of the patient after these medicines showed that the patient improved, but she is still sob.  So, I ordered another 5 mg of albuterol.  That has helped. ?I have reviewed the patients home medicines and have made adjustments as needed ? ? ?Problem List / ED Course: ? ?Asthma exac:  Sob has improved, but required several nebs.  Pt has an advair inhaler that she did not pick up because it was $35.  She is encouraged to pick it up.  I am also going to send her with a rx for prednisone 50 mg daily burst for 5 days.  She knows this can increase her sugar.  She is encouraged to use her spacer.  She does not smoke anymore, but her husband still does.  I encouraged him to stop smoking.  Pt knows to return if worse. ? ? ?Reevaluation: ? ?After the  interventions noted above, I reevaluated the patient and found that they have :improved ? ? ?Social Determinants of Health: ? ?Lives at home with husband ? ? ?Dispostion: ? ?After consideration of the diagnostic results and the

## 2021-04-23 ENCOUNTER — Ambulatory Visit (INDEPENDENT_AMBULATORY_CARE_PROVIDER_SITE_OTHER): Payer: BC Managed Care – PPO | Admitting: Neurology

## 2021-04-23 ENCOUNTER — Encounter: Payer: Self-pay | Admitting: Neurology

## 2021-04-23 ENCOUNTER — Other Ambulatory Visit: Payer: Self-pay

## 2021-04-23 VITALS — BP 139/104 | HR 78 | Ht 67.0 in | Wt 236.0 lb

## 2021-04-23 DIAGNOSIS — I63411 Cerebral infarction due to embolism of right middle cerebral artery: Secondary | ICD-10-CM

## 2021-04-23 DIAGNOSIS — Z8673 Personal history of transient ischemic attack (TIA), and cerebral infarction without residual deficits: Secondary | ICD-10-CM

## 2021-04-23 DIAGNOSIS — I6521 Occlusion and stenosis of right carotid artery: Secondary | ICD-10-CM | POA: Diagnosis not present

## 2021-04-23 MED ORDER — CLOPIDOGREL BISULFATE 75 MG PO TABS: 75.0000 mg | ORAL_TABLET | Freq: Every day | ORAL | 11 refills | Status: DC

## 2021-04-23 NOTE — Progress Notes (Signed)
?[facility] ?[id number] ?[place]. [place] [id number] ?[phone] ? ?     OFFICE FOLLOW-UP NOTE ? ?Christina [name] ?Date of Birth:  01/07/1981 ?Medical Record Number:  [id number]  ? ?HPI: Initial visit 08/14/2020 [name] is a 41 year old African-American lady seen today for initial office follow-up visit following hospital admission for stroke in April 2022.  History is obtained from the patient, review of electronic medical records and I personally reviewed available pertinent imaging films in PACS. ?She has past medical history of diabetes, hypertension, hyperlipidemia and asthma.  She presented on 05/27/2020 to the emergency room with sudden onset of left hemiparesis, left-sided neglect and right gaze preference.  Code stroke was activated and CT angiogram showed right M1 stenosis.  Patient was given IV tPA and went for emergent mechanical thrombectomy which was successfully performed she was kept in the ICU and blood pressure adequately controlled.  Follow-up MRI scan showed small patchy infarcts in the right MCA and right MCA/ACA and PCA watershed territories.  2D echo showed normal ejection fraction with grade 2 diastolic dysfunction.  Transesophageal echo showed no cardiac source of embolism or PFO.  Transcranial Doppler bubble study was negative for PFO.  Lower extremity venous Dopplers was negative for DVT.  Hypercoagulable labs showed normal anticardiolipin antibodies and lupus anticoagulant was negative.  ANA was negative.  Hemoglobin A1c was elevated at 9.7 and LDL cholesterol was low at 15 mg percent.  Patient was advised to quit smoking.  She has placed on dual antiplatelet therapy for 3 weeks followed by aspirin alone.  She is done well.  She states she has no residual deficits from her stroke.  She feels that short-term memory at times may be difficult and she may have trouble with multitasking.  She is tolerating aspirin well without bruising or bleeding.  Blood pressure she  claims is well controlled but today it is elevated in office at 157/109.  She is tolerating Crestor well without muscle aches and pains and states her sugars have all been good.  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  She has no new complaints today.  She is likely at risk for sleep apnea and has not yet had a polysomnogram for sleep evaluation.  There is no family history of strokes or heart attacks at a young age.  She has no history of DVT, pulmonary embolism or migraines. ?Update 04/23/2021 ; patient is seen for follow-up to last visit 8 months ago.  She was [facility]2021 with sudden onset of right face droop as well as body numbness and weakness the symptoms are quite fluctuating.  Her symptoms started improving by the time she reached the ED.  CT head was unremarkable CT angiogram showed a filling defect in the proximal right carotid possibly a clot.  MRI scan showed no acute infarct and borderline Chiari I malformation.  MRI of the neck showed similar finding to CTA suggestive of possible clot.  2D echo showed normal ejection fraction without obvious cardiac source of embolism.  Lower extremity venous Dopplers were negative for DVT.  LDL cholesterol is 34 mg percent.  Hemoglobin A1c was 5.2.  Urine drug screen was positive for cannabis.  Patient was started on IV heparin suspected clot in the carotid and subsequently switched to Eliquis.  Patient had previously stopped aspirin following her stroke in April 2022 in September because of increased menstrual bleeding.  Patient states after she went to the  hospital she was seen in [facility] ER on 03/27/2021 for recurrent left-sided symptoms.  MRI of the brain this time interestingly showed multiple acute infarcts in the right frontal cortex in the right anterior MCA territory with a small foci of susceptibility artifact with T2 hyperintensity compatible with small hemorrhage.  CT scan of the head was done but no  acute hemorrhage was noted.  Also had MR angiogram of the neck and the brain showed complete resolution of the filling defect in the proximal right carotid seen previously.  Patient has remained on Eliquis which is tolerating well without bruising or bleeding.  She plans to see OB/GYN Dr. For a hormone implant to reduce her menstrual bleeding.  Patient had been referred by me for for sleep apnea testing at last visit but this has not yet happened but she has an appointment later this week for the same.  She states her blood pressure is been running up and down and primary care physician has recently added amlodipine.  Today it is 139/104.  Blood sugars have all been under good control.  She has not had any further recurrent episodes of weakness or numbness in the last 1 month. ?ROS:   ?14 system review of systems is positive for diminished fine motor skills, short-term memory difficulties and all the systems negative ? ?PMH:  ?Past Medical History:  ?Diagnosis Date  ? Asthma   ? Diabetes mellitus without complication (HCC)   ? Hypertension   ? Stroke [facility])   ? ? ?Social History:  ?Social History  ? ?Socioeconomic History  ? Marital status: Married  ?  Spouse name: Ternell  ? Number of children: Not on file  ? Years of education: Not on file  ? Highest education level: Not on file  ?Occupational History  ? Not on file  ?Tobacco Use  ? Smoking status: Every Day  ?  Packs/day: 0.25  ?  Types: Cigarettes  ? Smokeless tobacco: Never  ?Vaping Use  ? Vaping Use: Never used  ?Substance and Sexual Activity  ? Alcohol use: Yes  ?  Comment: occ  ? Drug use: No  ? Sexual activity: Not on file  ?Other Topics Concern  ? Not on file  ?Social History Narrative  ? Lives with husband and 3 children  ? Right Handed  ? Drinks caffeine occasionally  ? ?Social Determinants of Health  ? ?Financial Resource Strain: Not on file  ?Food Insecurity: Not on file  ?Transportation Needs: Not on file  ?Physical Activity: Not on file  ?Stress: Not  on file  ?Social Connections: Not on file  ?Intimate Partner Violence: Not on file  ? ? ?Medications:   ?Current Outpatient Medications on File Prior to Visit  ?Medication Sig Dispense Refill  ? albuterol (PROVENTIL HFA;VENTOLIN HFA) 108 (90 BASE) MCG/ACT inhaler Inhale 2 puffs into the lungs every 6 (six) hours as needed for wheezing.    ? amLODipine (NORVASC) 5 MG tablet Take 1 tablet by mouth daily.    ? ergocalciferol (VITAMIN D2) 1.25 MG (50000 UT) capsule Take 50,000 Units by mouth every Monday.    ? loratadine (CLARITIN) 10 MG tablet Take 10 mg by mouth daily.    ? metFORMIN (GLUCOPHAGE-XR) 500 MG 24 hr tablet Take 500 mg by mouth daily with breakfast.    ? metoprolol tartrate (LOPRESSOR) 100 MG tablet Take 100 mg by mouth daily.    ? ondansetron (ZOFRAN ODT) 4 MG disintegrating tablet Take 1 tablet (4 mg total) by  mouth every 8 (eight) hours as needed for nausea or vomiting. 20 tablet 0  ? OZEMPIC, 1 MG/DOSE, 4 MG/3ML SOPN Inject 0.75 mLs into the skin once a week.    ? predniSONE (DELTASONE) 50 MG tablet Take 1 tablet (50 mg total) by mouth daily with breakfast. 5 tablet 0  ? rosuvastatin (CRESTOR) 20 MG tablet Take 1 tablet (20 mg total) by mouth daily. 90 tablet 0  ? ?No current facility-administered medications on file prior to visit.  ? ? ?Allergies:   ?Allergies  ?Allergen Reactions  ? Peanut-Containing Drug Products Anaphylaxis  ? Lisinopril Other (See Comments)  ?  Lip swelling  ? Penicillins Rash  ? Tomato Rash  ?  Fresh tomatoes   ? ? ?Physical Exam ?General: Morbidly obese young African-American lady, seated, in no evident distress ?Head: head normocephalic and atraumatic.  ?Neck: supple with no carotid or supraclavicular bruits ?Cardiovascular: regular rate and rhythm, no murmurs ?Musculoskeletal: no deformity ?Skin:  no rash/petichiae ?Vascular:  Normal pulses all extremities ?Vitals:  ? 04/23/21 1319  ?BP: (!) 139/104  ?Pulse: 78  ? ?Neurologic Exam ?Mental Status: Awake and fully alert.  Oriented to place and time. Recent and remote memory intact. Attention span, concentration and fund of knowledge appropriate. Mood and affect appropriate.  ?Cranial Nerves: Fundoscopic exam not done. Pupils eq

## 2021-04-23 NOTE — Patient Instructions (Signed)
I had a long d/w patient about her recent TIA and stroke, right carotid thrombus versus ulcerated plaque,risk for recurrent stroke/TIAs, personally independently reviewed imaging studies and stroke evaluation results and answered questions.discontinue Eliquis as recent MR angiogram of the neck on 03/27/2021 had shown complete resolution of the right carotid filling defect and she is having heavy menstrual bleeding and switch to Plavix 75 mg daily alone for secondary stroke prevention and maintain strict control of hypertension with blood pressure goal below 130/90, diabetes with hemoglobin A1c goal below 6.5% and lipids with LDL cholesterol goal below 70 mg/dL. I also advised the patient to eat a healthy diet with plenty of whole grains, cereals, fruits and vegetables, exercise regularly and maintain ideal body weight .I encouraged her to keep her appointment for testing for sleep apnea later this week.  Followup in the future with my nurse practitioner in 3 months or call earlier if necessary. ? ?Stroke Prevention ?Some medical conditions and behaviors can lead to a higher chance of having a stroke. You can help prevent a stroke by eating healthy, exercising, not smoking, and managing any medical conditions you have. ?Stroke is a leading cause of functional impairment. Primary prevention is particularly important because a majority of strokes are first-time events. Stroke changes the lives of not only those who experience a stroke but also their family and other caregivers. ?How can this condition affect me? ?A stroke is a medical emergency and should be treated right away. A stroke can lead to brain damage and can sometimes be life-threatening. If a person gets medical treatment right away, there is a better chance of surviving and recovering from a stroke. ?What can increase my risk? ?The following medical conditions may increase your risk of a stroke: ?Cardiovascular disease. ?High blood pressure  (hypertension). ?Diabetes. ?High cholesterol. ?Sickle cell disease. ?Blood clotting disorders (hypercoagulable state). ?Obesity. ?Sleep disorders (obstructive sleep apnea). ?Other risk factors include: ?Being older than age 79. ?Having a history of blood clots, stroke, or mini-stroke (transient ischemic attack, TIA). ?Genetic factors, such as race, ethnicity, or a family history of stroke. ?Smoking cigarettes or using other tobacco products. ?Taking birth control pills, especially if you also use tobacco. ?Heavy use of alcohol or drugs, especially cocaine and methamphetamine. ?Physical inactivity. ?What actions can I take to prevent this? ?Manage your health conditions ?High cholesterol levels. ?Eating a healthy diet is important for preventing high cholesterol. If cholesterol cannot be managed through diet alone, you may need to take medicines. ?Take any prescribed medicines to control your cholesterol as told by your health care provider. ?Hypertension. ?To reduce your risk of stroke, try to keep your blood pressure below 130/80. ?Eating a healthy diet and exercising regularly are important for controlling blood pressure. If these steps are not enough to manage your blood pressure, you may need to take medicines. ?Take any prescribed medicines to control hypertension as told by your health care provider. ?Ask your health care provider if you should monitor your blood pressure at home. ?Have your blood pressure checked every year, even if your blood pressure is normal. Blood pressure increases with age and some medical conditions. ?Diabetes. ?Eating a healthy diet and exercising regularly are important parts of managing your blood sugar (glucose). If your blood sugar cannot be managed through diet and exercise, you may need to take medicines. ?Take any prescribed medicines to control your diabetes as told by your health care provider. ?Get evaluated for obstructive sleep apnea. Talk to your health care provider  about getting a sleep evaluation if you snore a lot or have excessive sleepiness. ?Make sure that any other medical conditions you have, such as atrial fibrillation or atherosclerosis, are managed. ?Nutrition ?Follow instructions from your health care provider about what to eat or drink to help manage your health condition. These instructions may include: ?Reducing your daily calorie intake. ?Limiting how much salt (sodium) you use to 1,500 milligrams (mg) each day. ?Using only healthy fats for cooking, such as olive oil, canola oil, or sunflower oil. ?Eating healthy foods. You can do this by: ?Choosing foods that are high in fiber, such as whole grains, and fresh fruits and vegetables. ?Eating at least 5 servings of fruits and vegetables a day. Try to fill one-half of your plate with fruits and vegetables at each meal. ?Choosing lean protein foods, such as lean cuts of meat, poultry without skin, fish, tofu, beans, and nuts. ?Eating low-fat dairy products. ?Avoiding foods that are high in sodium. This can help lower blood pressure. ?Avoiding foods that have saturated fat, trans fat, and cholesterol. This can help prevent high cholesterol. ?Avoiding processed and prepared foods. ?Counting your daily carbohydrate intake. ? ?Lifestyle ?If you drink alcohol: ?Limit how much you have to: ?0-1 drink a day for women who are not pregnant. ?0-2 drinks a day for men. ?Know how much alcohol is in your drink. In the [place] one drink equals one 12 oz bottle of beer ( ), one 5 oz glass of wine ( ), or one 1? oz glass of hard liquor (66mL). ?Do not use any products that contain nicotine or tobacco. These products include cigarettes, chewing tobacco, and vaping devices, such as e-cigarettes. If you need help quitting, ask your health care provider. ?Avoid secondhand smoke. ?Do not use drugs. ?Activity ? ?Try to stay at a healthy weight. ?Get at least 30 minutes of exercise on most days, such as: ?Fast  walking. ?Biking. ?Swimming. ?Medicines ?Take over-the-counter and prescription medicines only as told by your health care provider. Aspirin or blood thinners (antiplatelets or anticoagulants) may be recommended to reduce your risk of forming blood clots that can lead to stroke. ?Avoid taking birth control pills. Talk to your health care provider about the risks of taking birth control pills if: ?You are over 37 years old. ?You smoke. ?You get very bad headaches. ?You have had a blood clot. ?Where to find more information ?American Stroke Association: www.strokeassociation.org ?Get help right away if: ?You or a loved one has any symptoms of a stroke. "BE FAST" is an easy way to remember the main warning signs of a stroke: ?B - Balance. Signs are dizziness, sudden trouble walking, or loss of balance. ?E - Eyes. Signs are trouble seeing or a sudden change in vision. ?F - Face. Signs are sudden weakness or numbness of the face, or the face or eyelid drooping on one side. ?A - Arms. Signs are weakness or numbness in an arm. This happens suddenly and usually on one side of the body. ?S - Speech. Signs are sudden trouble speaking, slurred speech, or trouble understanding what people say. ?T - Time. Time to call emergency services. Write down what time symptoms started. ?You or a loved one has other signs of a stroke, such as: ?A sudden, severe headache with no known cause. ?Nausea or vomiting. ?Seizure. ?These symptoms may represent a serious problem that is an emergency. Do not wait to see if the symptoms will go away. Get medical help right away. Call your local emergency services (  911 in the [place] Do not drive yourself to the hospital. ?Summary ?You can help to prevent a stroke by eating healthy, exercising, not smoking, limiting alcohol intake, and managing any medical conditions you may have. ?Do not use any products that contain nicotine or tobacco. These include cigarettes, chewing tobacco, and vaping devices,  such as e-cigarettes. If you need help quitting, ask your health care provider. ?Remember "BE FAST" for warning signs of a stroke. Get help right away if you or a loved one has any of these signs. ?This information is not intended to replace ad

## 2021-04-24 ENCOUNTER — Ambulatory Visit (INDEPENDENT_AMBULATORY_CARE_PROVIDER_SITE_OTHER): Payer: BC Managed Care – PPO | Admitting: Neurology

## 2021-04-24 ENCOUNTER — Encounter: Payer: Self-pay | Admitting: Neurology

## 2021-04-24 VITALS — BP 131/90 | HR 74 | Ht 67.0 in | Wt 236.0 lb

## 2021-04-24 DIAGNOSIS — Z8673 Personal history of transient ischemic attack (TIA), and cerebral infarction without residual deficits: Secondary | ICD-10-CM | POA: Diagnosis not present

## 2021-04-24 DIAGNOSIS — I6521 Occlusion and stenosis of right carotid artery: Secondary | ICD-10-CM

## 2021-04-24 DIAGNOSIS — I639 Cerebral infarction, unspecified: Secondary | ICD-10-CM

## 2021-04-24 DIAGNOSIS — G4719 Other hypersomnia: Secondary | ICD-10-CM | POA: Diagnosis not present

## 2021-04-24 DIAGNOSIS — E669 Obesity, unspecified: Secondary | ICD-10-CM

## 2021-04-24 NOTE — Patient Instructions (Signed)

## 2021-04-24 NOTE — Progress Notes (Signed)
Subjective:  ?  ?Patient ID: Christina [name] is a 41 y.o. female. ? ?HPI ? ? ? ?Huston Foley, MD, PhD ?[facility] ?[address], [address] ?[phone] ?[place], [place] [address] ? ?Dear [name],  ? ?I saw your patient, Christina [name], upon review of information from sensation of her sleep disorder, in particular, concern for underlying obstructive sleep apnea.  The patient is unaccompanied today.  As you know, 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  I reviewed your office note from 08/14/2020 as well as 04/23/2021.  Her Epworth sleepiness score is 14 out of 24, fatigue severity score is 18 out of 63. ?She has been working on weight loss since her stroke and has lost quite a bit of weight.  She has quit smoking since her stroke in April 2022.  She is currently not working.  She goes to bed around midnight and rise time is around 6 AM.  Lives with her husband and 2 younger sons, her older son in college.  She does not drink caffeine daily, drinks alcohol rarely, maybe once or twice a month at the most.  She is not aware of any family history of sleep apnea, denies recurrent morning headaches or night to night nocturia. ? ?Her Past Medical History Is Significant For: ?Past Medical History:  ?Diagnosis Date  ? Asthma   ? Diabetes mellitus without complication (HCC)   ? Hypertension   ? Stroke [facility])   ? ? ?Her Past Surgical History Is Significant For: ?Past Surgical History:  ?Procedure Laterality Date  ? BUBBLE STUDY  05/29/2020  ? Procedure: BUBBLE STUDY;  Surgeon: Little Ishikawa, MD;  Location: [facility] ENDOSCOPY;  Service: Cardiovascular;;  ? IR CT HEAD LTD  05/27/2020  ? IR PERCUTANEOUS ART  THROMBECTOMY/INFUSION INTRACRANIAL INC DIAG ANGIO  05/27/2020  ? RADIOLOGY WITH ANESTHESIA N/A 05/27/2020  ? Procedure: IR WITH ANESTHESIA;  Surgeon: Radiologist, Medication, MD;  Location: [facility] OR;  Service: Radiology;  Laterality: N/A;  ? TEE WITHOUT CARDIOVERSION N/A 05/29/2020  ? Procedure: TRANSESOPHAGEAL ECHOCARDIOGRAM (TEE);  Surgeon: Little Ishikawa, MD;  Location: [facility] ENDOSCOPY;  Service: Cardiovascular;  Laterality: N/A;  ? TUBAL LIGATION    ? ? ?Her Family History Is Significant For: ?History reviewed. No pertinent family history. ? ?Her Social History Is Significant For: ?Social History  ? ?Socioeconomic History  ? Marital status: Married  ?  Spouse name: Ternell  ? Number of children: Not on file  ? Years of education: Not on file  ? Highest education level: Not on file  ?Occupational History  ? Not on file  ?Tobacco Use  ? Smoking status: Every Day  ?  Packs/day: 0.25  ?  Types: Cigarettes  ? Smokeless tobacco: Never  ?Vaping Use  ? Vaping Use: Never used  ?Substance and Sexual Activity  ? Alcohol use: Yes  ?  Comment: occ  ? Drug use: No  ? Sexual activity: Not on file  ?Other Topics Concern  ? Not on file  ?Social History Narrative  ? Lives with husband and 3 children  ? Right Handed  ? Drinks caffeine occasionally  ? ?Social Determinants of Health  ? ?Financial Resource Strain: Not on file  ?Food Insecurity: Not on file  ?Transportation Needs: Not  on file  ?Physical Activity: Not on file  ?Stress: Not on file  ?Social Connections: Not on file  ? ? ?Her Allergies Are:  ?Allergies  ?Allergen Reactions  ? Peanut-Containing Drug Products Anaphylaxis  ? Lisinopril Other (See Comments)  ?  Lip swelling  ? Penicillins Rash  ? Tomato Rash  ?  Fresh tomatoes   ?:  ? ?Her Current Medications Are:  ?Outpatient Encounter Medications as of 04/24/2021  ?Medication Sig  ? albuterol (PROVENTIL HFA;VENTOLIN HFA) 108 (90 BASE) MCG/ACT inhaler Inhale 2 puffs into the lungs every 6 (six) hours as needed for wheezing.   ? amLODipine (NORVASC) 5 MG tablet Take 1 tablet by mouth daily.  ? clopidogrel (PLAVIX) 75 MG tablet Take 1 tablet (75 mg total) by mouth daily.  ? ergocalciferol (VITAMIN D2) 1.25 MG (50000 UT) capsule Take 50,000 Units by mouth every Monday.  ? loratadine (CLARITIN) 10 MG tablet Take 10 mg by mouth daily.  ? metFORMIN (GLUCOPHAGE-XR) 500 MG 24 hr tablet Take 500 mg by mouth daily with breakfast.  ? metoprolol tartrate (LOPRESSOR) 100 MG tablet Take 100 mg by mouth daily.  ? ondansetron (ZOFRAN ODT) 4 MG disintegrating tablet Take 1 tablet (4 mg total) by mouth every 8 (eight) hours as needed for nausea or vomiting.  ? OZEMPIC, 1 MG/DOSE, 4 MG/3ML SOPN Inject 0.75 mLs into the skin once a week.  ? predniSONE (DELTASONE) 50 MG tablet Take 1 tablet (50 mg total) by mouth daily with breakfast.  ? rosuvastatin (CRESTOR) 20 MG tablet Take 1 tablet (20 mg total) by mouth daily.  ? ?No facility-administered encounter medications on file as of 04/24/2021.  ?: ? ? ?Review of Systems:  ?Out of a complete 14 point review of systems, all are reviewed and negative with the exception of these symptoms as listed below: ? ? ?Review of Systems  ?Neurological:   ?     Internal referral from Dr. Pearlean BrownieSethi for OSA. (Seen yesterday).  Snoring.  Hardtime going to sleep.  ESS. 12, FSS 18.   ? ?Objective:  ?Neurological Exam ? ?Physical Exam ?Physical Examination:  ? ?Vitals:  ? 04/24/21 0915  ?BP: 131/90  ?Pulse: 74  ? ? ?General Examination: The patient is a very pleasant 41 y.o. female in no acute distress. She appears well-developed and well-nourished and well groomed.  ? ?HEENT: Normocephalic, atraumatic, pupils are equal, round and reactive to light, extraocular tracking is good without limitation to gaze excursion or nystagmus noted. Hearing is grossly intact. Face is symmetric with normal facial animation. Speech is clear with at times questionable dysarthria noted.  There is no hypophonia. There is no lip, neck/head, jaw or voice  tremor. Neck is supple with full range of passive and active motion. There are no carotid bruits on auscultation. Oropharynx exam reveals: mild mouth dryness, adequate dental hygiene and mild airway crowding, due to elongated uvula which ends up in a with, tonsillar size of about 1+, wider tongue, Mallampati class II.  ? ?Chest: Clear to auscultation without wheezing, rhonchi or crackles noted.  Heart monitor in place. ? ?Heart: S1+S2+0, regular and normal without murmurs, rubs or gallops noted.  ? ?Abdomen: Soft, non-tender and non-distended. ? ?Extremities: There is no pitting edema in the distal lower extremities bilaterally.  ? ?Skin: Warm and dry without trophic changes noted.  ? ?Musculoskeletal: exam reveals no obvious joint deformities, tenderness or joint swelling or erythema.  ? ?Neurologically:  ?Mental status: The patient is awake, alert and oriented in  all 4 spheres. Her immediate and remote memory, attention, language skills and fund of knowledge are appropriate. There is no evidence of aphasia, agnosia, apraxia or anomia. Speech is clear with normal prosody and enunciation. Thought process is linear. Mood is normal and affect is normal.  ?Cranial nerves II - XII are as described above under HEENT exam.  ?Motor exam: Normal bulk, strength and tone is noted. There is no tremor. Fine motor skills and coordination: grossly intact.  ?Cerebellar testing: No dysmetria or intention tremor. There is no truncal or gait ataxia.  ?Sensory exam: intact to light touch in the upper and lower extremities.  ?Gait, station and balance: She stands easily. No veering to one side is noted. No leaning to one side is noted. Posture is age-appropriate and stance is narrow based. Gait shows normal stride length and normal pace. No problems turning are noted.  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

## 2021-05-27 ENCOUNTER — Ambulatory Visit (INDEPENDENT_AMBULATORY_CARE_PROVIDER_SITE_OTHER): Payer: BC Managed Care – PPO | Admitting: Neurology

## 2021-05-27 DIAGNOSIS — G4734 Idiopathic sleep related nonobstructive alveolar hypoventilation: Secondary | ICD-10-CM

## 2021-05-27 DIAGNOSIS — Z8673 Personal history of transient ischemic attack (TIA), and cerebral infarction without residual deficits: Secondary | ICD-10-CM

## 2021-05-27 DIAGNOSIS — G4719 Other hypersomnia: Secondary | ICD-10-CM

## 2021-05-27 DIAGNOSIS — G4733 Obstructive sleep apnea (adult) (pediatric): Secondary | ICD-10-CM | POA: Diagnosis not present

## 2021-05-27 DIAGNOSIS — I639 Cerebral infarction, unspecified: Secondary | ICD-10-CM

## 2021-05-27 DIAGNOSIS — E669 Obesity, unspecified: Secondary | ICD-10-CM

## 2021-05-27 DIAGNOSIS — I6521 Occlusion and stenosis of right carotid artery: Secondary | ICD-10-CM

## 2021-05-27 DIAGNOSIS — G4720 Circadian rhythm sleep disorder, unspecified type: Secondary | ICD-10-CM

## 2021-06-06 NOTE — Procedures (Signed)
PATIENT'S NAME:  Christina [name], Christina [name] ?DOB:      01/02/1981      ?MR#:    [id number]     ?DATE OF RECORDING: 05/27/2021 ?REFERRING M.D.:  Dr. 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 The patient endorsed the Epworth Sleepiness Scale at 12 points. The patient's weight 236 pounds with a height of 67 (inches), resulting in a BMI of 37. kg/m2. The patient's neck circumference measured  inches. ? ?CURRENT MEDICATIONS: Proventil HFA, Norvasc, Plavix, Vitamin D2, Claritin, Glucophage XR, Lopressor, Zofran ODT, Ozempic, Deltasone, Crestor ?  ?PROCEDURE:  This is a multichannel digital polysomnogram utilizing the Somnostar 11.2 system.  Electrodes and sensors were applied and monitored per AASM Specifications.   EEG, EOG, Chin and Limb EMG, were sampled at 200 Hz.  ECG, Snore and Nasal Pressure, Thermal Airflow, Respiratory Effort, CPAP Flow and Pressure, Oximetry was sampled at 50 Hz. Digital video and audio were recorded.     ? ?BASELINE STUDY ? ?Lights Out was at 21:28 and Lights On at 04:59.  Total recording time (TRT) was 452 minutes, with a total sleep time (TST) of 333 minutes.   The patient's sleep latency was 95 minutes, which is delayed. REM latency was 62 minutes, which is mildly reduced. The sleep efficiency was 73.7 %.  ?   ?SLEEP ARCHITECTURE: WASO (Wake after sleep onset) was 28.5 minutes with mild sleep fragmentation noted.  There were 6.5 minutes in Stage N1, 221.5 minutes Stage N2, 34.5 minutes Stage N3 and 70.5 minutes in Stage REM.  The percentage of Stage N1 was 2.%, Stage N2 was 66.5%, which is increased, Stage N3 was 10.4%, which is reduced, and Stage R (REM sleep) was 21.2%, which  is normal. The arousals were noted as: 36 were spontaneous, 0 were associated with PLMs, 10 were associated with respiratory events. ? ?RESPIRATORY ANALYSIS:  There were a total of 31 respiratory events:  0 obstructive apneas, 0 central apneas and 0 mixed apneas with a total of 0 apneas and an apnea index (AI) of 0 /hour. There were 31 hypopneas with a hypopnea index of 5.6 /hour. The patient also had 0 respiratory event related arousals (RERAs).  ?    ?The total APNEA/HYPOPNEA INDEX (AHI) was 5.6/hour and the total RESPIRATORY DISTURBANCE INDEX was  5.6 /hour.  21 events occurred in REM sleep and 20 events in NREM. The REM AHI was  17.9 /hour, versus a non-REM AHI of 2.3. The patient spent 0 minutes of total sleep time in the supine position and 333 minutes in non-supine.. The supine AHI was n/a versus a non-supine AHI of 5.6. ? ?OXYGEN SATURATION & C02:  The Wake baseline 02 saturation was 93%, with the lowest being 82%. Time spent below 89% saturation equaled 114 minutes. During sleep, her average oxygen saturation was around 88%. ? ?PERIODIC LIMB MOVEMENTS: The patient had a total of 0 Periodic Limb Movements.  The Periodic Limb Movement (PLM) index was 0 and the PLM Arousal index was 0/hour. ? ?Audio and video analysis did not show any abnormal or unusual movements, behaviors, phonations or vocalizations. The patient took no bathroom breaks. Mild to moderate snoring was noted. The EKG was in keeping with normal sinus rhythm (NSR). ?Post-study, the patient  indicated that sleep was the same as usual.  ? ?IMPRESSION: ?Obstructive Sleep Apnea (OSA) ?Dysfunctions associated with sleep stages or arousal from sleep ?Nocturnal hypoxemia ? ?RECOMMENDATIONS: ?This study demonstrates overall mild or borderline obstructive sleep apnea, moderate in REM sleep with a total AHI of 5.6/hour, REM AHI of 17.9/hour, and O2 nadir of 82%. The absence of supine sleep may underestimate her sleep disordered breathing. The patient had  lower average oxygen saturations while asleep. Given the patient's medical history and sleep related complaints, treatment with positive airway pressure is recommended; this can be achieved in the form of autoPAP. Alternatively, a full-night CPAP titration study would allow optimization of therapy if needed, down the road. Treatment of her underlying asthma should be optimized if needed. Concomitant weight loss is highly recommended. Other treatment options may include the use of an oral appliance.    ?Please note that untreated obstructive sleep apnea may carry additional perioperative morbidity. Patients with significant obstructive sleep apnea should receive perioperative PAP therapy and the surgeons and particularly the anesthesiologist should be informed of the diagnosis and the severity of the sleep disordered breathing. ?This study shows sleep fragmentation and abnormal sleep stage percentages; these are nonspecific findings and per se do not signify an intrinsic sleep disorder or a cause for the patient's sleep-related symptoms. Causes include (but are not limited to) the first night effect of the sleep study, circadian rhythm disturbances, medication effect or an underlying mood disorder or medical problem.  ?The patient should be cautioned not to drive, work at heights, or operate dangerous or heavy equipment when tired or sleepy. Review and reiteration of good sleep hygiene measures should be pursued with any patient. ?The patient will be seen in follow-up by Dr. Frances Furbish at [facility] for discussion of the test results and further management strategies. The referring provider will be notified of the test results. ? ?I certify that I have reviewed the entire raw data recording prior to the issuance of this report in accordance with the Standards of Accreditation of the American Academy of Sleep Medicine (AASM) ? ?Huston Foley, MD, PhD ?Diplomat, American Board of Neurology and Sleep Medicine (Neurology and Sleep  Medicine) ?

## 2021-06-06 NOTE — Addendum Note (Signed)
Addended by: Huston Foley on: 06/06/2021 06:12 PM ? ? Modules accepted: Orders ? ?

## 2021-06-10 ENCOUNTER — Telehealth: Payer: Self-pay

## 2021-06-10 NOTE — Telephone Encounter (Signed)
I called patient to discuss. No answer, left a message asking her to call us back. If patient calls back another day please route to POD 4. ?

## 2021-06-10 NOTE — Telephone Encounter (Signed)
I called pt. I advised pt that Dr. Frances Furbish reviewed their sleep study results and found that pt has mild osa. Dr. Frances Furbish recommends that pt start an auto-pap at home. I reviewed PAP compliance expectations with the pt. Pt is agreeable to starting an auto-PAP. I advised pt that an order will be sent to a DME, [facility], and [facility] will call the pt within about one week after they file with the pt's insurance. [facility] will show the pt how to use the machine, fit for masks, and troubleshoot the auto-PAP if needed. A follow up appt was made for insurance purposes with Dr. Frances Furbish on 09/12/2021 at 10:45am. Pt verbalized understanding to arrive 15 minutes early and bring their auto-PAP. A letter with all of this information in it will be mailed to the pt as a reminder. I verified with the pt that the address we have on file is correct. She will follow up with her PCP on weight management and asthma. Pt verbalized understanding of results. Pt had no questions at this time but was encouraged to call back if questions arise. I have sent the order to [facility] and have received confirmation that they have received the order. ? ?

## 2021-06-10 NOTE — Telephone Encounter (Signed)
-----   Message from Huston Foley, MD sent at 06/06/2021  6:12 PM EDT ----- ?Patient referred by Dr. Pearlean Brownie, seen by me on 04/24/21, diagnostic PSG on 05/27/21. ? ?? ?Please call and notify the patient that the recent sleep study showed obstructive sleep apnea and she had lower average oxygen saturations during sleep. I recommend she FU with her PCP about asthma management and weight loss. Her OSA is overall mild, but moderate during dream/REM sleep and worth treating to see if she feels better after treatment. To that end I recommend treatment for this in the form of autoPAP, which means, that we don't have to bring her back for a second sleep study with CPAP, but will let her try an autoPAP machine at home, through a DME company (of her choice, or as per insurance requirement). The DME representative will educate her on how to use the machine, how to put the mask on, etc. I have placed an order in the chart. Please send referral, talk to patient, send report to referring MD. We will need a FU in sleep clinic for 10 weeks post-PAP set up, please arrange that with me or one of our NPs. Thanks,  ?? ?Huston Foley, MD, PhD ?[facility]) ?

## 2021-06-22 ENCOUNTER — Other Ambulatory Visit: Payer: Self-pay | Admitting: Internal Medicine

## 2021-07-15 NOTE — Telephone Encounter (Signed)
[employer] Airsense 11 Setup 07/11/21 (appt needed 08/10/21 - 10/11/21)  

## 2021-07-29 ENCOUNTER — Encounter: Payer: Self-pay | Admitting: Adult Health

## 2021-07-29 ENCOUNTER — Ambulatory Visit (INDEPENDENT_AMBULATORY_CARE_PROVIDER_SITE_OTHER): Payer: BC Managed Care – PPO | Admitting: Adult Health

## 2021-07-29 VITALS — BP 134/96 | HR 72 | Ht 67.0 in | Wt 229.0 lb

## 2021-07-29 DIAGNOSIS — I639 Cerebral infarction, unspecified: Secondary | ICD-10-CM | POA: Diagnosis not present

## 2021-09-05 ENCOUNTER — Other Ambulatory Visit: Payer: Self-pay | Admitting: Neurology

## 2021-09-12 ENCOUNTER — Telehealth: Payer: Self-pay | Admitting: *Deleted

## 2021-09-12 ENCOUNTER — Ambulatory Visit (INDEPENDENT_AMBULATORY_CARE_PROVIDER_SITE_OTHER): Payer: BC Managed Care – PPO | Admitting: Neurology

## 2021-09-12 ENCOUNTER — Encounter: Payer: Self-pay | Admitting: Neurology

## 2021-09-12 VITALS — BP 153/109 | HR 74 | Ht 67.0 in | Wt 226.0 lb

## 2021-09-12 DIAGNOSIS — G4734 Idiopathic sleep related nonobstructive alveolar hypoventilation: Secondary | ICD-10-CM | POA: Diagnosis not present

## 2021-09-12 DIAGNOSIS — Z9989 Dependence on other enabling machines and devices: Secondary | ICD-10-CM

## 2021-09-12 DIAGNOSIS — G4733 Obstructive sleep apnea (adult) (pediatric): Secondary | ICD-10-CM

## 2021-09-12 NOTE — Progress Notes (Signed)
Subjective:    Patient ID: Christina [name] is a 41 y.o. female.  HPI    Star Age, MD, PhD [facility] [address], [address] 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  The patient is unaccompanied today.  I first met her at the request of Dr. Leonie Man on 04/24/2021, at which time she reported snoring and excessive daytime somnolence.  Her Epworth sleepiness score was 14 at that time.  She was advised to proceed with a sleep study.  She had a baseline sleep study on 06/06/2021 which showed a sleep efficiency of 73.7%, sleep latency delayed at 95 minutes, REM latency 63 minutes.  She achieved 21.2% of REM sleep.  Total AHI was in the borderline range at 5.6/h, REM AHI was 17.9/h, supine sleep was not achieved.  Average oxygen saturation was 93%, nadir was 82%.  Her average oxygen saturation during sleep was close to 88% most of the time.  She was advised to pursue weight loss and optimization of her asthma as well as treatment with AutoPap therapy.  Her set up date was 07/11/2021.  She has a ResMed AirSense 11 AutoSet machine.  Today, 09/12/2021: I reviewed her AutoPap compliance data from 08/07/2021 through 09/05/2021, which is a total of 30 days, during which time she used her machine 23 days with percent use days greater than 4 hours at 70%, indicating adequate compliance with an average usage of 6 hours and 42 minutes, residual AHI at goal at 0.6/h, average pressure for the 95th percentile at 9.8 cm with a range of 6 to 12 cm with EPR.  Leak acceptable with the 95th percentile at 12.8 L/min.  She  reports feeling improved with her autoPAP, she feels better rested, has more daytime energy.  She feels that it is certainly helpful.  She has had a change in her blood pressure medications, her Norvasc was increased to 10 mg about a month ago and she recently increased her metoprolol to 100 mg twice daily.  She will eventually go on the extended release 200 mg once daily.  She reports having adjusted well to her AutoPap, she is motivated to continue with treatment.  She has had some skipped nights because she ran out of distilled water she reports.  She reports that when she had her sleep study she was going through an asthma exacerbation and needed to be treated with steroids.  When her allergies flareup she does have an increased need for her inhaler.  Her Epworth sleepiness score is 2 out of 24 today.  She uses nasal pillows.  She feels that the seal is okay generally speaking.  Previously:   04/24/21: (She) reports snoring and excessive daytime somnolence.  I reviewed your office note from 08/14/2020 as well as 04/23/2021.  Her Epworth sleepiness score is 14 out of 24, fatigue severity score is 18 out of 63. She has been working on weight loss since her stroke and has lost quite a bit of weight.  She has quit smoking since her stroke in April 2022.  She is currently not working.  She goes to bed around midnight and rise time is around 6 AM.  Lives with her husband and 2 younger sons, her older son in college.  She does not drink caffeine daily, drinks alcohol rarely, maybe once or twice a month at the most.  She is not aware of any family history of sleep apnea, denies recurrent morning headaches or night to night nocturia.  Her Past Medical History Is Significant For: Past Medical History:  Diagnosis Date   Asthma    Diabetes mellitus without complication ([place])    Hypertension    Stroke [facility])     Her Past Surgical History Is Significant For: Past Surgical History:  Procedure Laterality Date    BUBBLE STUDY  05/29/2020   Procedure: BUBBLE STUDY;  Surgeon: Donato Heinz, MD;  Location: [facility] ENDOSCOPY;  Service: Cardiovascular;;   IR CT HEAD LTD  05/27/2020   IR PERCUTANEOUS ART THROMBECTOMY/INFUSION INTRACRANIAL INC DIAG ANGIO  05/27/2020   RADIOLOGY WITH ANESTHESIA N/A 05/27/2020   Procedure: IR WITH ANESTHESIA;  Surgeon: Radiologist, Medication, MD;  Location: [facility];  Service: Radiology;  Laterality: N/A;   TEE WITHOUT CARDIOVERSION N/A 05/29/2020   Procedure: TRANSESOPHAGEAL ECHOCARDIOGRAM (TEE);  Surgeon: Donato Heinz, MD;  Location: [facility] ENDOSCOPY;  Service: Cardiovascular;  Laterality: N/A;   TUBAL LIGATION      Her Family History Is Significant For: History reviewed. No pertinent family history.  Her Social History Is Significant For: Social History   Socioeconomic History   Marital status: Married    Spouse name: Ternell   Number of children: Not on file   Years of education: Not on file   Highest education level: Not on file  Occupational History   Not on file  Tobacco Use   Smoking status: Every Day    Packs/day: 0.25    Types: Cigarettes   Smokeless tobacco: Never  Vaping Use   Vaping Use: Never used  Substance and Sexual Activity   Alcohol use: Yes    Comment: occ   Drug use: No   Sexual activity: Not on file  Other Topics Concern   Not on file  Social History Narrative   Lives with husband and 3 children   Right Handed   Drinks caffeine occasionally   Social Determinants of Health   Financial Resource Strain: Not on file  Food Insecurity: Not on file  Transportation Needs: Not on file  Physical Activity: Not on file  Stress: Not on file  Social Connections: Not on file    Her Allergies Are:  Allergies  Allergen Reactions   Peanut-Containing Drug Products Anaphylaxis   Lisinopril Other (See Comments)    Lip swelling   Penicillins Rash   Tomato Rash    Fresh tomatoes   :   Her Current Medications Are:  Outpatient  Encounter Medications as of 09/12/2021  Medication Sig   albuterol (PROVENTIL HFA;VENTOLIN HFA) 108 (90 BASE) MCG/ACT inhaler Inhale 2 puffs into the lungs every 6 (six) hours as needed for wheezing.   amLODipine (NORVASC) 10 MG tablet Take 10 mg by mouth daily.   clopidogrel (PLAVIX) 75 MG tablet Take 1 tablet (75 mg total) by mouth daily.   ergocalciferol (VITAMIN D2) 1.25 MG (50000 UT) capsule Take 50,000 Units by mouth every Monday.   loratadine (CLARITIN) 10 MG tablet Take 10 mg by mouth daily.   metFORMIN (GLUCOPHAGE-XR) 500 MG 24 hr tablet Take 500 mg by mouth daily with breakfast.   metoprolol tartrate (LOPRESSOR) 100 MG tablet Take 100 mg by mouth daily.   ondansetron (ZOFRAN ODT) 4 MG  disintegrating tablet Take 1 tablet (4 mg total) by mouth every 8 (eight) hours as needed for nausea or vomiting.   OZEMPIC, 2 MG/DOSE, 8 MG/3ML SOPN SMARTSIG:0.75 Milliliter(s) SUB-Q Once a Week   prednisoLONE acetate (PRED FORTE) 1 % ophthalmic suspension Place 1 drop into both eyes as needed.   predniSONE (DELTASONE) 50 MG tablet Take 1 tablet (50 mg total) by mouth daily with breakfast.   rosuvastatin (CRESTOR) 20 MG tablet Take 1 tablet (20 mg total) by mouth daily.   [DISCONTINUED] amLODipine (NORVASC) 5 MG tablet Take 1 tablet by mouth daily. (Patient not taking: Reported on 09/12/2021)   [DISCONTINUED] OZEMPIC, 1 MG/DOSE, 4 MG/3ML SOPN Inject 0.75 mLs into the skin once a week. (Patient not taking: Reported on 09/12/2021)   No facility-administered encounter medications on file as of 09/12/2021.  :   Review of Systems:  Out of a complete 14 point review of systems, all are reviewed and negative with the exception of these symptoms as listed below:   Review of Systems  Neurological:        Initial cpap follow up. ESS 2, FSS 18.  Doing ok, is having good benefit restored/refreshed.  Bp elevated. Her pcp increased her metoprolol to 152m po bid. Amlodipine to 146m      Objective:  Neurological  Exam  Physical Exam Physical Examination:   Vitals:   09/12/21 1049  BP: (!) 153/109  Pulse: 74    General Examination: The patient is a very pleasant [age].o. female in no acute distress. She appears well-developed and well-nourished and well groomed.   HEENT: Normocephalic, atraumatic, pupils are equal, round and reactive to light, extraocular tracking is good without limitation to gaze excursion or nystagmus noted. Hearing is grossly intact. Face is symmetric with normal facial animation. Speech is clear with at times questionable dysarthria noted.  There is no hypophonia. There is no lip, neck/head, jaw or voice tremor. Neck with FROM. There are no carotid bruits on auscultation. Oropharynx exam reveals: mild mouth dryness, adequate dental hygiene and mild airway crowding.  Tongue protrudes centrally and palate elevates symmetrically.   Chest: Clear to auscultation without wheezing, rhonchi or crackles noted.  Heart monitor in place.   Heart: S1+S2+0, regular and normal without murmurs, rubs or gallops noted.    Abdomen: Soft, non-tender and non-distended.   Extremities: There is no pitting edema in the distal lower extremities bilaterally.    Skin: Warm and dry without trophic changes noted.    Musculoskeletal: exam reveals no obvious joint deformities.    Neurologically:  Mental status: The patient is awake, alert and oriented in all 4 spheres. Her immediate and remote memory, attention, language skills and fund of knowledge are appropriate. There is no evidence of aphasia, agnosia, apraxia or anomia. Speech is clear with normal prosody and enunciation. Thought process is linear. Mood is normal and affect is normal.  Cranial nerves II - XII are as described above under HEENT exam.  Motor exam: Normal bulk, strength and tone is noted. There is no obvious tremor. Fine motor skills and coordination: grossly intact.  Cerebellar testing: No dysmetria or intention tremor. There is no  truncal or gait ataxia.  Sensory exam: intact to light touch in the upper and lower extremities.  Gait, station and balance: She stands easily. No veering to one side is noted. No leaning to one side is noted. Posture is age-appropriate and stance is narrow based. Gait shows normal stride length and normal pace. No problems  turning are noted.   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  Her baseline sleep study on 06/06/2021 showed an AHI of 5.6/h, O2 nadir 82%.  She did have lower oxygen saturations during sleep.  She reports that she had an asthma exacerbation around that time.  She feels better in that regard, she is compliant with her AutoPap and feels that it has helped with daytime energy and lessened her daytime somnolence.  She is very motivated to continue with treatment, she has also had an increase in her Ozempic dose about a month ago.  She is commended for her treatment adherence.  For reassurance, I would like to proceed with a pulse oximeter test while she is on her AutoPap at home, we will request this through her current DME company.  We will call her with her results.  She is advised to continue to use her AutoPap consistently and follow-up routinely in 1 year, we will keep her posted as to her pulse oximetry test results by phone call or MyChart messaging in the interim.  She is advised to continue follow-up with her primary neurologist, Dr. Leonie Man, and Frann Rider, NP, she can see Janett Billow for sleep apnea in 1 year as well.  I answered all her questions today and she was in agreement.  I spent 30  minutes in total face-to-face time and in reviewing records during pre-charting, more than 50% of which was spent in counseling and coordination of care, reviewing test results, reviewing medications and treatment regimen and/or in discussing or reviewing the diagnosis of OSA, the prognosis and treatment options. Pertinent laboratory and imaging test results that were available during this visit with the patient were reviewed by me and considered in my medical decision making (see chart for details).

## 2021-09-12 NOTE — Telephone Encounter (Signed)
Per NP: ok to proceed with dental treatment from stroke standpoint; no special precautions and no prophylactic antibiotics are needed from a stroke standpoint. If plavix needs to be held, can hold 3-5 days prior with small but acceptable risk of stroke while off therapy. Restart immediately after. Medical clearance faxed to dentist, received confirmation.

## 2021-09-12 NOTE — Telephone Encounter (Signed)
Completed and placed in out box.  Thank you. 

## 2021-09-12 NOTE — Patient Instructions (Addendum)
As discussed, we will do an overnight oxygen level test, called ONO, and your DME company will call and set this up for one night, while you also use your autoPAP as usual. We will call you with the results. This is to make sure that your oxygen levels stay in the 90s, while you are treated with autoPAP for your OSA. Remember, your oxygen levels dropped into the 80s during the sleep test.   Please continue using your autoPAP regularly. While your insurance requires that you use PAP at least 4 hours each night on 70% of the nights, I recommend, that you not skip any nights and use it throughout the night if you can. Getting used to PAP and staying with the treatment long term does take time and patience and discipline. Untreated obstructive sleep apnea when it is moderate to severe can have an adverse impact on cardiovascular health and raise her risk for heart disease, arrhythmias, hypertension, congestive heart failure, stroke and diabetes. Untreated obstructive sleep apnea causes sleep disruption, nonrestorative sleep, and sleep deprivation. This can have an impact on your day to day functioning and cause daytime sleepiness and impairment of cognitive function, memory loss, mood disturbance, and problems focussing. Using PAP regularly can improve these symptoms.   We can see you in 1 year, you can see Shanda Bumps for sleep apnea check up.

## 2021-09-12 NOTE — Telephone Encounter (Signed)
Received medical clearance form from [facility] re: fillings, crowns, use of local anesthesia. Of note, patient is on plavix. Placed on NP's desk for completion.

## 2021-09-19 ENCOUNTER — Emergency Department (HOSPITAL_COMMUNITY): Payer: BC Managed Care – PPO

## 2021-09-19 ENCOUNTER — Emergency Department (HOSPITAL_COMMUNITY)
Admission: EM | Admit: 2021-09-19 | Discharge: 2021-09-19 | Disposition: A | Discharge status: Home / Self Care | Payer: BC Managed Care – PPO | Attending: Emergency Medicine | Admitting: Emergency Medicine

## 2021-09-19 DIAGNOSIS — H9201 Otalgia, right ear: Secondary | ICD-10-CM | POA: Diagnosis present

## 2021-09-19 DIAGNOSIS — Z5321 Procedure and treatment not carried out due to patient leaving prior to being seen by health care provider: Secondary | ICD-10-CM | POA: Insufficient documentation

## 2021-09-19 DIAGNOSIS — R519 Headache, unspecified: Secondary | ICD-10-CM | POA: Diagnosis not present

## 2021-09-19 LAB — URINALYSIS, ROUTINE W REFLEX MICROSCOPIC
Bacteria, UA: NONE SEEN
Bilirubin Urine: NEGATIVE
Glucose, UA: NEGATIVE mg/dL
Ketones, ur: NEGATIVE mg/dL
Nitrite: NEGATIVE
Protein, ur: 30 mg/dL — AB
Specific Gravity, Urine: 1.018 (ref 1.005–1.030)
pH: 6 (ref 5.0–8.0)

## 2021-09-19 LAB — CBC WITH DIFFERENTIAL/PLATELET
Abs Immature Granulocytes: 0 10*3/uL (ref 0.00–0.07)
Basophils Absolute: 0 10*3/uL (ref 0.0–0.1)
Basophils Relative: 1 %
Eosinophils Absolute: 0.2 10*3/uL (ref 0.0–0.5)
Eosinophils Relative: 6 %
HCT: 44.7 % (ref 36.0–46.0)
Hemoglobin: 14.9 g/dL (ref 12.0–15.0)
Immature Granulocytes: 0 %
Lymphocytes Relative: 56 %
Lymphs Abs: 2.4 10*3/uL (ref 0.7–4.0)
MCH: 31.6 pg (ref 26.0–34.0)
MCHC: 33.3 g/dL (ref 30.0–36.0)
MCV: 94.9 fL (ref 80.0–100.0)
Monocytes Absolute: 0.2 10*3/uL (ref 0.1–1.0)
Monocytes Relative: 5 %
Neutro Abs: 1.3 10*3/uL — ABNORMAL LOW (ref 1.7–7.7)
Neutrophils Relative %: 32 %
Platelets: 294 10*3/uL (ref 150–400)
RBC: 4.71 MIL/uL (ref 3.87–5.11)
RDW: 11.9 % (ref 11.5–15.5)
WBC: 4.3 10*3/uL (ref 4.0–10.5)
nRBC: 0 % (ref 0.0–0.2)

## 2021-09-19 LAB — COMPREHENSIVE METABOLIC PANEL
ALT: 20 U/L (ref 0–44)
AST: 17 U/L (ref 15–41)
Albumin: 4.1 g/dL (ref 3.5–5.0)
Alkaline Phosphatase: 40 U/L (ref 38–126)
Anion gap: 8 (ref 5–15)
BUN: 9 mg/dL (ref 6–20)
CO2: 26 mmol/L (ref 22–32)
Calcium: 9.4 mg/dL (ref 8.9–10.3)
Chloride: 105 mmol/L (ref 98–111)
Creatinine, Ser: 0.81 mg/dL (ref 0.44–1.00)
GFR, Estimated: >60 mL/min (ref >60)
Glucose, Bld: 83 mg/dL (ref 70–99)
Potassium: 3.8 mmol/L (ref 3.5–5.1)
Sodium: 139 mmol/L (ref 135–145)
Total Bilirubin: 1.4 mg/dL — ABNORMAL HIGH (ref 0.3–1.2)
Total Protein: 7.8 g/dL (ref 6.5–8.1)

## 2021-09-19 LAB — RAPID URINE DRUG SCREEN, HOSP PERFORMED
Amphetamines: NOT DETECTED
Barbiturates: NOT DETECTED
Benzodiazepines: NOT DETECTED
Cocaine: NOT DETECTED
Opiates: NOT DETECTED
Tetrahydrocannabinol: POSITIVE — AB

## 2021-09-19 LAB — I-STAT BETA HCG BLOOD, ED (MC, WL, AP ONLY): I-stat hCG, quantitative: <5 m[IU]/mL (ref <5)

## 2021-09-19 LAB — APTT: aPTT: 34 seconds (ref 24–36)

## 2021-09-19 LAB — ETHANOL: Alcohol, Ethyl (B): <10 mg/dL (ref <10)

## 2021-09-19 LAB — PROTIME-INR
INR: 1.1 (ref 0.8–1.2)
Prothrombin Time: 14.1 seconds (ref 11.4–15.2)

## 2021-09-19 NOTE — ED Triage Notes (Signed)
Patient with R earache since the 7th of August. Saw PCP for same and with no inflammation. Also complaining of nagging  headache intermittently for a week now. Patient also feels like the earache has spread to the L side as well.  Pt with a hx of stroke in February and had a headache precursory to it and feels concerned in regards to this.

## 2021-09-19 NOTE — ED Provider Triage Note (Signed)
Emergency Medicine Provider Triage Evaluation Note  Christina [name] , a 41 y.o. female  was evaluated in triage.  Pt complains of right earache last week that has now moved to the left. She has also complained of a headahce that has been nagging for the past week. Patient had a stroke in Feb and reports the headache was the precursor and is concerned.   Review of Systems  Positive:  Negative:   Physical Exam  BP (!) 154/110 (BP Location: Right Arm)   Pulse 76   Temp 98.4 F (36.9 C) (Oral)   Resp 16   SpO2 100%  Gen:   Awake, no distress   Resp:  Normal effort  MSK:   Moves extremities without difficulty  Other:  Cranial nerves grossly intact, other than some sensory changes to the right cheek. No pronator drift. Patient answering questions appropriately with appropriate speech.   Medical Decision Making  Medically screening exam initiated at 12:30 PM.  Appropriate orders placed.  Christina [name] was informed that the remainder of the evaluation will be completed by another provider, this initial triage assessment does not replace that evaluation, and the importance of remaining in the ED until their evaluation is complete.  Spoke with my attending. Will proceed with labs and CT imaging.    Christina [name], [place] 09/19/21 1232

## 2021-10-12 ENCOUNTER — Other Ambulatory Visit: Payer: Self-pay | Admitting: Internal Medicine

## 2021-10-20 ENCOUNTER — Other Ambulatory Visit: Payer: Self-pay | Admitting: Internal Medicine

## 2021-10-22 ENCOUNTER — Telehealth: Payer: Self-pay | Admitting: Neurology

## 2021-10-22 NOTE — Telephone Encounter (Signed)
I received patient's pulse oximetry report.  She had an overnight pulse oximetry test while on her AutoPap on 09/26/2021 through 09/27/2021 for a total test time of 9 hours and 10 minutes.  Average oxygen saturation was 92.4%, nadir was 85%.  Time below or at 88% saturation was 3 minutes and 40 seconds.  Please advise patient that her oxygen saturations are adequate while she is on AutoPap therapy.  She had a couple of desaturations into the higher and mid 80s, not enough to justify additional oxygen supplementation.  Please encourage her to continue to be fully compliant with her AutoPap machine, and continue to work on weight loss, try to sleep on her sides is much as possible.  She can follow-up in sleep clinic as scheduled.

## 2021-10-23 NOTE — Telephone Encounter (Signed)
Spoke with patient and reviewed overnight pulse oximetry test results as noted below by Dr Rexene Alberts which includes details about the testing and recommendations. Pt confirmed she was on autopap during the test. Pt verbalized understanding and appreciation of the results. She will follow-up as scheduled. Has two appts with [facility]. One w/ Janett Billow this December and one with Dr Rexene Alberts next August.

## 2021-10-29 ENCOUNTER — Telehealth: Payer: Self-pay | Admitting: Neurology

## 2021-10-29 DIAGNOSIS — Z0271 Encounter for disability determination: Secondary | ICD-10-CM

## 2021-10-29 NOTE — Telephone Encounter (Signed)
Called and LMVM for pt that if from a sleep apnea standpoint Dr. Rexene Alberts would fill out form if for stroke, then would be primary neurologist, Janett Billow NP).  Pt can fax form to [place] at [phone], or call back if questions.

## 2021-10-29 NOTE — Telephone Encounter (Signed)
If she needs clearance from the sleep apnea standpoint, and has a form to be filled out, I can fill it out.   If she needs clearance from the stroke standpoint, this will need to come from her primary neurologist.

## 2021-10-29 NOTE — Telephone Encounter (Signed)
Pt is calling. Stated she want to talk to a nurse about getting clearance for a breast reducing. Pt is requesting a return call.

## 2021-10-30 NOTE — Telephone Encounter (Signed)
Pt called back and stating that the forms were to be given to Dr.Sethi or Maricela Bo. NP and if there is anyway that the forms can be given to them since in same office. Please advise.

## 2021-10-31 NOTE — Telephone Encounter (Signed)
Contacted pt back, she stated she will need it from a stroke standpoint. Advised her we have not received forms yet, advised to fax to medical records [phone]. Pt will contact surgeons office and get it sent.

## 2021-11-04 NOTE — Telephone Encounter (Signed)
Dr. Leonie Man has reviewed this pw and provided medical clearance as requested.   I have faxed back to # [phone], confirmation received.

## 2021-11-07 ENCOUNTER — Encounter: Payer: Self-pay | Admitting: Neurology

## 2021-11-25 ENCOUNTER — Other Ambulatory Visit: Payer: Self-pay | Admitting: Neurology

## 2021-12-02 ENCOUNTER — Other Ambulatory Visit: Payer: Self-pay

## 2021-12-02 ENCOUNTER — Encounter (HOSPITAL_BASED_OUTPATIENT_CLINIC_OR_DEPARTMENT_OTHER): Payer: Self-pay | Admitting: Plastic Surgery

## 2021-12-02 NOTE — Progress Notes (Signed)
Neuro clearance and Plavix instructions on pt's chart in pre-op. Pre op call completed with pt, she states that 10/30 will be her last dose of Plavix. Instructed to take Metoprolol and Norvasc only on day of surgery.

## 2021-12-02 NOTE — Progress Notes (Signed)
   12/02/21 1537  PAT Phone Screen  Do You Have Diabetes? (S)  Yes (last dose of ozempic- Saturday 10/28)  Do You Have Hypertension? Yes  Have You Ever Been to the ER for Asthma? No  Have You Taken Oral Steroids in the Past 3 Months? No  Do you Take Phenteramine or any Other Diet Drugs? No  Recent  Lab Work, EKG, CXR? Yes  Where was this test performed? EKG SR2/17/23  Have You Ever Had Tests on Your Heart? Yes  When was Test Performed? 03/22/21  Where? Echo EF 55-60%  Any Recent Hospitalizations? No  Height 5\' 7"  (1.702 m)  Weight 96.2 kg  [employer] (S)  Yes (BMET)

## 2021-12-04 ENCOUNTER — Encounter (HOSPITAL_BASED_OUTPATIENT_CLINIC_OR_DEPARTMENT_OTHER)
Admission: RE | Admit: 2021-12-04 | Discharge: 2021-12-04 | Disposition: A | Discharge status: Home / Self Care | Payer: BC Managed Care – PPO | Source: Ambulatory Visit | Attending: Plastic Surgery | Admitting: Plastic Surgery

## 2021-12-04 DIAGNOSIS — M542 Cervicalgia: Secondary | ICD-10-CM | POA: Diagnosis not present

## 2021-12-04 DIAGNOSIS — Z01812 Encounter for preprocedural laboratory examination: Secondary | ICD-10-CM | POA: Insufficient documentation

## 2021-12-04 DIAGNOSIS — E119 Type 2 diabetes mellitus without complications: Secondary | ICD-10-CM | POA: Insufficient documentation

## 2021-12-04 DIAGNOSIS — N62 Hypertrophy of breast: Secondary | ICD-10-CM | POA: Diagnosis present

## 2021-12-04 DIAGNOSIS — L304 Erythema intertrigo: Secondary | ICD-10-CM | POA: Diagnosis not present

## 2021-12-04 DIAGNOSIS — Z7902 Long term (current) use of antithrombotics/antiplatelets: Secondary | ICD-10-CM | POA: Diagnosis not present

## 2021-12-04 DIAGNOSIS — G8929 Other chronic pain: Secondary | ICD-10-CM | POA: Diagnosis not present

## 2021-12-04 DIAGNOSIS — Z87891 Personal history of nicotine dependence: Secondary | ICD-10-CM | POA: Diagnosis not present

## 2021-12-04 DIAGNOSIS — N6042 Mammary duct ectasia of left breast: Secondary | ICD-10-CM | POA: Diagnosis not present

## 2021-12-04 DIAGNOSIS — E785 Hyperlipidemia, unspecified: Secondary | ICD-10-CM | POA: Diagnosis not present

## 2021-12-04 DIAGNOSIS — Z794 Long term (current) use of insulin: Secondary | ICD-10-CM | POA: Insufficient documentation

## 2021-12-04 DIAGNOSIS — I10 Essential (primary) hypertension: Secondary | ICD-10-CM | POA: Diagnosis not present

## 2021-12-04 DIAGNOSIS — M549 Dorsalgia, unspecified: Secondary | ICD-10-CM | POA: Diagnosis not present

## 2021-12-04 DIAGNOSIS — J45909 Unspecified asthma, uncomplicated: Secondary | ICD-10-CM | POA: Diagnosis not present

## 2021-12-04 DIAGNOSIS — N6082 Other benign mammary dysplasias of left breast: Secondary | ICD-10-CM | POA: Diagnosis not present

## 2021-12-04 DIAGNOSIS — Z8673 Personal history of transient ischemic attack (TIA), and cerebral infarction without residual deficits: Secondary | ICD-10-CM | POA: Diagnosis not present

## 2021-12-04 DIAGNOSIS — G4733 Obstructive sleep apnea (adult) (pediatric): Secondary | ICD-10-CM | POA: Diagnosis not present

## 2021-12-04 DIAGNOSIS — N6041 Mammary duct ectasia of right breast: Secondary | ICD-10-CM | POA: Diagnosis not present

## 2021-12-04 DIAGNOSIS — G935 Compression of brain: Secondary | ICD-10-CM | POA: Diagnosis not present

## 2021-12-04 LAB — BASIC METABOLIC PANEL
Anion gap: 6 (ref 5–15)
BUN: 5 mg/dL — ABNORMAL LOW (ref 6–20)
CO2: 30 mmol/L (ref 22–32)
Calcium: 9.1 mg/dL (ref 8.9–10.3)
Chloride: 106 mmol/L (ref 98–111)
Creatinine, Ser: 0.93 mg/dL (ref 0.44–1.00)
GFR, Estimated: >60 mL/min (ref >60)
Glucose, Bld: 85 mg/dL (ref 70–99)
Potassium: 4.5 mmol/L (ref 3.5–5.1)
Sodium: 142 mmol/L (ref 135–145)

## 2021-12-04 MED ORDER — CHLORHEXIDINE GLUCONATE CLOTH 2 % EX PADS: 6.0000 | MEDICATED_PAD | Freq: Once | CUTANEOUS | Status: DC

## 2021-12-04 NOTE — Progress Notes (Signed)
Pt given CHG soap and Ensure Presurgery drink. Written instructions provided for both. To drink by 0415 DOS. Pt verbalized understanding     Enhanced Recovery after Surgery for Orthopedics Enhanced Recovery after Surgery is a protocol used to improve the stress on your body and your recovery after surgery.  Patient Instructions  The night before surgery:  No food after midnight. ONLY clear liquids after midnight  The day of surgery (if you do NOT have diabetes):  Drink ONE (1) Pre-Surgery Clear Ensure as directed.   This drink was given to you during your hospital  pre-op appointment visit. The pre-op nurse will instruct you on the time to drink the  Pre-Surgery Ensure depending on your surgery time. Finish the drink at the designated time by the pre-op nurse.  Nothing else to drink after completing the  Pre-Surgery Clear Ensure.  The day of surgery (if you have diabetes): Drink ONE (1) Gatorade 2 (G2) as directed. This drink was given to you during your hospital  pre-op appointment visit.  The pre-op nurse will instruct you on the time to drink the   Gatorade 2 (G2) depending on your surgery time. Color of the Gatorade may vary. Red is not allowed. Nothing else to drink after completing the  Gatorade 2 (G2).         If you have questions, please contact your surgeon's office.

## 2021-12-05 NOTE — H&P (Signed)
Subjective:     Patient ID: Christina [name] is a 41 y.o. female.   HPI   Returns for follow up discussion breast reduction. Prior to weight loss 48 DDD. Reports several year history neck back pain shoulder grooving. Reports rashes recurrent beneath breasts that has continued despite use antiperspirant beneath breast, hygiene measures. Has tried OTC pain medication specialty fitted bras weight loss for over 6 month trial without change. Denies numbness hands.    MMG 9.23.23 benign. Denies FH breast or ovarian ca.    Wt- highest 327 lb over 2 years ago. Was initially considering bariatric surgery, but has lost nearly 100 lb on Ozempic. Goal 200 lb. Wt stable since time of initial consult.   PMH significant for stroke 2022. Treated with TPA angioplasty. 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 Followed by [facility]. Notes diminished sensation tongue as residual. Negative Rheumatology work up. Recently underwent D&C Novasure for menorrhagia on plavix. PMH also includes DM HbA1c 5.1 08/2021, OSA on auto PAP, Chiari malformation, HLD, asthma.   Quit smoking 05/2020.   Not working. Lives with spouse and kids ages 11, 49, 74   Review of Systems  Musculoskeletal: Positive for back pain and neck pain.  Allergic/Immunologic: Positive for food allergies.    Remainder 12 point review negative    Objective:   Physical Exam Cardiovascular:     Rate and Rhythm: Normal rate and regular rhythm.     Heart sounds: Normal heart sounds.  Pulmonary:     Effort: Pulmonary effort is normal.     Breath sounds: Normal breath sounds.  Lymphadenopathy:     Upper Body:     Right upper body: No axillary adenopathy.     Left upper body: No axillary adenopathy.  Skin:    Comments: Fitzpatrick 6     +shoulder grooving Breasts: grade 3 ptosis bilateral No masses SN to nipple R 44 L 43 cm BW R 24 L 24 cm Nipple to IMF R 19 L 20 cm      Assessment:     Macromastia Chronic neck and back  pain Obesity Hx stroke on Plavix    Plan:         Reviewed her anticoagulation will increase her risks bleeding complications from surgery. Neurology recommended holding Plavix 3-5 d prior to surgery, patient has held. Hold Ozempic week prior to surgery-she takes on Saturdays and last dose will be 6 d prior to surgery.   Chronic neck and back pain that has failed conservative measures in setting of macromastia. No other cause of back pain noted. There is a reasonable likelihood that the patient's symptoms are primarily due to macromastia. Breast reduction surgery has reasonable expectation to relieve chronic pain symptoms.    Reviewed reduction with anchor type scars, OP surgery, drains, post operative visits and limitations, recovery. Diminished sensation nipple and breast skin, risk of nipple loss, wound healing problems, asymmetry, incidental carcinoma, changes with wt gain/loss, aging, unacceptable cosmetic appearance reviewed. Reviewed given her SN to nipple distance she is high risk nipple loss. Reviewed free nipple graft as possibility- in this setting NAC would have no sensation not stimulate and color changes, not be able to breast feed. Cannot assure cup size.    Ideally stabilization weight loss prior to surgery. Reviewed significant weight loss post operative will lead to early recurrent ptosis.    Additional risks including but not limited to bleeding infection seroma hematoma need for additional surgery damage to adjacent structures blood clots  in legs or lungs reviewed.   Drain teaching completed. Rx for Norco given.   Anticipate over 500 g reduction from each breast.

## 2021-12-05 NOTE — Anesthesia Preprocedure Evaluation (Addendum)
Anesthesia Evaluation  Patient identified by MRN, date of birth, ID band Patient awake    Reviewed: Allergy & Precautions, NPO status , Patient's Chart, lab work & pertinent test results  Airway Mallampati: I  TM Distance: >3 FB Neck ROM: Full    Dental  (+) Teeth Intact, Dental Advisory Given   Pulmonary asthma , sleep apnea , former smoker   breath sounds clear to auscultation       Cardiovascular hypertension, Pt. on medications and Pt. on home beta blockers  Rhythm:Regular Rate:Normal     Neuro/Psych CVA  negative psych ROS   GI/Hepatic Neg liver ROS,GERD  ,,  Endo/Other  diabetes    Renal/GU      Musculoskeletal negative musculoskeletal ROS (+)    Abdominal   Peds  Hematology negative hematology ROS (+)   Anesthesia Other Findings   Reproductive/Obstetrics negative OB ROS                             Anesthesia Physical Anesthesia Plan  ASA: 3  Anesthesia Plan: General   Post-op Pain Management:    Induction: Intravenous, Rapid sequence and Cricoid pressure planned  PONV Risk Score and Plan: 4 or greater and Ondansetron, Dexamethasone, Midazolam and Scopolamine patch - Pre-op  Airway Management Planned: Oral ETT  Additional Equipment: None  Intra-op Plan:   Post-operative Plan: Extubation in OR  Informed Consent: I have reviewed the patients History and Physical, chart, labs and discussed the procedure including the risks, benefits and alternatives for the proposed anesthesia with the patient or authorized representative who has indicated his/her understanding and acceptance.     Dental advisory given  Plan Discussed with: CRNA  Anesthesia Plan Comments:        Anesthesia Quick Evaluation

## 2021-12-06 ENCOUNTER — Ambulatory Visit (HOSPITAL_BASED_OUTPATIENT_CLINIC_OR_DEPARTMENT_OTHER)
Admission: RE | Admit: 2021-12-06 | Discharge: 2021-12-06 | Disposition: A | Discharge status: Home / Self Care | Payer: BC Managed Care – PPO | Source: Ambulatory Visit | Attending: Plastic Surgery | Admitting: Plastic Surgery

## 2021-12-06 ENCOUNTER — Encounter (HOSPITAL_BASED_OUTPATIENT_CLINIC_OR_DEPARTMENT_OTHER)
Admission: RE | Disposition: A | Discharge status: Home / Self Care | Payer: Self-pay | Source: Ambulatory Visit | Attending: Plastic Surgery

## 2021-12-06 ENCOUNTER — Other Ambulatory Visit: Payer: Self-pay

## 2021-12-06 ENCOUNTER — Ambulatory Visit (HOSPITAL_BASED_OUTPATIENT_CLINIC_OR_DEPARTMENT_OTHER): Payer: BC Managed Care – PPO | Admitting: Anesthesiology

## 2021-12-06 ENCOUNTER — Encounter (HOSPITAL_BASED_OUTPATIENT_CLINIC_OR_DEPARTMENT_OTHER): Payer: Self-pay | Admitting: Plastic Surgery

## 2021-12-06 DIAGNOSIS — L304 Erythema intertrigo: Secondary | ICD-10-CM | POA: Insufficient documentation

## 2021-12-06 DIAGNOSIS — E785 Hyperlipidemia, unspecified: Secondary | ICD-10-CM | POA: Insufficient documentation

## 2021-12-06 DIAGNOSIS — G935 Compression of brain: Secondary | ICD-10-CM | POA: Insufficient documentation

## 2021-12-06 DIAGNOSIS — N6041 Mammary duct ectasia of right breast: Secondary | ICD-10-CM | POA: Insufficient documentation

## 2021-12-06 DIAGNOSIS — Z87891 Personal history of nicotine dependence: Secondary | ICD-10-CM | POA: Insufficient documentation

## 2021-12-06 DIAGNOSIS — G4733 Obstructive sleep apnea (adult) (pediatric): Secondary | ICD-10-CM | POA: Insufficient documentation

## 2021-12-06 DIAGNOSIS — N62 Hypertrophy of breast: Secondary | ICD-10-CM | POA: Diagnosis not present

## 2021-12-06 DIAGNOSIS — Z7902 Long term (current) use of antithrombotics/antiplatelets: Secondary | ICD-10-CM | POA: Insufficient documentation

## 2021-12-06 DIAGNOSIS — I10 Essential (primary) hypertension: Secondary | ICD-10-CM | POA: Insufficient documentation

## 2021-12-06 DIAGNOSIS — J45909 Unspecified asthma, uncomplicated: Secondary | ICD-10-CM | POA: Insufficient documentation

## 2021-12-06 DIAGNOSIS — N6042 Mammary duct ectasia of left breast: Secondary | ICD-10-CM | POA: Insufficient documentation

## 2021-12-06 DIAGNOSIS — M542 Cervicalgia: Secondary | ICD-10-CM | POA: Insufficient documentation

## 2021-12-06 DIAGNOSIS — G8929 Other chronic pain: Secondary | ICD-10-CM | POA: Insufficient documentation

## 2021-12-06 DIAGNOSIS — Z8673 Personal history of transient ischemic attack (TIA), and cerebral infarction without residual deficits: Secondary | ICD-10-CM | POA: Insufficient documentation

## 2021-12-06 DIAGNOSIS — N6082 Other benign mammary dysplasias of left breast: Secondary | ICD-10-CM | POA: Insufficient documentation

## 2021-12-06 DIAGNOSIS — M549 Dorsalgia, unspecified: Secondary | ICD-10-CM | POA: Insufficient documentation

## 2021-12-06 DIAGNOSIS — E119 Type 2 diabetes mellitus without complications: Secondary | ICD-10-CM | POA: Insufficient documentation

## 2021-12-06 HISTORY — DX: Sleep apnea, unspecified: G47.30

## 2021-12-06 HISTORY — DX: Gastro-esophageal reflux disease without esophagitis: K21.9

## 2021-12-06 HISTORY — PX: BREAST REDUCTION SURGERY: SHX8

## 2021-12-06 LAB — GLUCOSE, CAPILLARY
Glucose-Capillary: 92 mg/dL (ref 70–99)
Glucose-Capillary: 111 mg/dL — ABNORMAL HIGH (ref 70–99)

## 2021-12-06 LAB — POCT PREGNANCY, URINE: Preg Test, Ur: NEGATIVE

## 2021-12-06 SURGERY — MAMMOPLASTY, REDUCTION
Anesthesia: General | Site: Breast | Laterality: Bilateral

## 2021-12-06 MED ORDER — GABAPENTIN 300 MG PO CAPS
300.0000 mg | ORAL_CAPSULE | ORAL | Status: AC
  Administered 2021-12-06: 300 mg via ORAL

## 2021-12-06 MED ORDER — AMISULPRIDE (ANTIEMETIC) 5 MG/2ML IV SOLN: 10.0000 mg | Freq: Once | INTRAVENOUS | Status: DC | PRN

## 2021-12-06 MED ORDER — PROPOFOL 10 MG/ML IV BOLUS
INTRAVENOUS | Status: AC
  Filled 2021-12-06: qty 20

## 2021-12-06 MED ORDER — PHENYLEPHRINE HCL (PRESSORS) 10 MG/ML IV SOLN
INTRAVENOUS | Status: DC | PRN
  Administered 2021-12-06: 40 ug via INTRAVENOUS
  Administered 2021-12-06: 80 ug via INTRAVENOUS
  Administered 2021-12-06 (×2): 40 ug via INTRAVENOUS
  Administered 2021-12-06: 80 ug via INTRAVENOUS
  Administered 2021-12-06 (×2): 40 ug via INTRAVENOUS

## 2021-12-06 MED ORDER — OXYCODONE HCL 5 MG PO TABS
5.0000 mg | ORAL_TABLET | Freq: Once | ORAL | Status: AC | PRN
  Administered 2021-12-06: 5 mg via ORAL

## 2021-12-06 MED ORDER — SUGAMMADEX SODIUM 500 MG/5ML IV SOLN
INTRAVENOUS | Status: DC | PRN
  Administered 2021-12-06: 400 mg via INTRAVENOUS

## 2021-12-06 MED ORDER — CELECOXIB 200 MG PO CAPS
ORAL_CAPSULE | ORAL | Status: AC
  Filled 2021-12-06: qty 1

## 2021-12-06 MED ORDER — ONDANSETRON HCL 4 MG/2ML IJ SOLN
INTRAMUSCULAR | Status: DC | PRN
  Administered 2021-12-06: 4 mg via INTRAVENOUS

## 2021-12-06 MED ORDER — FENTANYL CITRATE (PF) 100 MCG/2ML IJ SOLN
INTRAMUSCULAR | Status: AC
  Filled 2021-12-06: qty 2

## 2021-12-06 MED ORDER — SCOPOLAMINE 1 MG/3DAYS TD PT72
MEDICATED_PATCH | TRANSDERMAL | Status: AC
  Filled 2021-12-06: qty 1

## 2021-12-06 MED ORDER — ACETAMINOPHEN 10 MG/ML IV SOLN: 1000.0000 mg | Freq: Once | INTRAVENOUS | Status: DC | PRN

## 2021-12-06 MED ORDER — ALBUTEROL SULFATE HFA 108 (90 BASE) MCG/ACT IN AERS
INHALATION_SPRAY | RESPIRATORY_TRACT | Status: DC | PRN
  Administered 2021-12-06: 2 via RESPIRATORY_TRACT
  Administered 2021-12-06: 3 via RESPIRATORY_TRACT

## 2021-12-06 MED ORDER — DEXAMETHASONE SODIUM PHOSPHATE 10 MG/ML IJ SOLN
INTRAMUSCULAR | Status: AC
  Filled 2021-12-06: qty 1

## 2021-12-06 MED ORDER — ACETAMINOPHEN 500 MG PO TABS
ORAL_TABLET | ORAL | Status: AC
  Filled 2021-12-06: qty 2

## 2021-12-06 MED ORDER — PROPOFOL 10 MG/ML IV BOLUS
INTRAVENOUS | Status: DC | PRN
  Administered 2021-12-06: 150 mg via INTRAVENOUS

## 2021-12-06 MED ORDER — ACETAMINOPHEN 325 MG PO TABS: 325.0000 mg | ORAL_TABLET | ORAL | Status: DC | PRN

## 2021-12-06 MED ORDER — FENTANYL CITRATE (PF) 100 MCG/2ML IJ SOLN
25.0000 ug | INTRAMUSCULAR | Status: DC | PRN
  Administered 2021-12-06: 50 ug via INTRAVENOUS

## 2021-12-06 MED ORDER — SUCCINYLCHOLINE CHLORIDE 200 MG/10ML IV SOSY
PREFILLED_SYRINGE | INTRAVENOUS | Status: DC | PRN
  Administered 2021-12-06: 60 mg via INTRAVENOUS

## 2021-12-06 MED ORDER — ACETAMINOPHEN 160 MG/5ML PO SOLN: 325.0000 mg | ORAL | Status: DC | PRN

## 2021-12-06 MED ORDER — 0.9 % SODIUM CHLORIDE (POUR BTL) OPTIME
TOPICAL | Status: DC | PRN
  Administered 2021-12-06: 1000 mL

## 2021-12-06 MED ORDER — ATROPINE SULFATE 0.4 MG/ML IV SOLN
INTRAVENOUS | Status: AC
  Filled 2021-12-06: qty 1

## 2021-12-06 MED ORDER — BUPIVACAINE HCL (PF) 0.5 % IJ SOLN
INTRAMUSCULAR | Status: DC | PRN
  Administered 2021-12-06: 30 mL

## 2021-12-06 MED ORDER — FENTANYL CITRATE (PF) 100 MCG/2ML IJ SOLN
INTRAMUSCULAR | Status: DC | PRN
  Administered 2021-12-06 (×2): 50 ug via INTRAVENOUS
  Administered 2021-12-06: 25 ug via INTRAVENOUS

## 2021-12-06 MED ORDER — HYDROMORPHONE HCL 1 MG/ML IJ SOLN
INTRAMUSCULAR | Status: AC
  Filled 2021-12-06: qty 1

## 2021-12-06 MED ORDER — GABAPENTIN 300 MG PO CAPS
ORAL_CAPSULE | ORAL | Status: AC
  Filled 2021-12-06: qty 1

## 2021-12-06 MED ORDER — CEFAZOLIN SODIUM-DEXTROSE 2-4 GM/100ML-% IV SOLN
INTRAVENOUS | Status: AC
  Filled 2021-12-06: qty 100

## 2021-12-06 MED ORDER — OXYCODONE HCL 5 MG PO TABS
ORAL_TABLET | ORAL | Status: AC
  Filled 2021-12-06: qty 1

## 2021-12-06 MED ORDER — SCOPOLAMINE 1 MG/3DAYS TD PT72: 1.0000 | MEDICATED_PATCH | TRANSDERMAL | Status: DC

## 2021-12-06 MED ORDER — MIDAZOLAM HCL 2 MG/2ML IJ SOLN
INTRAMUSCULAR | Status: AC
  Filled 2021-12-06: qty 2

## 2021-12-06 MED ORDER — MIDAZOLAM HCL 5 MG/5ML IJ SOLN
INTRAMUSCULAR | Status: DC | PRN
  Administered 2021-12-06: 2 mg via INTRAVENOUS

## 2021-12-06 MED ORDER — CEFAZOLIN SODIUM-DEXTROSE 2-4 GM/100ML-% IV SOLN
2.0000 g | INTRAVENOUS | Status: AC
  Administered 2021-12-06: 2 g via INTRAVENOUS

## 2021-12-06 MED ORDER — OXYCODONE HCL 5 MG/5ML PO SOLN: 5.0000 mg | Freq: Once | ORAL | Status: AC | PRN

## 2021-12-06 MED ORDER — ACETAMINOPHEN 500 MG PO TABS
1000.0000 mg | ORAL_TABLET | ORAL | Status: AC
  Administered 2021-12-06: 1000 mg via ORAL

## 2021-12-06 MED ORDER — ONDANSETRON HCL 4 MG/2ML IJ SOLN
INTRAMUSCULAR | Status: AC
  Filled 2021-12-06: qty 2

## 2021-12-06 MED ORDER — LIDOCAINE HCL (CARDIAC) PF 100 MG/5ML IV SOSY
PREFILLED_SYRINGE | INTRAVENOUS | Status: DC | PRN
  Administered 2021-12-06: 40 mg via INTRAVENOUS

## 2021-12-06 MED ORDER — PROPOFOL 500 MG/50ML IV EMUL
INTRAVENOUS | Status: DC | PRN
  Administered 2021-12-06: 35 ug/kg/min via INTRAVENOUS

## 2021-12-06 MED ORDER — ROCURONIUM BROMIDE 10 MG/ML (PF) SYRINGE
PREFILLED_SYRINGE | INTRAVENOUS | Status: AC
  Filled 2021-12-06: qty 10

## 2021-12-06 MED ORDER — DEXAMETHASONE SODIUM PHOSPHATE 4 MG/ML IJ SOLN
INTRAMUSCULAR | Status: DC | PRN
  Administered 2021-12-06: 5 mg via INTRAVENOUS

## 2021-12-06 MED ORDER — BUPIVACAINE HCL (PF) 0.5 % IJ SOLN
INTRAMUSCULAR | Status: AC
  Filled 2021-12-06: qty 30

## 2021-12-06 MED ORDER — CELECOXIB 200 MG PO CAPS
200.0000 mg | ORAL_CAPSULE | ORAL | Status: AC
  Administered 2021-12-06: 200 mg via ORAL

## 2021-12-06 MED ORDER — LACTATED RINGERS IV SOLN: INTRAVENOUS | Status: DC

## 2021-12-06 MED ORDER — HYDROMORPHONE HCL 1 MG/ML IJ SOLN
INTRAMUSCULAR | Status: DC | PRN
  Administered 2021-12-06 (×2): .25 mg via INTRAVENOUS

## 2021-12-06 MED ORDER — ROCURONIUM BROMIDE 100 MG/10ML IV SOLN
INTRAVENOUS | Status: DC | PRN
  Administered 2021-12-06: 50 mg via INTRAVENOUS

## 2021-12-06 MED ORDER — PROMETHAZINE HCL 25 MG/ML IJ SOLN: 6.2500 mg | INTRAMUSCULAR | Status: DC | PRN

## 2021-12-06 SURGICAL SUPPLY — 83 items
ADH SKN CLS APL DERMABOND .7 (GAUZE/BANDAGES/DRESSINGS) ×2
APL PRP STRL LF DISP 70% ISPRP (MISCELLANEOUS) ×2
BALL CTTN LRG ABS STRL LF (GAUZE/BANDAGES/DRESSINGS)
BINDER BREAST 3XL (GAUZE/BANDAGES/DRESSINGS) IMPLANT
BINDER BREAST LRG (GAUZE/BANDAGES/DRESSINGS) IMPLANT
BINDER BREAST MEDIUM (GAUZE/BANDAGES/DRESSINGS) IMPLANT
BINDER BREAST XLRG (GAUZE/BANDAGES/DRESSINGS) IMPLANT
BINDER BREAST XXLRG (GAUZE/BANDAGES/DRESSINGS) IMPLANT
BLADE CLIPPER SURG (BLADE) IMPLANT
BLADE SURG 10 STRL SS (BLADE) ×4 IMPLANT
BLADE SURG 11 STRL SS (BLADE) IMPLANT
BLADE SURG 15 STRL LF DISP TIS (BLADE) ×1 IMPLANT
BLADE SURG 15 STRL SS (BLADE)
BNDG GAUZE DERMACEA FLUFF 4 (GAUZE/BANDAGES/DRESSINGS) ×2 IMPLANT
BNDG GZE DERMACEA 4 6PLY (GAUZE/BANDAGES/DRESSINGS) ×2
BRUSH SCRUB EZ PLAIN DRY (MISCELLANEOUS) ×1 IMPLANT
CANISTER SUCT 1200ML W/VALVE (MISCELLANEOUS) ×1 IMPLANT
CHLORAPREP W/TINT 26 (MISCELLANEOUS) ×2 IMPLANT
COTTONBALL LRG STERILE PKG (GAUZE/BANDAGES/DRESSINGS) IMPLANT
COVER BACK TABLE 60X90IN (DRAPES) ×1 IMPLANT
COVER MAYO STAND STRL (DRAPES) ×1 IMPLANT
DERMABOND ADVANCED .7 DNX12 (GAUZE/BANDAGES/DRESSINGS) ×2 IMPLANT
DRAIN CHANNEL 15F RND FF W/TCR (WOUND CARE) IMPLANT
DRAIN CHANNEL 19F RND (DRAIN) IMPLANT
DRAPE TOP ARMCOVERS (MISCELLANEOUS) ×1 IMPLANT
DRAPE U-SHAPE 76X120 STRL (DRAPES) ×1 IMPLANT
DRAPE UTILITY XL STRL (DRAPES) ×1 IMPLANT
DRSG EMULSION OIL 3X3 NADH (GAUZE/BANDAGES/DRESSINGS) ×1 IMPLANT
DRSG TEGADERM 4X10 (GAUZE/BANDAGES/DRESSINGS) IMPLANT
DRSG TEGADERM 4X4.75 (GAUZE/BANDAGES/DRESSINGS) IMPLANT
ELECT COATED BLADE 2.86 ST (ELECTRODE) ×1 IMPLANT
ELECT NDL BLADE 2-5/6 (NEEDLE) ×1 IMPLANT
ELECT NEEDLE BLADE 2-5/6 (NEEDLE) IMPLANT
ELECT REM PT RETURN 9FT ADLT (ELECTROSURGICAL) ×1
ELECTRODE REM PT RTRN 9FT ADLT (ELECTROSURGICAL) ×1 IMPLANT
EVACUATOR SILICONE 100CC (DRAIN) IMPLANT
GAUZE PAD ABD 8X10 STRL (GAUZE/BANDAGES/DRESSINGS) ×2 IMPLANT
GAUZE XEROFORM 1X8 LF (GAUZE/BANDAGES/DRESSINGS) IMPLANT
GAUZE XEROFORM 5X9 LF (GAUZE/BANDAGES/DRESSINGS) IMPLANT
GLOVE BIO SURGEON STRL SZ 6 (GLOVE) ×2 IMPLANT
GLOVE BIO SURGEON STRL SZ 6.5 (GLOVE) IMPLANT
GLOVE BIOGEL PI IND STRL 6.5 (GLOVE) IMPLANT
GOWN STRL REUS W/ TWL LRG LVL3 (GOWN DISPOSABLE) ×2 IMPLANT
GOWN STRL REUS W/TWL LRG LVL3 (GOWN DISPOSABLE) ×2
MARKER SKIN DUAL TIP RULER LAB (MISCELLANEOUS) IMPLANT
NDL HYPO 25X1 1.5 SAFETY (NEEDLE) ×1 IMPLANT
NDL HYPO 27GX1-1/4 (NEEDLE) IMPLANT
NDL SAFETY ECLIP 18X1.5 (MISCELLANEOUS) IMPLANT
NEEDLE HYPO 25X1 1.5 SAFETY (NEEDLE) ×1 IMPLANT
NEEDLE HYPO 27GX1-1/4 (NEEDLE) IMPLANT
NS IRRIG 1000ML POUR BTL (IV SOLUTION) ×1 IMPLANT
PACK BASIN DAY SURGERY FS (CUSTOM PROCEDURE TRAY) ×1 IMPLANT
PENCIL SMOKE EVACUATOR (MISCELLANEOUS) ×1 IMPLANT
PIN SAFETY STERILE (MISCELLANEOUS) ×1 IMPLANT
SHEET MEDIUM DRAPE 40X70 STRL (DRAPES) ×1 IMPLANT
SLEEVE SCD COMPRESS KNEE MED (STOCKING) ×1 IMPLANT
SPIKE FLUID TRANSFER (MISCELLANEOUS) IMPLANT
SPONGE GAUZE 2X2 8PLY STRL LF (GAUZE/BANDAGES/DRESSINGS) IMPLANT
SPONGE T-LAP 18X18 ~~LOC~~+RFID (SPONGE) ×3 IMPLANT
STAPLER VISISTAT 35W (STAPLE) ×1 IMPLANT
STRIP CLOSURE SKIN 1/2X4 (GAUZE/BANDAGES/DRESSINGS) IMPLANT
STRIP CLOSURE SKIN 1/4X4 (GAUZE/BANDAGES/DRESSINGS) IMPLANT
SUT CHROMIC 5 0 P 3 (SUTURE) IMPLANT
SUT ETHILON 2 0 FS 18 (SUTURE) IMPLANT
SUT MNCRL AB 4-0 PS2 18 (SUTURE) IMPLANT
SUT MON AB 5-0 P3 18 (SUTURE) IMPLANT
SUT PLAIN 5 0 P 3 18 (SUTURE) IMPLANT
SUT PROLENE 5 0 P 3 (SUTURE) IMPLANT
SUT PROLENE 5 0 PS 2 (SUTURE) IMPLANT
SUT PROLENE 6 0 P 1 18 (SUTURE) IMPLANT
SUT SILK 4 0 PS 2 (SUTURE) IMPLANT
SUT VIC AB 3-0 PS1 18 (SUTURE) ×8
SUT VIC AB 3-0 PS1 18XBRD (SUTURE) IMPLANT
SUT VICRYL 4-0 PS2 18IN ABS (SUTURE) IMPLANT
SUT VLOC 180 P-14 24 (SUTURE) ×2 IMPLANT
SYR BULB EAR ULCER 3OZ GRN STR (SYRINGE) IMPLANT
SYR BULB IRRIG 60ML STRL (SYRINGE) ×1 IMPLANT
SYR CONTROL 10ML LL (SYRINGE) ×1 IMPLANT
TOWEL GREEN STERILE FF (TOWEL DISPOSABLE) ×2 IMPLANT
TRAY DSU PREP LF (CUSTOM PROCEDURE TRAY) IMPLANT
TUBE CONNECTING 20X1/4 (TUBING) ×1 IMPLANT
UNDERPAD 30X36 HEAVY ABSORB (UNDERPADS AND DIAPERS) ×2 IMPLANT
YANKAUER SUCT BULB TIP NO VENT (SUCTIONS) ×1 IMPLANT

## 2021-12-06 NOTE — Anesthesia Postprocedure Evaluation (Signed)
Anesthesia Post Note  Patient: Christina [name]  Procedure(s) Performed: MAMMARY REDUCTION  (BREAST) (Bilateral: Breast)     Patient location during evaluation: PACU Anesthesia Type: General Level of consciousness: awake and alert Pain management: pain level controlled Vital Signs Assessment: post-procedure vital signs reviewed and stable Respiratory status: spontaneous breathing, nonlabored ventilation, respiratory function stable and patient connected to nasal cannula oxygen Cardiovascular status: blood pressure returned to baseline and stable Postop Assessment: no apparent nausea or vomiting Anesthetic complications: no   No notable events documented.  Last Vitals:  Vitals:   12/06/21 1145 12/06/21 1203  BP: (!) 156/99 (!) 152/107  Pulse: 75 76  Resp: 10 16  Temp:  36.8 C  SpO2: 93% 94%    Last Pain:  Vitals:   12/06/21 1203  TempSrc: Temporal  PainSc: [place] Quoin Siddh Vandeventer

## 2021-12-06 NOTE — Discharge Instructions (Addendum)
Post Anesthesia Home Care Instructions  Activity: Get plenty of rest for the remainder of the day. A responsible individual must stay with you for 24 hours following the procedure.  For the next 24 hours, DO NOT: -Drive a car -Paediatric nurse -Drink alcoholic beverages -Take any medication unless instructed by your physician -Make any legal decisions or sign important papers.  Meals: Start with liquid foods such as gelatin or soup. Progress to regular foods as tolerated. Avoid greasy, spicy, heavy foods. If nausea and/or vomiting occur, drink only clear liquids until the nausea and/or vomiting subsides. Call your physician if vomiting continues.  Special Instructions/Symptoms: Your throat may feel dry or sore from the anesthesia or the breathing tube placed in your throat during surgery. If this causes discomfort, gargle with warm salt water. The discomfort should disappear within 24 hours.  If you had a scopolamine patch placed behind your ear for the management of post- operative nausea and/or vomiting:  1. The medication in the patch is effective for 72 hours, after which it should be removed.  Wrap patch in a tissue and discard in the trash. Wash hands thoroughly with soap and water. 2. You may remove the patch earlier than 72 hours if you experience unpleasant side effects which may include dry mouth, dizziness or visual disturbances. 3. Avoid touching the patch. Wash your hands with soap and water after contact with the patch.   About my Jackson-Pratt Bulb Drain  What is a Jackson-Pratt bulb? A Jackson-Pratt is a soft, round device used to collect drainage. It is connected to a long, thin drainage catheter, which is held in place by one or two small stiches near your surgical incision site. When the bulb is squeezed, it forms a vacuum, forcing the drainage to empty into the bulb.  Emptying the Jackson-Pratt bulb- To empty the bulb: 1. Release the plug on the top of the  bulb. 2. Pour the bulb's contents into a measuring container which your nurse will provide. 3. Record the time emptied and amount of drainage. Empty the drain(s) as often as your     doctor or nurse recommends.  Date                  Time                    Amount (Drain 1)                 Amount (Drain 2)  _____________________________________________________________________  _____________________________________________________________________  _____________________________________________________________________  _____________________________________________________________________  _____________________________________________________________________  _____________________________________________________________________  _____________________________________________________________________  _____________________________________________________________________  Squeezing the Jackson-Pratt Bulb- To squeeze the bulb: 1. Make sure the plug at the top of the bulb is open. 2. Squeeze the bulb tightly in your fist. You will hear air squeezing from the bulb. 3. Replace the plug while the bulb is squeezed. 4. Use a safety pin to attach the bulb to your clothing. This will keep the catheter from     pulling at the bulb insertion site.  When to call your doctor- Call your doctor if: Drain site becomes red, swollen or hot. You have a fever greater than 101 degrees F. There is oozing at the drain site. Drain falls out (apply a guaze bandage over the drain hole and secure it with tape). Drainage increases daily not related to activity patterns. (You will usually have more drainage when you are active than when you are resting.) Drainage has a bad odor.     Next  dose of Tylenol can be given at 12:45pm if needed. Next dose of NSAID (Ibuprofen/Motrin/Aleve) can be given at 2:45pm if needed.

## 2021-12-06 NOTE — Transfer of Care (Signed)
Immediate Anesthesia Transfer of Care Note  Patient: Christina [name]  Procedure(s) Performed: MAMMARY REDUCTION  (BREAST) (Bilateral: Breast)  Patient Location: PACU  Anesthesia Type:General  Level of Consciousness: awake, drowsy, and patient cooperative  Airway & Oxygen Therapy: Patient Spontanous Breathing and Patient connected to face mask oxygen  Post-op Assessment: Report given to RN and Post -op Vital signs reviewed and stable  Post vital signs: Reviewed and stable  Last Vitals:  Vitals Value Taken Time  BP 158/112 12/06/21 1100  Temp    Pulse 71 12/06/21 1103  Resp 16 12/06/21 1103  SpO2 99 % 12/06/21 1103  Vitals shown include unvalidated device data.  Last Pain:  Vitals:   12/06/21 0634  TempSrc: Oral  PainSc: 0-No pain      Patients Stated Pain Goal: 6 (38/88/28 0034)  Complications: No notable events documented.

## 2021-12-06 NOTE — Op Note (Signed)
Operative Note   DATE OF OPERATION: 11.3.23  LOCATION: [facility]-outpatient  SURGICAL DIVISION: Plastic Surgery  PREOPERATIVE DIAGNOSES:  1. Macromasta 2. Chronic neck and back pain 3. Intertrigo  POSTOPERATIVE DIAGNOSES:  same  PROCEDURE:  Bilateral breast reduction  SURGEON: Irene Limbo MD MBA  ASSISTANT: Gentry Fitz RNFA  ANESTHESIA:  General.   EBL: 80 ml  COMPLICATIONS: None immediate.   INDICATIONS FOR PROCEDURE:  The patient, Christina [name], is a 41 y.o. female born on March 06, 1980, is here for treatment chronic neck and back pain, intertrigo, in setting of macromastia that has failed conservative measures.   FINDINGS: Right reduction 770 g Left reduction 719 g  DESCRIPTION OF PROCEDURE:  he patient was marked standing in the preoperative area to mark sternal notch, chest midline, anterior axillary lines, inframammary folds. The location of new nipple areolar complex was marked at level of on inframammary fold on anterior surface breast by palpation. This was marked symmetric over bilateral breasts. With aid of Wise pattern marker, location of new nipple areolar complex and vertical limbs (8 cm) were marked by displacement of breasts along meridian. The patient was taken to the operating room. SCDs were placed and IV antibiotics were given. The patient's operative site was prepped and draped in a sterile fashion. A time out was performed and all information was confirmed to be correct.     I began on left breast. Over left breast, superomedial pedicle marked and nipple areolar complex incised with 42 mm diameter marker. Pedicle deepithlialized and developed to chest wall. Breast tissue resected over lower pole. Medial and lateral flaps developed. Additional lateral breast tissue excised. Breast tailor tacked closed.    I then directed attention to right breast where superomedial pedicle designed. NAC incised with 42 mm diameter marker. The pedicle was  deepithelialized. Pedicle developed to chest wall. Breast tissue resected over lower pole. Medial and lateral flaps developed. Additional lateral breast tissue excised. Breast tailor tacked closed. Patient assessed for symmetry. Breast cavities irrigated and hemostasis obtained. Local anesthetic infiltrated throughout each breast. 15 Fr JP placed in each breast and secured with 2-0 nylon. Closure completed bilateral with 3-0 vicryl to approximate dermis along inframammary fold and vertical limb. NAC inset with 4-0 vicryl in dermis. Skin closure completed with 4-0 monocryl subcuticular throughout. Tissue adhesive applied. Dry dressing and breast binder applied.   The patient was allowed to wake from anesthesia, extubated and taken to the recovery room in satisfactory condition.   SPECIMENS: right and left breast reduction  DRAINS: 15 Fr JP in right and left breast  Irene Limbo, MD [facility]  Office/ physician access line after hours [phone]

## 2021-12-06 NOTE — Interval H&P Note (Signed)
History and Physical Interval Note:  12/06/2021 7:00 AM  Christina [name]  has presented today for surgery, with the diagnosis of macromastia, chronic neck and back pain, intertrigo.  The various methods of treatment have been discussed with the patient and family. After consideration of risks, benefits and other options for treatment, the patient has consented to  Procedure(s): MAMMARY REDUCTION  (BREAST) (Bilateral) AREOLA/NIPPLE RECONSTRUCTION WITH GRAFT (Bilateral) possible as a surgical intervention.  The patient's history has been reviewed, patient examined, no change in status, stable for surgery.  I have reviewed the patient's chart and labs.  Questions were answered to the patient's satisfaction.     Arnoldo Hooker Derrian Rodak

## 2021-12-07 ENCOUNTER — Encounter (HOSPITAL_BASED_OUTPATIENT_CLINIC_OR_DEPARTMENT_OTHER): Payer: Self-pay | Admitting: Plastic Surgery

## 2021-12-09 ENCOUNTER — Encounter: Payer: Self-pay | Admitting: Neurology

## 2021-12-09 LAB — SURGICAL PATHOLOGY

## 2021-12-18 ENCOUNTER — Emergency Department (HOSPITAL_COMMUNITY)
Admission: EM | Admit: 2021-12-18 | Discharge: 2021-12-18 | Discharge status: Against Medical Advice | Payer: BC Managed Care – PPO | Attending: Emergency Medicine | Admitting: Emergency Medicine

## 2021-12-18 ENCOUNTER — Other Ambulatory Visit: Payer: Self-pay

## 2021-12-18 ENCOUNTER — Encounter (HOSPITAL_COMMUNITY): Payer: Self-pay

## 2021-12-18 DIAGNOSIS — Z5321 Procedure and treatment not carried out due to patient leaving prior to being seen by health care provider: Secondary | ICD-10-CM | POA: Insufficient documentation

## 2021-12-18 DIAGNOSIS — Z7902 Long term (current) use of antithrombotics/antiplatelets: Secondary | ICD-10-CM | POA: Diagnosis not present

## 2021-12-18 DIAGNOSIS — R5383 Other fatigue: Secondary | ICD-10-CM | POA: Insufficient documentation

## 2021-12-18 LAB — CBC WITH DIFFERENTIAL/PLATELET
Abs Immature Granulocytes: 0.02 10*3/uL (ref 0.00–0.07)
Basophils Absolute: 0.1 10*3/uL (ref 0.0–0.1)
Basophils Relative: 1 %
Eosinophils Absolute: 0.3 10*3/uL (ref 0.0–0.5)
Eosinophils Relative: 4 %
HCT: 40.6 % (ref 36.0–46.0)
Hemoglobin: 12.9 g/dL (ref 12.0–15.0)
Immature Granulocytes: 0 %
Lymphocytes Relative: 41 %
Lymphs Abs: 2.6 10*3/uL (ref 0.7–4.0)
MCH: 31.9 pg (ref 26.0–34.0)
MCHC: 31.8 g/dL (ref 30.0–36.0)
MCV: 100.2 fL — ABNORMAL HIGH (ref 80.0–100.0)
Monocytes Absolute: 0.5 10*3/uL (ref 0.1–1.0)
Monocytes Relative: 7 %
Neutro Abs: 3 10*3/uL (ref 1.7–7.7)
Neutrophils Relative %: 47 %
Platelets: 422 10*3/uL — ABNORMAL HIGH (ref 150–400)
RBC: 4.05 MIL/uL (ref 3.87–5.11)
RDW: 12.4 % (ref 11.5–15.5)
WBC: 6.3 10*3/uL (ref 4.0–10.5)
nRBC: 0 % (ref 0.0–0.2)

## 2021-12-18 LAB — TYPE AND SCREEN
ABO/RH(D): AB POS
Antibody Screen: NEGATIVE

## 2021-12-18 LAB — BASIC METABOLIC PANEL
Anion gap: 9 (ref 5–15)
BUN: 9 mg/dL (ref 6–20)
CO2: 26 mmol/L (ref 22–32)
Calcium: 9.3 mg/dL (ref 8.9–10.3)
Chloride: 104 mmol/L (ref 98–111)
Creatinine, Ser: 0.75 mg/dL (ref 0.44–1.00)
GFR, Estimated: >60 mL/min (ref >60)
Glucose, Bld: 101 mg/dL — ABNORMAL HIGH (ref 70–99)
Potassium: 3.5 mmol/L (ref 3.5–5.1)
Sodium: 139 mmol/L (ref 135–145)

## 2021-12-18 LAB — PROTIME-INR
INR: 1.1 (ref 0.8–1.2)
Prothrombin Time: 14.5 seconds (ref 11.4–15.2)

## 2021-12-18 NOTE — ED Triage Notes (Signed)
Patient had drain pulled from left breast last Thursday and this am awoke with blood noted to gown. Reports burning to site. Minimal to no bleeding from site. Denies fever, no injury

## 2021-12-18 NOTE — ED Notes (Addendum)
Pt stated her surgeon told her that she could come to the office and stated she wanted to leave. Pt advised on risks of leaving.

## 2021-12-18 NOTE — ED Provider Triage Note (Signed)
Emergency Medicine Provider Triage Evaluation Note  Christina [name] , a 41 y.o. female  was evaluated in triage.  Pt complains of bleeding from drain removal site status post breast reduction at beginning of month. Has otherwise been well since surgery. Woke up with blood stained night gown on left tube site, still oozing persistently. Anticoagulated on Plavix. Stopped 5d prior to surgery and restarted day after surgery.   Review of Systems  Positive: Fatigue Negative: Fever, chills, pain at site, abdominal pain, breast pain  Physical Exam  BP (!) 133/100 (BP Location: Right Arm)   Pulse 81   Temp 98.6 F (37 C)   Resp 16   LMP 11/19/2021 (Exact Date) Comment: negative UPT 11/3  SpO2 100%  Gen:   Awake, no distress, no pallor Resp:  Normal effort  MSK:   Moves extremities without difficulty  Other:  Small site inferior to left breast from prior drain site post-breast reduction, constant ooze of blood, no tenderness, erythema, or signs of infection around site  Medical Decision Making  Medically screening exam initiated at 8:44 AM.  Appropriate orders placed.  Christina [name] was informed that the remainder of the evaluation will be completed by another provider, this initial triage assessment does not replace that evaluation, and the importance of remaining in the ED until their evaluation is complete.     Tonette Lederer, PA-C 12/18/21 5465

## 2022-01-28 NOTE — Progress Notes (Signed)
[facility] [address] [place]. [place] [address] [phone]       OFFICE FOLLOW-UP NOTE  [name] Date of Birth:  1980/05/22 Medical Record Number:  [id number]    Reason for visit: Stroke follow-up   HPI:   Initial visit 08/14/2020 Dr. Pearlean Brownie: Christina [name] is a 41 year old African-American lady seen today for initial office follow-up visit following hospital admission for stroke in April 2022.  History is obtained from the patient, review of electronic medical records and I personally reviewed available pertinent imaging films in PACS. She has past medical history of diabetes, hypertension, hyperlipidemia and asthma.  She presented on 05/27/2020 to the emergency room with sudden onset of left hemiparesis, left-sided neglect and right gaze preference.  Code stroke was activated and CT angiogram showed right M1 stenosis.  Patient was given IV tPA and went for emergent mechanical thrombectomy which was successfully performed she was kept in the ICU and blood pressure adequately controlled.  Follow-up MRI scan showed small patchy infarcts in the right MCA and right MCA/ACA and PCA watershed territories.  2D echo showed normal ejection fraction with grade 2 diastolic dysfunction.  Transesophageal echo showed no cardiac source of embolism or PFO.  Transcranial Doppler bubble study was negative for PFO.  Lower extremity venous Dopplers was negative for DVT.  Hypercoagulable labs showed normal anticardiolipin antibodies and lupus anticoagulant was negative.  ANA was negative.  Hemoglobin A1c was elevated at 9.7 and LDL cholesterol was low at 15 mg percent.  Patient was advised to quit smoking.  She has placed on dual antiplatelet therapy for 3 weeks followed by aspirin alone.  She is done well.  She states she has no residual deficits from her stroke.  She feels that short-term memory at times may be difficult and she may have trouble with multitasking.  She is tolerating  aspirin well without bruising or bleeding.  Blood pressure she claims is well controlled but today it is elevated in office at 157/109.  She is tolerating Crestor well without muscle aches and pains and states her sugars have all been good.  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  She has no new complaints today.  She is likely at risk for sleep apnea and has not yet had a polysomnogram for sleep evaluation.  There is no family history of strokes or heart attacks at a young age.  She has no history of DVT, pulmonary embolism or migraines. Update 04/23/2021 Dr. Pearlean Brownie; patient is seen for follow-up to last visit 8 months ago.  She was seen at [facility] on 03/21/2021 with sudden onset of right face droop as well as body numbness and weakness the symptoms are quite fluctuating.  Her symptoms started improving by the time she reached the ED.  CT head was unremarkable CT angiogram showed a filling defect in the proximal right carotid possibly a clot.  MRI scan showed no acute infarct and borderline Chiari I malformation.  MRI of the neck showed similar finding to CTA suggestive of possible clot.  2D echo showed normal ejection fraction without obvious cardiac source of embolism.  Lower extremity venous Dopplers were negative for DVT.  LDL cholesterol is 34 mg percent.  Hemoglobin A1c was 5.2.  Urine drug screen was positive for cannabis.  Patient was started on IV heparin suspected clot in the carotid and subsequently switched to Eliquis.  Patient had previously stopped aspirin following her stroke in April 2022 in September  because of increased menstrual bleeding.  Patient states after she went to the hospital she was seen in [facility] ER on 03/27/2021 for recurrent left-sided symptoms.  MRI of the brain this time interestingly showed multiple acute infarcts in the right frontal cortex in the right anterior MCA territory with a small foci of susceptibility artifact with T2 hyperintensity  compatible with small hemorrhage.  CT scan of the head was done but no acute hemorrhage was noted.  Also had MR angiogram of the neck and the brain showed complete resolution of the filling defect in the proximal right carotid seen previously.  Patient has remained on Eliquis which is tolerating well without bruising or bleeding.  She plans to see OB/GYN Dr. For a hormone implant to reduce her menstrual bleeding.  Patient had been referred by me for for sleep apnea testing at last visit but this has not yet happened but she has an appointment later this week for the same.  She states her blood pressure is been running up and down and primary care physician has recently added amlodipine.  Today it is 139/104.  Blood sugars have all been under good control.  She has not had any further recurrent episodes of weakness or numbness in the last 1 month.  Update 07/29/2021 JM: Patient returns for 82-month stroke follow-up unaccompanied   Overall stable since prior visit without new stroke/TIA symptoms.  Reports residual left hand weakness, left sensory impairment, occasional gait impairment and short-term memory loss.  Scheduled to see neuropsychology 7/3. Does admit to increased stressors, becomes tearful during visit.  Not currently working and currently in the process of applying for Social Security disability. She is greatly frustrated that she no longer works and is not able to provide for her family. She lives with her husband who still works and has 3 children aged 31, 68 and 1.  She does not do any routine physical exercise or memory exercises.  Compliant on Plavix and Crestor, continues to have heavy menstrual cycles but denies any other side effects.  She continues to follow with OB/GYN, is scheduled 7/19 to discuss possible ablation in hopes of controlling periods.   Blood pressure today 134/96.    Completed sleep study back in April with Dr. Frances Furbish which showed overall mild OSA although moderate in rem  sleep and recommended initiation of AutoPap which was started on 6/8.  She has initial CPAP compliance visit with Dr. Frances Furbish on 8/10.  Does endorse compliance on CPAP nightly since receiving machine, has already noted benefit with use.   She was seen by rheumatology last month and ruled out connective tissue disorder.    No further concerns at this time.   Update 01/29/2022 JM: Patient returns for 63-month stroke follow-up.  Overall stable without new stroke/TIA symptoms.  Residual left-sided deficits and cognition stable.  Continued left facial numbness, feels this has worsened over the past month, feels more of a Novocain sensation like she initially felt when she first had her stroke, tongue feels heavy, more numbness in gums and roof of mouth on left side, has been biting her cheek more on left side. Has been having more jaw pain L>R.  Denies any worsening left arm or leg symptoms or new stroke/TIA symptoms.  Also mentions continued mental exhaustion, will work for [employer] for a couple days but feels like she overexerts herself and then will need to take time off.   Underwent neurocognitive evaluation on 08/05/2021 which was grossly normal with no clear  abnormal patterns aside from some possible mild visual spatial/visual constructional difficulties and consider evaluation by neuro-ophthalmology.  Also noted anxiety and depression may be impacting her presentation.  Recommended consideration of reevaluation in 1 year if needed. Was seen by PCP since evaluation, patient not interested in medication management. Declined referral for neuro-ophthalmology at that time.   Remains on Plavix and Crestor Blood pressure elevated today, has f/u soon with PCP and cards to further discuss Reports nightly use of CPAP, routinely follows with Dr. Frances Furbish      ROS:   14 system review of systems is positive for those listed in HPI and all the systems negative  PMH:  Past Medical History:  Diagnosis Date   Asthma     Diabetes mellitus without complication (HCC)    GERD (gastroesophageal reflux disease)    Hypertension    Sleep apnea    Stroke (HCC) 03/29/2020   some left sided weakness    Social History:  Social History   Socioeconomic History   Marital status: Married    Spouse name: Ternell   Number of children: Not on file   Years of education: Not on file   Highest education level: Not on file  Occupational History   Not on file  Tobacco Use   Smoking status: Former    Packs/day: 0.25    Types: Cigarettes   Smokeless tobacco: Never  Vaping Use   Vaping Use: Never used  Substance and Sexual Activity   Alcohol use: Yes    Comment: occ   Drug use: No   Sexual activity: Not on file  Other Topics Concern   Not on file  Social History Narrative   Lives with husband and 3 children   Right Handed   Drinks caffeine occasionally   Social Determinants of Health   Financial Resource Strain: Not on file  Food Insecurity: Not on file  Transportation Needs: Not on file  Physical Activity: Not on file  Stress: Not on file  Social Connections: Not on file  Intimate Partner Violence: Not on file    Medications:   Current Outpatient Medications on File Prior to Visit  Medication Sig Dispense Refill   albuterol (PROVENTIL HFA;VENTOLIN HFA) 108 (90 BASE) MCG/ACT inhaler Inhale 2 puffs into the lungs every 6 (six) hours as needed for wheezing.     amLODipine (NORVASC) 10 MG tablet Take 10 mg by mouth daily.     clopidogrel (PLAVIX) 75 MG tablet Take 1 tablet (75 mg total) by mouth daily. 30 tablet 11   ergocalciferol (VITAMIN D2) 1.25 MG (50000 UT) capsule Take 50,000 Units by mouth every Monday.     loratadine (CLARITIN) 10 MG tablet Take 10 mg by mouth daily.     metoprolol tartrate (LOPRESSOR) 100 MG tablet Take 100 mg by mouth daily.     ondansetron (ZOFRAN ODT) 4 MG disintegrating tablet Take 1 tablet (4 mg total) by mouth every 8 (eight) hours as needed for nausea or vomiting. 20  tablet 0   OZEMPIC, 2 MG/DOSE, 8 MG/3ML SOPN SMARTSIG:0.75 Milliliter(s) SUB-Q Once a Week     prednisoLONE acetate (PRED FORTE) 1 % ophthalmic suspension Place 1 drop into both eyes as needed.     predniSONE (DELTASONE) 50 MG tablet Take 1 tablet (50 mg total) by mouth daily with breakfast. 5 tablet 0   rosuvastatin (CRESTOR) 20 MG tablet Take 1 tablet (20 mg total) by mouth daily. 90 tablet 0   No current facility-administered medications on file  prior to visit.    Allergies:   Allergies  Allergen Reactions   Peanut-Containing Drug Products Anaphylaxis, Shortness Of Breath and Swelling   Lisinopril Other (See Comments)    Lip swelling   Penicillins Rash   Tomato Rash    Fresh tomatoes     Physical Exam Today's Vitals   01/29/22 1112  BP: (!) 143/100  Pulse: 62  Weight: 215 lb 3.2 oz (97.6 kg)  Height: 5\' 7"  (1.702 m)   Body mass index is 33.71 kg/m.  General: Morbidly obese young very pleasant African-American lady, seated, in no evident distress Head: head normocephalic and atraumatic.  Neck: supple with no carotid or supraclavicular bruits Cardiovascular: regular rate and rhythm, no murmurs Musculoskeletal: no deformity Skin:  no rash/petichiae Vascular:  Normal pulses all extremities  Neurologic Exam Mental Status: Awake and fully alert.  Fluent speech and language.  Oriented to place and time. Recent memory impaired and remote memory intact. Attention span, concentration and fund of knowledge appropriate during visit. Mood and affect appropriate.  Cranial Nerves: Pupils equal, briskly reactive to light. Extraocular movements full without nystagmus. Visual fields full to confrontation. Hearing intact.  Subjective left-sided facial numbness with decreased lower facial sensation to light touch (similar to prior visit). Face, tongue, palate moves normally and symmetrically.  Motor: Normal bulk and tone. Normal strength in all tested extremity muscles except slightly  diminished fine finger movements on the left.  Orbits right over left upper extremity. Sensory.: intact to touch ,pinprick .position and vibratory sensation.  Subjective numbness left upper and lower extremity Coordination: Rapid alternating movements normal in all extremities. Finger-to-nose and heel-to-shin performed accurately bilaterally. Gait and Station: Arises from chair without difficulty. Stance is normal. Gait demonstrates normal stride length and mild imbalance without use of assistive device.  Difficulty performing tandem walk and heel toe.  Reflexes: 1+ and symmetric. Toes downgoing.       ASSESSMENT/PLAN: 41 year old African-American lady with Cryptogenic acute infarcts within the right MCA and MCA/ACA and MCA/PCA watershed territories with M1 stenosis s/p tPA and thrombectomy with complete revascularization of occluded RT MCA M 1 achieving a TICI 3 revascularization in April 2022 with great recovery and practically no residual deficits.  Readmission on 03/21/2021 for likely bilateral hemisphere TIA with recurrent right body paresthesias and L hand numbness with MRI negative for acute stroke but CTA showed new right ICA thrombus and placed on Eliquis (d/c'd 04/2021 d/t heavy menstrual bleeding and resolution of thrombus on repeat imaging).  Episode of numbness of the tongue on the left with difficulty walking on 03/27/2021 and seen in [facility] ER where MRI showed multiple small subacute right MCA territory infarct and repeat MRI showed resolution of the previously seen filling defect in the proximal right carotid. Vascular risk factors of diabetes, hypertension, hyperlipidemia , obesity and OSA now on CPAP    Worsening left facial numbness, jaw pain Due to hx of multiple recurrent strokes, will obtain MRI brain to ensure no new stroke or extension of prior stroke although low suspicion as neuro exam intact/stable since prior exam and symptoms do not include arm or leg nor any other stroke  symptoms Will check B12 and TSH today to rule out reversible causes of numbness Possibly in setting of increased stressors which were discussed If jaw pain persists, would recommend further evaluation with dentistry Advised any worsening stroke symptoms or new stroke/TIA symptoms, to call 911 immediately for more emergent evaluation  Multiple territory strokes B/l TIA Residual deficit: left  hand weakness, left sided paresthesias and subjective cognitive impairment (neuro cog eval largely within normal limits). Noted anxiety/depression may be contributing to cognitive complaints, will defer management/monitoring to PCP.  Continue clopidogrel 75 mg daily  and Crestor for secondary stroke prevention.  Close PCP f/u for aggressive stroke risk factor management including BP goal<130/90, DM with A1c goal<7 and HLD with LDL goal<70  OSA: followed by Dr. Frances Furbish -initiated CPAP 6/8 after HST confirmed sleep apnea.  Discussed importance of continued nightly CPAP compliance.  Has follow-up visit scheduled with Dr. Frances Furbish 09/08/2022    Plan on f/u as needed at this time as no further recommendations as stroke risk factors managed by PCP and cardiology    CC:  Clearnce Hasten, PA-C   I spent 31 minutes of face-to-face and non-face-to-face time with patient.  This included previsit chart review, lab review, study review, order entry, electronic health record documentation, patient education and discussion regarding above diagnoses and treatment plan and answered all the questions to patient's satisfaction  Ihor Austin, [facility]  [facility] [address] [place], [place] [address]  Phone [phone] Fax [phone] Note: This document was prepared with digital dictation and possible smart phrase technology. Any transcriptional errors that result from this process are unintentional.

## 2022-01-29 ENCOUNTER — Encounter: Payer: Self-pay | Admitting: Adult Health

## 2022-01-29 ENCOUNTER — Ambulatory Visit (INDEPENDENT_AMBULATORY_CARE_PROVIDER_SITE_OTHER): Payer: BC Managed Care – PPO | Admitting: Adult Health

## 2022-01-29 VITALS — BP 143/100 | HR 62 | Ht 67.0 in | Wt 215.2 lb

## 2022-01-29 DIAGNOSIS — R200 Anesthesia of skin: Secondary | ICD-10-CM | POA: Diagnosis not present

## 2022-01-29 DIAGNOSIS — Z8673 Personal history of transient ischemic attack (TIA), and cerebral infarction without residual deficits: Secondary | ICD-10-CM | POA: Diagnosis not present

## 2022-01-29 DIAGNOSIS — E538 Deficiency of other specified B group vitamins: Secondary | ICD-10-CM

## 2022-01-29 DIAGNOSIS — I639 Cerebral infarction, unspecified: Secondary | ICD-10-CM | POA: Diagnosis not present

## 2022-01-29 NOTE — Patient Instructions (Addendum)
You will be called to repeat MRI brain to ensure no new stroke with worsening left facial numbness. If you start to have worsening numbness on left side of body or new stroke/TIA symptoms, please call 911 immediately for emergent evaluation   We will check lab work today, we will let you know via MyChart if there is any concerning findings   Continue clopidogrel 75 mg daily  and Crestor for secondary stroke prevention  Continue to follow up with PCP regarding blood pressure and cholesterol management  Maintain strict control of hypertension with blood pressure goal below 130/90 and cholesterol with LDL cholesterol (bad cholesterol) goal below 70 mg/dL.   Signs of a Stroke? Follow the BEFAST method:  Balance Watch for a sudden loss of balance, trouble with coordination or vertigo Eyes Is there a sudden loss of vision in one or both eyes? Or double vision?  Face: Ask the person to smile. Does one side of the face droop or is it numb?  Arms: Ask the person to raise both arms. Does one arm drift downward? Is there weakness or numbness of a leg? Speech: Ask the person to repeat a simple phrase. Does the speech sound slurred/strange? Is the person confused ? Time: If you observe any of these signs, call 911.       Thank you for coming to see [place] at [facility]. I hope we have been able to provide you high quality care today.  You may receive a patient satisfaction survey over the next few weeks. We would appreciate your feedback and comments so that we may continue to improve ourselves and the health of our patients.

## 2022-01-30 LAB — TSH: TSH: 1 u[IU]/mL (ref 0.450–4.500)

## 2022-01-30 LAB — VITAMIN B12: Vitamin B-12: 650 pg/mL (ref 232–1245)

## 2022-02-07 ENCOUNTER — Telehealth: Payer: Self-pay | Admitting: Adult Health

## 2022-02-07 NOTE — Telephone Encounter (Signed)
[employer] via [employer] sent to GI [phone] 

## 2022-02-25 ENCOUNTER — Ambulatory Visit
Admission: RE | Admit: 2022-02-25 | Discharge: 2022-02-25 | Disposition: A | Discharge status: Home / Self Care | Payer: BC Managed Care – PPO | Source: Ambulatory Visit | Attending: Adult Health | Admitting: Adult Health

## 2022-02-25 DIAGNOSIS — R200 Anesthesia of skin: Secondary | ICD-10-CM

## 2022-02-25 DIAGNOSIS — I613 Nontraumatic intracerebral hemorrhage in brain stem: Secondary | ICD-10-CM | POA: Diagnosis not present

## 2022-03-16 ENCOUNTER — Other Ambulatory Visit: Payer: Self-pay | Admitting: Neurology

## 2022-03-17 NOTE — Telephone Encounter (Signed)
Rx refilled.

## 2022-09-08 ENCOUNTER — Ambulatory Visit: Payer: BC Managed Care – PPO | Admitting: Neurology

## 2022-09-24 ENCOUNTER — Ambulatory Visit (INDEPENDENT_AMBULATORY_CARE_PROVIDER_SITE_OTHER): Payer: BC Managed Care – PPO | Admitting: Neurology

## 2022-09-24 ENCOUNTER — Encounter: Payer: Self-pay | Admitting: Neurology

## 2022-09-24 VITALS — BP 173/122 | HR 69 | Ht 67.0 in | Wt 216.0 lb

## 2022-09-24 DIAGNOSIS — R0981 Nasal congestion: Secondary | ICD-10-CM | POA: Diagnosis not present

## 2022-09-24 DIAGNOSIS — G4733 Obstructive sleep apnea (adult) (pediatric): Secondary | ICD-10-CM | POA: Diagnosis not present

## 2022-09-24 DIAGNOSIS — R030 Elevated blood-pressure reading, without diagnosis of hypertension: Secondary | ICD-10-CM

## 2022-09-24 NOTE — Progress Notes (Signed)
Subjective:    Patient ID: Christina [name] is a 42 y.o. female.  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  The patient is unaccompanied today. I last saw Christina [name] on 09/12/2021, at which time Christina [name] was compliant with Christina [name] autoPAP and endorsed benefit from it. Christina [name] was advised to follow up routinely in one year.   Today, 09/24/2022: I reviewed Christina [name] AutoPap compliance data for the past year, Christina [name] used Christina [name] machine only sporadically, total of 41 out of 180 days.  Has not used Christina [name] machine essentially since early July 2024.  Christina [name] reports that Christina [name] dog chewed on the hose, Christina [name] has not contacted Christina [name] DME provider yet.  Christina [name] endorses nasal congestion and it is hard for Christina [name] to breathe with the nasal mask on, Christina [name] has not tried a fullface mask.  Christina [name] has not taken Christina [name] blood pressure medication, Christina [name] reports that Christina [name] needs to pick up Christina [name] prescription, reports that Christina [name] has been off of Christina [name] blood pressure medication only for a day.  Christina [name] had elevated blood pressure readings today repeatedly.  Christina [name] denies any chest pain, shortness of breath, blurry vision or headache.  Christina [name] has some trouble falling asleep, Christina [name] has daytime tiredness, Epworth sleepiness score is 14 out of 24 currently.  Christina [name] has tried melatonin which helps but does not consistently.   The patient's allergies, current medications, family history, past medical history, past social history, past surgical history and problem list were reviewed and updated as appropriate.   Previously:    I first met Christina [name] at the request of Dr. Pearlean Brownie on 04/24/2021, at which time Christina [name] reported snoring and excessive daytime somnolence.  Christina [name] Epworth  sleepiness score was 14 at that time.  Christina [name] was advised to proceed with a sleep study.  Christina [name] had a baseline sleep study on 06/06/2021 which showed a sleep efficiency of 73.7%, sleep latency delayed at 95 minutes, REM latency 63 minutes.  Christina [name] achieved 21.2% of REM sleep.  Total AHI was in the borderline range at 5.6/h, REM AHI was 17.9/h, supine sleep was not achieved.  Average oxygen saturation was 93%, nadir was 82%.  Christina [name] average oxygen saturation during sleep was close to 88% most of the time.  Christina [name] was advised to pursue weight loss and optimization of Christina [name] asthma as well as treatment with AutoPap therapy.  Christina [name] set up date was 07/11/2021.  Christina [name] has a ResMed AirSense 11 AutoSet machine.   I reviewed Christina [name] AutoPap compliance data from 08/07/2021 through 09/05/2021, which is a total of 30 days, during which time Christina [name] used Christina [name] machine 23 days with percent use days greater than 4 hours at 70%, indicating adequate compliance with an average usage of 6 hours and 42 minutes, residual AHI at goal at 0.6/h, average pressure for the 95th percentile at 9.8 cm with a range of 6 to 12 cm with EPR.  Leak acceptable with the 95th percentile at 12.8 L/min.     04/24/21: (Christina [name]) reports snoring and excessive daytime somnolence.  I reviewed your office note from 08/14/2020 as well as 04/23/2021.  Christina [name] Epworth sleepiness score is 14 out of 24, fatigue severity score is 18 out of 63. Christina [name] has been working on  weight loss since Christina [name] stroke and has lost quite a bit of weight.  Christina [name] has quit smoking since Christina [name] stroke in April 2022.  Christina [name] is currently not working.  Christina [name] goes to bed around midnight and rise time is around 6 AM.  Lives with Christina [name] husband and 2 younger sons, Christina [name] older son in college.  Christina [name] does not drink caffeine daily, drinks alcohol rarely, maybe once or twice a month at the most.  Christina [name] is not aware of any family history of sleep apnea, denies recurrent morning headaches or night to night nocturia.   Christina [name] Past Medical History Is Significant  For: Past Medical History:  Diagnosis Date   Asthma    Diabetes mellitus without complication (HCC)    GERD (gastroesophageal reflux disease)    Hypertension    Sleep apnea    Stroke (HCC) 03/29/2020   some left sided weakness    Christina [name] Past Surgical History Is Significant For: Past Surgical History:  Procedure Laterality Date   BREAST REDUCTION SURGERY Bilateral 12/06/2021   Procedure: MAMMARY REDUCTION  (BREAST);  Surgeon: Glenna Fellows, MD;  Location: [facility];  Service: Plastics;  Laterality: Bilateral;   BUBBLE STUDY  05/29/2020   Procedure: BUBBLE STUDY;  Surgeon: Little Ishikawa, MD;  Location: [facility] ENDOSCOPY;  Service: Cardiovascular;;   IR CT HEAD LTD  05/27/2020   IR PERCUTANEOUS ART THROMBECTOMY/INFUSION INTRACRANIAL INC DIAG ANGIO  05/27/2020   RADIOLOGY WITH ANESTHESIA N/A 05/27/2020   Procedure: IR WITH ANESTHESIA;  Surgeon: Radiologist, Medication, MD;  Location: [facility] OR;  Service: Radiology;  Laterality: N/A;   TEE WITHOUT CARDIOVERSION N/A 05/29/2020   Procedure: TRANSESOPHAGEAL ECHOCARDIOGRAM (TEE);  Surgeon: Little Ishikawa, MD;  Location: [facility] END[facility]COPY;  Service: Cardiovascular;  Laterality: N/A;   TUBAL LIGATION      Christina [name] Family History Is Significant For: History reviewed. No pertinent family history.  Christina [name] Social History Is Significant For: Social History   Socioeconomic History   Marital status: Married    Spouse name: Ternell   Number of children: Not on file   Years of education: Not on file   Highest education level: Not on file  Occupational History   Not on file  Tobacco Use   Smoking status: Former    Current packs/day: 0.25    Types: Cigarettes   Smokeless tobacco: Never  Vaping Use   Vaping status: Never Used  Substance and Sexual Activity   Alcohol use: Yes    Comment: occ   Drug use: No   Sexual activity: Not on file  Other Topics Concern   Not on file  Social History Narrative   Lives with husband and 3  children   Right Handed   Drinks caffeine occasionally   Social Determinants of Health   Financial Resource Strain: Medium Risk (10/04/2019)   Received from [facility] visits prior to 04/05/2022., [facility] visits prior to 04/05/2022.   Overall Financial Resource Strain (CARDIA)    Difficulty of Paying Living Expenses: Somewhat hard  Food Insecurity: High Risk (06/06/2022)   [facility]   Food vital sign    Within the past 12 months, you worried that your food would run out before you got money to buy more: Often true    Within the past 12 months, the food you bought just didn't last and you didn't have money to get more. : Often true  Transportation Needs: Not on file (06/06/2022)  Physical Activity: Inactive (10/04/2019)   Received [facility] visits prior to 04/05/2022., [facility] visits prior to 04/05/2022.   Exercise Vital Sign    Days of Exercise per Week: 0 days    Minutes of Exercise per Session: 0 min  Stress: Stress Concern Present (10/04/2019)   [facility] prior to 04/05/2022., [facility] visits prior to 04/05/2022.   [employer] of Occupational Health - Occupational Stress Questionnaire    Feeling of Stress : To some extent  Social Connections: Moderately Integrated (10/04/2019)   Received from [facility] visits prior to 04/05/2022., [facility] visits prior to 04/05/2022.   Social Advertising account executive [NHANES]    Frequency of Communication with Friends and Family: More than three times a week    Frequency of Social Gatherings with Friends and Family: Twice a week    Attends Religious Services: 1 to 4 times per year    Active Member of [employer] or Organizations: No    Attends Engineer, structural: Never    Marital Status: Married     Christina [name] Allergies Are:  Allergies  Allergen Reactions   Peanut-Containing Drug Products Anaphylaxis, Shortness Of Breath and Swelling   Lisinopril Other (See Comments)    Lip swelling   Penicillins Rash   Tomato Rash    Fresh tomatoes   :   Christina [name] Current Medications Are:  Outpatient Encounter Medications as of 09/24/2022  Medication Sig   albuterol (PROVENTIL HFA;VENTOLIN HFA) 108 (90 BASE) MCG/ACT inhaler Inhale 2 puffs into the lungs every 6 (six) hours as needed for wheezing.   amLODipine (NORVASC) 10 MG tablet Take 10 mg by mouth daily.   clopidogrel (PLAVIX) 75 MG tablet TAKE 1 TABLET BY MOUTH EVERY DAY   ergocalciferol (VITAMIN D2) 1.25 MG (50000 UT) capsule Take 50,000 Units by mouth every Monday.   loratadine (CLARITIN) 10 MG tablet Take 10 mg by mouth daily.   metoprolol tartrate (LOPRESSOR) 100 MG tablet Take 100 mg by mouth daily.   ondansetron (ZOFRAN ODT) 4 MG disintegrating tablet Take 1 tablet (4 mg total) by mouth every 8 (eight) hours as needed for nausea or vomiting.   OZEMPIC, 2 MG/DOSE, 8 MG/3ML SOPN SMARTSIG:0.75 Milliliter(s) SUB-Q Once a Week   prednisoLONE acetate (PRED FORTE) 1 % ophthalmic suspension Place 1 drop into both eyes as needed.   predniSONE (DELTASONE) 50 MG tablet Take 1 tablet (50 mg total) by mouth daily with breakfast.   sertraline (ZOLOFT) 100 MG tablet Take by mouth.   rosuvastatin (CRESTOR) 20 MG tablet Take 1 tablet (20 mg total) by mouth daily.   No facility-administered encounter medications on file as of 09/24/2022.  :  Review of Systems:  Out of a complete 14 point review of systems, all are reviewed and negative with the exception of these symptoms as listed below:  Review of Systems  Neurological:        Rm 5 alone Pt is well and stable, reports Christina [name] hasn't used cpap in the last month.     Objective:  Neurological Exam  Physical Exam Physical Examination:   Vitals:   09/24/22 1103 09/24/22 1117  BP: (!) 162/104 (!) 173/122   Pulse: 73 69  SpO2: 93%    General Examination: The patient is a very pleasant 42 y.o. female in no acute distress. Christina [name] appears well-developed and well-nourished and well  groomed.  Christina [name] denies any blurry vision, headache, shortness of breath or chest pain.  HEENT: Normocephalic, atraumatic, pupils are equal, round and reactive to light, no photophobia.  Extraocular tracking is good without limitation to gaze excursion or nystagmus noted. Hearing is grossly intact. Face is symmetric with normal facial animation. Speech is clear but sounds nasal.  Christina [name] has nasal congestion.  There is no hypophonia or dysarthria. There is no lip, neck/head, jaw or voice tremor. Neck with FROM. There are no carotid bruits on auscultation. Oropharynx exam reveals: mild mouth dryness, adequate dental hygiene and mild airway crowding.  Tongue protrudes centrally and palate elevates symmetrically.   Chest: Clear to auscultation without wheezing, rhonchi or crackles noted.  Heart monitor in place.   Heart: S1+S2+0, regular and normal without murmurs, rubs or gallops noted.    Abdomen: Soft, non-tender and non-distended.   Extremities: There is obvious swelling in the distal lower extremities bilaterally.    Skin: Warm and dry without trophic changes noted.    Musculoskeletal: exam reveals no obvious joint deformities.    Neurologically:  Mental status: The patient is awake, alert and oriented in all 4 spheres. Christina [name] immediate and remote memory, attention, language skills and fund of knowledge are appropriate. There is no evidence of aphasia, agnosia, apraxia or anomia. Speech is clear with normal prosody and enunciation. Thought process is linear. Mood is normal and affect is normal.  Cranial nerves II - XII are as described above under HEENT exam.  Motor exam: Normal bulk, moving all 4 extremities without difficulty or restriction, no obvious resting or action tremor.  Fine motor skills and coordination: grossly intact.   Cerebellar testing: No dysmetria or intention tremor. There is no truncal or gait ataxia.  Sensory exam: intact to light touch in the upper and lower extremities.  Gait, station and balance: Christina [name] stands easily. No veering to one side is noted. No leaning to one side is noted. Posture is age-appropriate and stance is narrow based. Gait shows normal stride length and normal pace. No problems turning are noted.    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  Christina [name] is currently not compliant with Christina [name] AutoPap machine.  Christina [name] is advised to get in touch with Christina [name] DME provider to get new supplies including a new hose but also talk about a different mask such as a fullface mask as Christina [name] does have nasal congestion and allergy flareup from time to time.  Christina [name] is advised to get in touch with Christina [name] PCP today and get seen for high blood pressure, Christina [name] is encouraged to fill Christina [name] prescription of Christina [name] blood pressure medication as soon as possible after this visit.  If Christina [name] cannot be seen by PCP, Christina [name] is advised to go to urgent care.  Should Christina [name] develop any sudden onset of chest pain or shortness of breath or blurry vision or headache, Christina [name] is strongly advised to proceed to the emergency room immediately.  Christina [name] is advised to follow-up routinely in sleep clinic to see Ihor Austin, NP in 1 year.  For sleep onset difficulty, Christina [name] is encouraged to try melatonin, 5 to 10 mg strength.  I do not suggest any prescription sleep aid for  Christina [name] at this time.  I answered all Christina [name] questions today and Christina [name] was in agreement. I spent 30 minutes in total face-to-face time and in reviewing records during  pre-charting, more than 50% of which was spent in counseling and coordination of care, reviewing test results, reviewing medications and treatment regimen and/or in discussing or reviewing the diagnosis of OSA, the prognosis and treatment options. Pertinent laboratory and imaging test results that were available during this visit with the patient were reviewed by me and considered in my medical decision making (see chart for details).

## 2022-09-24 NOTE — Patient Instructions (Addendum)
Your blood pressure is very high, repeatedly during, today's visit.   Please get in touch with your primary care after this visit so you can get seen today.  Please pick up your prescriptions for your blood pressure medicines.  If you have shortness of breath or chest pain or headache or blurry vision, please proceed immediately to the ER.  If your primary care cannot see you today, please go to urgent care.  Please use your autoPAP regularly. While your insurance typically requires that you use PAP at least 4 hours each night on 70% of the nights, I recommend, that you not skip any nights and use it throughout the night if you can. Getting used to PAP and staying with the treatment long term does take time and patience and discipline. Untreated obstructive sleep apnea when it is moderate to severe can have an adverse impact on cardiovascular health and raise her risk for heart disease, arrhythmias, hypertension, congestive heart failure, stroke and diabetes. Untreated obstructive sleep apnea causes sleep disruption, nonrestorative sleep, and sleep deprivation. This can have an impact on your day to day functioning and cause daytime sleepiness and impairment of cognitive function, memory loss, mood disturbance, and problems focussing. Using PAP regularly can improve these symptoms.  Contact your DME provider about new supplies including a new hose as well as a different mask such as a fullface mask as you do have nasal congestion.  Follow up to see Shanda Bumps, NP in 1 year.

## 2022-09-25 ENCOUNTER — Encounter (HOSPITAL_COMMUNITY): Payer: Self-pay

## 2022-09-25 ENCOUNTER — Emergency Department (HOSPITAL_COMMUNITY): Payer: BC Managed Care – PPO

## 2022-09-25 ENCOUNTER — Emergency Department (HOSPITAL_COMMUNITY)
Admission: EM | Admit: 2022-09-25 | Discharge: 2022-09-25 | Disposition: A | Discharge status: Home / Self Care | Payer: BC Managed Care – PPO

## 2022-09-25 DIAGNOSIS — Z79899 Other long term (current) drug therapy: Secondary | ICD-10-CM | POA: Diagnosis not present

## 2022-09-25 DIAGNOSIS — Z91010 Allergy to peanuts: Secondary | ICD-10-CM | POA: Insufficient documentation

## 2022-09-25 DIAGNOSIS — Z794 Long term (current) use of insulin: Secondary | ICD-10-CM | POA: Diagnosis not present

## 2022-09-25 DIAGNOSIS — Z7951 Long term (current) use of inhaled steroids: Secondary | ICD-10-CM | POA: Insufficient documentation

## 2022-09-25 DIAGNOSIS — J45909 Unspecified asthma, uncomplicated: Secondary | ICD-10-CM | POA: Diagnosis not present

## 2022-09-25 DIAGNOSIS — I10 Essential (primary) hypertension: Secondary | ICD-10-CM | POA: Diagnosis not present

## 2022-09-25 DIAGNOSIS — E119 Type 2 diabetes mellitus without complications: Secondary | ICD-10-CM | POA: Insufficient documentation

## 2022-09-25 DIAGNOSIS — R519 Headache, unspecified: Secondary | ICD-10-CM | POA: Diagnosis present

## 2022-09-25 LAB — CBC
HCT: 43.5 % (ref 36.0–46.0)
Hemoglobin: 14.3 g/dL (ref 12.0–15.0)
MCH: 31.9 pg (ref 26.0–34.0)
MCHC: 32.9 g/dL (ref 30.0–36.0)
MCV: 97.1 fL (ref 80.0–100.0)
Platelets: 352 10*3/uL (ref 150–400)
RBC: 4.48 MIL/uL (ref 3.87–5.11)
RDW: 11.6 % (ref 11.5–15.5)
WBC: 5.3 10*3/uL (ref 4.0–10.5)
nRBC: 0 % (ref 0.0–0.2)

## 2022-09-25 LAB — BASIC METABOLIC PANEL
Anion gap: 14 (ref 5–15)
BUN: 8 mg/dL (ref 6–20)
CO2: 25 mmol/L (ref 22–32)
Calcium: 9 mg/dL (ref 8.9–10.3)
Chloride: 103 mmol/L (ref 98–111)
Creatinine, Ser: 0.88 mg/dL (ref 0.44–1.00)
GFR, Estimated: >60 mL/min (ref >60)
Glucose, Bld: 84 mg/dL (ref 70–99)
Potassium: 3.6 mmol/L (ref 3.5–5.1)
Sodium: 142 mmol/L (ref 135–145)

## 2022-09-25 LAB — TROPONIN I (HIGH SENSITIVITY): Troponin I (High Sensitivity): 4 ng/L (ref <18)

## 2022-09-25 LAB — HCG, SERUM, QUALITATIVE: Preg, Serum: NEGATIVE

## 2022-09-25 MED ORDER — ACETAMINOPHEN 500 MG PO TABS
1000.0000 mg | ORAL_TABLET | Freq: Once | ORAL | Status: AC
  Administered 2022-09-25: 1000 mg via ORAL
  Filled 2022-09-25: qty 2

## 2022-09-25 MED ORDER — HYDRALAZINE HCL 25 MG PO TABS
25.0000 mg | ORAL_TABLET | Freq: Once | ORAL | Status: AC
  Administered 2022-09-25: 25 mg via ORAL
  Filled 2022-09-25: qty 1

## 2022-09-25 NOTE — Discharge Instructions (Addendum)
Please take your blood pressure medications as prescribed. Take tylenol/ibuprofen for pain. I recommend close follow-up with PCP for reevaluation.  Please do not hesitate to return to emergency department if worrisome signs symptoms we discussed become apparent.

## 2022-09-25 NOTE — ED Triage Notes (Signed)
Pt has been having elevated blood pressure since yesterday while seeing her neurologist, states it was 170/100s at the offivce she followed up with her pcp who told her to take her bp meds which have minimally brought it down but she woke up today with a small headache that has since dissipated. There is still some head fullness and tightness. She has no unilateral weakness, and she still have numbness present from two previous strokes she has had. She appears with a lot of anxiety and fear of another stroke.

## 2022-09-25 NOTE — ED Provider Notes (Signed)
EMERGENCY DEPARTMENT AT [facility] Provider Note   CSN: [id number] Arrival date & time: 09/25/22  1355     History  Chief Complaint  Patient presents with   Hypertension    Christina [name] is a 42 y.o. female history of asthma, diabetes, hypertension, CVA x 2 presents today for evaluation of hypertension and headache.  Symptoms started today.  States that she has had headache, left-sided, along with floaters in her visions and nausea.  She reports that she has been taking her high blood pressure medication but has been out of amlodipine and metoprolol for 2 days.  She denies any weakness, numbness or difficulty moving or speaking.  She is concerned for another stroke.  States her primary care has sent her amlodipine and metoprolol to the pharmacy but she has not been able to pick them up.   Hypertension    Past Medical History:  Diagnosis Date   Asthma    Diabetes mellitus without complication (HCC)    GERD (gastroesophageal reflux disease)    Hypertension    Sleep apnea    Stroke (HCC) 03/29/2020   some left sided weakness   Past Surgical History:  Procedure Laterality Date   BREAST REDUCTION SURGERY Bilateral 12/06/2021   Procedure: MAMMARY REDUCTION  (BREAST);  Surgeon: Glenna Fellows, MD;  Location: [facility];  Service: Plastics;  Laterality: Bilateral;   BUBBLE STUDY  05/29/2020   Procedure: BUBBLE STUDY;  Surgeon: Little Ishikawa, MD;  Location: [facility] ENDOSCOPY;  Service: Cardiovascular;;   IR CT HEAD LTD  05/27/2020   IR PERCUTANEOUS ART THROMBECTOMY/INFUSION INTRACRANIAL INC DIAG ANGIO  05/27/2020   RADIOLOGY WITH ANESTHESIA N/A 05/27/2020   Procedure: IR WITH ANESTHESIA;  Surgeon: Radiologist, Medication, MD;  Location: [facility] OR;  Service: Radiology;  Laterality: N/A;   TEE WITHOUT CARDIOVERSION N/A 05/29/2020   Procedure: TRANSESOPHAGEAL ECHOCARDIOGRAM (TEE);  Surgeon: Little Ishikawa, MD;  Location: [facility] ENDOSCOPY;   Service: Cardiovascular;  Laterality: N/A;   TUBAL LIGATION       Home Medications Prior to Admission medications   Medication Sig Start Date End Date Taking? Authorizing Provider  albuterol (PROVENTIL HFA;VENTOLIN HFA) 108 (90 BASE) MCG/ACT inhaler Inhale 2 puffs into the lungs every 6 (six) hours as needed for wheezing.    [provider]  amLODipine (NORVASC) 10 MG tablet Take 10 mg by mouth daily. 06/07/21   [provider]  clopidogrel (PLAVIX) 75 MG tablet TAKE 1 TABLET BY MOUTH EVERY DAY 03/17/22   Micki Riley, MD  ergocalciferol (VITAMIN D2) 1.25 MG (50000 UT) capsule Take 50,000 Units by mouth every Monday.    [provider]  loratadine (CLARITIN) 10 MG tablet Take 10 mg by mouth daily.    [provider]  metoprolol tartrate (LOPRESSOR) 100 MG tablet Take 100 mg by mouth daily. 08/11/19   [provider]  ondansetron (ZOFRAN ODT) 4 MG disintegrating tablet Take 1 tablet (4 mg total) by mouth every 8 (eight) hours as needed for nausea or vomiting. 09/16/19   Alvira Monday, MD  OZEMPIC, 2 MG/DOSE, 8 MG/3ML SOPN SMARTSIG:0.75 Milliliter(s) SUB-Q Once a Week 07/08/21   [provider]  prednisoLONE acetate (PRED FORTE) 1 % ophthalmic suspension Place 1 drop into both eyes as needed. 08/14/21   [provider]  predniSONE (DELTASONE) 50 MG tablet Take 1 tablet (50 mg total) by mouth daily with breakfast. 04/18/21   Jacalyn Lefevre, MD  rosuvastatin (CRESTOR) 20 MG tablet Take 1  tablet (20 mg total) by mouth daily. 03/26/21 12/02/21  Dellis Filbert, MD  sertraline (ZOLOFT) 100 MG tablet Take by mouth. 02/06/22   [provider]      Allergies    Peanut-containing drug products, Lisinopril, Penicillins, and Tomato    Review of Systems   Review of Systems Negative except as per HPI.  Physical Exam Updated Vital Signs BP (!) 179/118   Pulse 83   Temp 98.4 F (36.9 C) (Oral)   Resp 16   SpO2 100%  Physical  Exam Vitals and nursing note reviewed.  Constitutional:      Appearance: Normal appearance.  HENT:     Head: Normocephalic and atraumatic.     Mouth/Throat:     Mouth: Mucous membranes are moist.  Eyes:     General: No scleral icterus. Cardiovascular:     Rate and Rhythm: Normal rate and regular rhythm.     Pulses: Normal pulses.     Heart sounds: Normal heart sounds.  Pulmonary:     Effort: Pulmonary effort is normal.     Breath sounds: Normal breath sounds.  Abdominal:     General: Abdomen is flat.     Palpations: Abdomen is soft.     Tenderness: There is no abdominal tenderness.  Musculoskeletal:        General: No deformity.  Skin:    General: Skin is warm.     Findings: No rash.  Neurological:     General: No focal deficit present.     Mental Status: She is alert.     Comments: Cranial nerves II through XII intact. Intact sensation to light touch in all 4 extremities. 5/5 strength in all 4 extremities. Intact finger-to-nose and heel-to-shin of all 4 extremities. No visual field cuts. No neglect noted. No aphasia noted.   Psychiatric:        Mood and Affect: Mood normal.     ED Results / Procedures / Treatments   Labs (all labs ordered are listed, but only abnormal results are displayed) Labs Reviewed  BASIC METABOLIC PANEL  CBC  HCG, SERUM, QUALITATIVE  TROPONIN I (HIGH SENSITIVITY)  TROPONIN I (HIGH SENSITIVITY)    EKG None  Radiology No results found.  Procedures Procedures    Medications Ordered in ED Medications  hydrALAZINE (APRESOLINE) tablet 25 mg (has no administration in time range)    ED Course/ Medical Decision Making/ A&P                                 Medical Decision Making Amount and/or Complexity of Data Reviewed Labs: ordered. Radiology: ordered.  Risk OTC drugs. Prescription drug management.   This patient presents to the ED for hypertension, this involves an extensive number of treatment options, and is a complaint  that carries with a high risk of complications and morbidity.  The differential diagnosis includes hypertensive urgency, hypertensive emergency.  This is not an exhaustive list.  Lab tests: I ordered and personally interpreted labs.  The pertinent results include: WBC unremarkable. Hbg unremarkable. Platelets unremarkable. Electrolytes unremarkable. BUN, creatinine unremarkable.  Troponin is negative.  hCG negative.  Imaging studies: I ordered imaging studies, personally reviewed, interpreted imaging and agree with the radiologist's interpretations. The results include: CT head is unremarkable.  Problem list/ ED course/ Critical interventions/ Medical management: HPI: See above Vital signs within normal range and stable throughout visit. Laboratory/imaging studies significant for: See above. On physical  examination, patient is afebrile and appears in no acute distress.  She complains of headache that started today.  There was no focal neurological deficit on my examination. Patient is otherwise asymptomatic without confusion, chest pain, dysuria, vision changes, focal neurological deficit or SOB. Patient is hypertensive here. Patient has not been taking all her HTN medications.  States her PCP just refilled her 2 other medications for hypertension and she has not picked them up yet.  Doubt hypertenstive emergency, patient with no signs of AMS, pulmonary edema, heart failure, ACS, PRESS syndrome, intracranial hemorrhage, renal infarction or failure or other end organ damage. Plan to discharge patient home with PMD follow up. I have reviewed the patient home medicines and have made adjustments as needed.  Cardiac monitoring/EKG: The patient was maintained on a cardiac monitor.  I personally reviewed and interpreted the cardiac monitor which showed an underlying rhythm of: sinus rhythm.  Additional history obtained: External records from outside source obtained and reviewed including: Chart review  including previous notes, labs, imaging.  Consultations obtained:  Disposition Continued outpatient therapy. Follow-up with PCP recommended for reevaluation of symptoms. Treatment plan discussed with patient.  Pt acknowledged understanding was agreeable to the plan. Worrisome signs and symptoms were discussed with patient, and patient acknowledged understanding to return to the ED if they noticed these signs and symptoms. Patient was stable upon discharge.   This chart was dictated using voice recognition software.  Despite best efforts to proofread,  errors can occur which can change the documentation meaning.          Final Clinical Impression(s) / ED Diagnoses Final diagnoses:  Hypertension, unspecified type    Rx / DC Orders ED Discharge Orders     None         Jeanelle Malling, [place] 09/25/22 Fay Records, [place] 10/01/22 [phone]

## 2022-09-25 NOTE — ED Notes (Signed)
Acuity 2 due to the blood pressure

## 2022-11-04 ENCOUNTER — Telehealth: Payer: Self-pay | Admitting: Neurology

## 2022-11-04 DIAGNOSIS — G4733 Obstructive sleep apnea (adult) (pediatric): Secondary | ICD-10-CM

## 2022-11-04 NOTE — Telephone Encounter (Signed)
Pt said, dog chewed up hose in August, contacted [facility] to replace the hose. They send form signed neurologist. Spoke with [facility] today and they have not received it. Would like a call back.

## 2022-11-04 NOTE — Telephone Encounter (Signed)
Spoke to pt DME needing a new order from Dr Frances Furbish fro new supplies Informed pt will send request to Dr Frances Furbish today Pt thanked me for calling

## 2022-11-05 NOTE — Telephone Encounter (Signed)
Zott, Hennie Duos, RN; Melvern Sample Got It     Previous Messages    ----- Message ----- From: Guy Begin, RN Sent: 11/05/2022   7:22 AM EDT To: Melvern Sample; Stacy Zott Subject: needs new tubing as dog chewed old one        Good Morning,  Needs new tubing, order placed.  Rico Ala Female, 42 y.o., Jan 02, 1981 Pronouns: she/her/hers MRN: [id number] Phone: [phone] Judie Petit)   Delmer Islam

## 2022-11-05 NOTE — Telephone Encounter (Signed)
Order signed.  Community message sent to [facility] for replacement.

## 2023-02-01 ENCOUNTER — Other Ambulatory Visit: Payer: Self-pay | Admitting: Neurology

## 2023-04-12 ENCOUNTER — Encounter (HOSPITAL_COMMUNITY): Payer: Self-pay

## 2023-04-12 ENCOUNTER — Emergency Department (HOSPITAL_COMMUNITY)
Admission: EM | Admit: 2023-04-12 | Discharge: 2023-04-12 | Discharge status: Against Medical Advice | Attending: Emergency Medicine | Admitting: Emergency Medicine

## 2023-04-12 ENCOUNTER — Other Ambulatory Visit: Payer: Self-pay

## 2023-04-12 DIAGNOSIS — Z5321 Procedure and treatment not carried out due to patient leaving prior to being seen by health care provider: Secondary | ICD-10-CM | POA: Insufficient documentation

## 2023-04-12 DIAGNOSIS — R112 Nausea with vomiting, unspecified: Secondary | ICD-10-CM | POA: Insufficient documentation

## 2023-04-12 LAB — COMPREHENSIVE METABOLIC PANEL
ALT: 29 U/L (ref 0–44)
AST: 30 U/L (ref 15–41)
Albumin: 4.2 g/dL (ref 3.5–5.0)
Alkaline Phosphatase: 39 U/L (ref 38–126)
Anion gap: 14 (ref 5–15)
BUN: 10 mg/dL (ref 6–20)
CO2: 18 mmol/L — ABNORMAL LOW (ref 22–32)
Calcium: 9.1 mg/dL (ref 8.9–10.3)
Chloride: 104 mmol/L (ref 98–111)
Creatinine, Ser: 0.79 mg/dL (ref 0.44–1.00)
GFR, Estimated: >60 mL/min (ref >60)
Glucose, Bld: 144 mg/dL — ABNORMAL HIGH (ref 70–99)
Potassium: 3.4 mmol/L — ABNORMAL LOW (ref 3.5–5.1)
Sodium: 136 mmol/L (ref 135–145)
Total Bilirubin: 0.7 mg/dL (ref 0.0–1.2)
Total Protein: 7.9 g/dL (ref 6.5–8.1)

## 2023-04-12 LAB — URINALYSIS, ROUTINE W REFLEX MICROSCOPIC
Bilirubin Urine: NEGATIVE
Glucose, UA: NEGATIVE mg/dL
Hgb urine dipstick: NEGATIVE
Ketones, ur: NEGATIVE mg/dL
Leukocytes,Ua: NEGATIVE
Nitrite: NEGATIVE
Protein, ur: 100 mg/dL — AB
Specific Gravity, Urine: 1.015 (ref 1.005–1.030)
pH: 8 (ref 5.0–8.0)

## 2023-04-12 LAB — CBC
HCT: 48.3 % — ABNORMAL HIGH (ref 36.0–46.0)
Hemoglobin: 15.6 g/dL — ABNORMAL HIGH (ref 12.0–15.0)
MCH: 32.6 pg (ref 26.0–34.0)
MCHC: 32.3 g/dL (ref 30.0–36.0)
MCV: 101 fL — ABNORMAL HIGH (ref 80.0–100.0)
Platelets: 329 10*3/uL (ref 150–400)
RBC: 4.78 MIL/uL (ref 3.87–5.11)
RDW: 11.4 % — ABNORMAL LOW (ref 11.5–15.5)
WBC: 8.2 10*3/uL (ref 4.0–10.5)
nRBC: 0 % (ref 0.0–0.2)

## 2023-04-12 LAB — LIPASE, BLOOD: Lipase: 27 U/L (ref 11–51)

## 2023-04-12 MED ORDER — ONDANSETRON 4 MG PO TBDP
4.0000 mg | ORAL_TABLET | Freq: Once | ORAL | Status: AC | PRN
  Administered 2023-04-12: 4 mg via ORAL
  Filled 2023-04-12: qty 1

## 2023-04-12 NOTE — ED Triage Notes (Signed)
 Patient complains of drinking ETOH last night and awoke early am with nausea and vomiting. Alert and oriented, NAD

## 2023-04-12 NOTE — ED Notes (Signed)
 Pt called twice by [facility] staff with no response.

## 2023-05-11 ENCOUNTER — Ambulatory Visit: Payer: BC Managed Care – PPO | Attending: Cardiology | Admitting: Cardiology

## 2023-05-11 NOTE — Progress Notes (Deleted)
  Cardiology Office Note:  .   Date:  05/11/2023  ID:  Rico Ala, DOB Apr 28, 1980, MRN [id number] PCP: Noralee Chars  [facility] Providers Cardiologist:  Truett Mainland, MD PCP: Clearnce Hasten, PA-C  No chief complaint on file.    Dedee Liss is a 43 y.o. female with *** Discussed the use of AI scribe software for clinical note transcription with the patient, who gave verbal consent to proceed.  History of Present Illness       There were no vitals filed for this visit.    ROS      Studies Reviewed: .        *** Independently interpreted 04/2023: Chol ***, TG ***, HDL ***, LDL *** HbA1C ***% Hb *** Cr *** ***  Risk Assessment/Calculations:   {Does this patient have ATRIAL FIBRILLATION?:[phone]}    Physical Exam   VISIT DIAGNOSES: No diagnosis found.   Jrue Yambao is a 43 y.o. female with *** Assessment and Plan Assessment & Plan       {Are you ordering a CV Procedure (e.g. stress test, cath, DCCV, TEE, etc)?   Press F2        :[id number]}    No orders of the defined types were placed in this encounter.    F/u in ***  Signed, Elder Negus, MD

## 2023-06-19 ENCOUNTER — Encounter (HOSPITAL_COMMUNITY): Payer: Self-pay

## 2023-06-19 ENCOUNTER — Emergency Department (HOSPITAL_COMMUNITY)
Admission: EM | Admit: 2023-06-19 | Discharge: 2023-06-19 | Disposition: A | Discharge status: Home / Self Care | Attending: Emergency Medicine | Admitting: Emergency Medicine

## 2023-06-19 ENCOUNTER — Other Ambulatory Visit: Payer: Self-pay

## 2023-06-19 DIAGNOSIS — J45909 Unspecified asthma, uncomplicated: Secondary | ICD-10-CM | POA: Insufficient documentation

## 2023-06-19 DIAGNOSIS — Z91010 Allergy to peanuts: Secondary | ICD-10-CM | POA: Insufficient documentation

## 2023-06-19 DIAGNOSIS — R112 Nausea with vomiting, unspecified: Secondary | ICD-10-CM | POA: Diagnosis present

## 2023-06-19 DIAGNOSIS — E876 Hypokalemia: Secondary | ICD-10-CM | POA: Diagnosis not present

## 2023-06-19 DIAGNOSIS — E119 Type 2 diabetes mellitus without complications: Secondary | ICD-10-CM | POA: Insufficient documentation

## 2023-06-19 DIAGNOSIS — K529 Noninfective gastroenteritis and colitis, unspecified: Secondary | ICD-10-CM | POA: Diagnosis not present

## 2023-06-19 DIAGNOSIS — Z8673 Personal history of transient ischemic attack (TIA), and cerebral infarction without residual deficits: Secondary | ICD-10-CM | POA: Diagnosis not present

## 2023-06-19 LAB — CBC
HCT: 41 % (ref 36.0–46.0)
Hemoglobin: 13.4 g/dL (ref 12.0–15.0)
MCH: 32.6 pg (ref 26.0–34.0)
MCHC: 32.7 g/dL (ref 30.0–36.0)
MCV: 99.8 fL (ref 80.0–100.0)
Platelets: 271 10*3/uL (ref 150–400)
RBC: 4.11 MIL/uL (ref 3.87–5.11)
RDW: 11.4 % — ABNORMAL LOW (ref 11.5–15.5)
WBC: 6.7 10*3/uL (ref 4.0–10.5)
nRBC: 0 % (ref 0.0–0.2)

## 2023-06-19 LAB — LIPASE, BLOOD: Lipase: 29 U/L (ref 11–51)

## 2023-06-19 LAB — COMPREHENSIVE METABOLIC PANEL WITH GFR
ALT: 16 U/L (ref 0–44)
AST: 24 U/L (ref 15–41)
Albumin: 3.8 g/dL (ref 3.5–5.0)
Alkaline Phosphatase: 40 U/L (ref 38–126)
Anion gap: 17 — ABNORMAL HIGH (ref 5–15)
BUN: 6 mg/dL (ref 6–20)
CO2: 19 mmol/L — ABNORMAL LOW (ref 22–32)
Calcium: 9 mg/dL (ref 8.9–10.3)
Chloride: 104 mmol/L (ref 98–111)
Creatinine, Ser: 0.81 mg/dL (ref 0.44–1.00)
GFR, Estimated: >60 mL/min (ref >60)
Glucose, Bld: 167 mg/dL — ABNORMAL HIGH (ref 70–99)
Potassium: 3.2 mmol/L — ABNORMAL LOW (ref 3.5–5.1)
Sodium: 140 mmol/L (ref 135–145)
Total Bilirubin: 0.9 mg/dL (ref 0.0–1.2)
Total Protein: 7.3 g/dL (ref 6.5–8.1)

## 2023-06-19 LAB — HCG, SERUM, QUALITATIVE: Preg, Serum: NEGATIVE

## 2023-06-19 MED ORDER — DIPHENHYDRAMINE HCL 50 MG/ML IJ SOLN
25.0000 mg | Freq: Once | INTRAMUSCULAR | Status: AC
  Administered 2023-06-19: 25 mg via INTRAVENOUS
  Filled 2023-06-19: qty 1

## 2023-06-19 MED ORDER — POTASSIUM CHLORIDE CRYS ER 20 MEQ PO TBCR
40.0000 meq | EXTENDED_RELEASE_TABLET | Freq: Once | ORAL | Status: AC
  Administered 2023-06-19: 40 meq via ORAL
  Filled 2023-06-19: qty 2

## 2023-06-19 MED ORDER — ONDANSETRON 4 MG PO TBDP: 4.0000 mg | ORAL_TABLET | Freq: Four times a day (QID) | ORAL | 0 refills | Status: AC | PRN

## 2023-06-19 MED ORDER — METOCLOPRAMIDE HCL 5 MG/ML IJ SOLN
10.0000 mg | Freq: Once | INTRAMUSCULAR | Status: AC
  Administered 2023-06-19: 10 mg via INTRAVENOUS
  Filled 2023-06-19: qty 2

## 2023-06-19 MED ORDER — LACTATED RINGERS IV BOLUS
1000.0000 mL | Freq: Once | INTRAVENOUS | Status: AC
  Administered 2023-06-19: 1000 mL via INTRAVENOUS

## 2023-06-19 NOTE — Discharge Instructions (Addendum)
 Please drink plenty of fluids including electrolyte containing fluids, such as pedialyte, gatorade to help with dehydration.

## 2023-06-19 NOTE — ED Provider Triage Note (Signed)
 Emergency Medicine Provider Triage Evaluation Note  Christina [name] , a 43 y.o. female  was evaluated in triage.  Pt complains of nausea vomiting abdominal pain and diarrhea that started about 4 to 5 hours ago.  She reports numerous episodes of emesis and diarrhea.  No blood in her stool that she has noticed.  Denies history of chronic abdominal issues.  Review of Systems  Positive: Abdominal pain, nausea vomiting diarrhea, weakness Negative: Fever, blood in stool or emesis  Physical Exam  BP (!) 162/119 (BP Location: Left Arm)   Pulse 86   Resp 18   Ht 5\' 7"  (1.702 m)   Wt 92.5 kg   SpO2 100%   BMI 31.95 kg/m  Gen:   Awake, no distress   Resp:  Normal effort  MSK:   Moves extremities without difficulty  Other:  Periumbilical abd pain  Medical Decision Making  Medically screening exam initiated at 2:53 PM.  Appropriate orders placed.  Christina [name] was informed that the remainder of the evaluation will be completed by another provider, this initial triage assessment does not replace that evaluation, and the importance of remaining in the ED until their evaluation is complete.     Almond Army, MD 06/19/23 [phone]

## 2023-06-19 NOTE — ED Triage Notes (Signed)
 Pt BIB [facility] fro home d/t n/v/d since this morning. A/Ox4, 90 bpm, 180/110 Hx of HTN, 99% O2 on RA, CBG 150, 20g Lt FA, did receive 4 mg zofran  while en route to ED.

## 2023-06-19 NOTE — ED Provider Notes (Signed)
 [place] EMERGENCY DEPARTMENT AT [facility] Provider Note   CSN: [id number] Arrival date & time: 06/19/23  1421     History  Chief Complaint  Patient presents with   Nausea   Emesis   Diarrhea    Christina [name] is a 43 y.o. female with past medical history significant for previous stroke, diabetes, asthma, obesity who presents concern for nausea, vomiting, diarrhea since this morning.  Reports that nausea feeling better after Zofran , Reglan, but just feels some generalized weakness.  Symptoms are started this morning, endorses some diffuse abdominal pain.   Emesis Associated symptoms: diarrhea   Diarrhea Associated symptoms: vomiting        Home Medications Prior to Admission medications   Medication Sig Start Date End Date Taking? Authorizing Provider  ondansetron  (ZOFRAN -ODT) 4 MG disintegrating tablet Take 1 tablet (4 mg total) by mouth every 6 (six) hours as needed for nausea or vomiting. 06/19/23  Yes Diann Bangerter H, PA-C  albuterol  (PROVENTIL  HFA;VENTOLIN  HFA) 108 (90 BASE) MCG/ACT inhaler Inhale 2 puffs into the lungs every 6 (six) hours as needed for wheezing.    [provider]  amLODipine (NORVASC) 10 MG tablet Take 10 mg by mouth daily. 06/07/21   [provider]  clopidogrel  (PLAVIX ) 75 MG tablet TAKE 1 TABLET BY MOUTH EVERY DAY 02/02/23   Sethi, Pramod S, MD  ergocalciferol (VITAMIN D2) 1.25 MG (50000 UT) capsule Take 50,000 Units by mouth every Monday. Patient not taking: Reported on 09/25/2022    [provider]  hydrochlorothiazide  (MICROZIDE ) 12.5 MG capsule Take 12.5 mg by mouth daily. 09/24/22   [provider]  loratadine  (CLARITIN ) 10 MG tablet Take 10 mg by mouth daily.    [provider]  metoprolol  tartrate (LOPRESSOR ) 100 MG tablet Take 100 mg by mouth daily. 08/11/19   [provider]  OZEMPIC, 2 MG/DOSE, 8 MG/3ML SOPN SMARTSIG:0.75 Milliliter(s) SUB-Q Once a Week 07/08/21   [provider]  prednisoLONE acetate (PRED FORTE) 1 % ophthalmic suspension Place 1 drop into both eyes as needed. Patient not taking: Reported on 09/25/2022 08/14/21   [provider]  rosuvastatin  (CRESTOR ) 20 MG tablet Take 1 tablet (20 mg total) by mouth daily. Patient not taking: Reported on 09/25/2022 03/26/21 12/02/21  Ariwodo, Shirley, MD  sertraline (ZOLOFT) 100 MG tablet Take 150 mg by mouth daily. 02/06/22   [provider]      Allergies    Peanut-containing drug products, Lisinopril , Penicillins, and Tomato    Review of Systems   Review of Systems  Gastrointestinal:  Positive for diarrhea and vomiting.  All other systems reviewed and are negative.   Physical Exam Updated Vital Signs BP (!) 162/119 (BP Location: Left Arm)   Pulse 86   Temp (!) 97.4 F (36.3 C) (Rectal)   Resp 18   Ht 5\' 7"  (1.702 m)   Wt 92.5 kg   SpO2 100%   BMI 31.95 kg/m  Physical Exam Vitals and nursing note reviewed.  Constitutional:      General: She is not in acute distress.    Appearance: Normal appearance.  HENT:     Head: Normocephalic and atraumatic.  Eyes:     General:        Right eye: No discharge.        Left eye: No discharge.  Cardiovascular:     Rate and Rhythm: Normal rate and regular rhythm.     Heart sounds: No murmur heard.  No friction rub. No gallop.  Pulmonary:     Effort: Pulmonary effort is normal.     Breath sounds: Normal breath sounds.  Abdominal:     General: Bowel sounds are normal.     Palpations: Abdomen is soft.     Comments: Diffusely tender with no rebound, rigidity, guarding throughout.  No Murphy sign, negative McBurney's point tenderness.  Normal bowel sounds throughout.  Skin:    General: Skin is warm and dry.     Capillary Refill: Capillary refill takes less than 2 seconds.  Neurological:     Mental Status: She is alert and oriented to person, place, and time.  Psychiatric:        Mood and Affect: Mood normal.         Behavior: Behavior normal.     ED Results / Procedures / Treatments   Labs (all labs ordered are listed, but only abnormal results are displayed) Labs Reviewed  COMPREHENSIVE METABOLIC PANEL WITH GFR - Abnormal; Notable for the following components:      Result Value   Potassium 3.2 (*)    CO2 19 (*)    Glucose, Bld 167 (*)    Anion gap 17 (*)    All other components within normal limits  CBC - Abnormal; Notable for the following components:   RDW 11.4 (*)    All other components within normal limits  LIPASE, BLOOD  HCG, SERUM, QUALITATIVE  URINALYSIS, ROUTINE W REFLEX MICROSCOPIC    EKG None  Radiology No results found.  Procedures Procedures    Medications Ordered in ED Medications  metoCLOPramide (REGLAN) injection 10 mg (10 mg Intravenous Given 06/19/23 1526)  lactated ringers  bolus 1,000 mL (0 mLs Intravenous Stopped 06/19/23 1747)  potassium chloride  SA (KLOR-CON  M) CR tablet 40 mEq (40 mEq Oral Given 06/19/23 1614)  diphenhydrAMINE  (BENADRYL ) injection 25 mg (25 mg Intravenous Given 06/19/23 1615)    ED Course/ Medical Decision Making/ A&P                                 Medical Decision Making Amount and/or Complexity of Data Reviewed Labs: ordered.  Risk Prescription drug management.   This patient is a 43 y.o. female  who presents to the ED for concern of nausea, vomiting, diarrhea, abdominal pain.   Differential diagnoses prior to evaluation: The emergent differential diagnosis includes, but is not limited to,  The causes of generalized abdominal pain include but are not limited to AAA, mesenteric ischemia, appendicitis, diverticulitis, DKA, gastritis, gastroenteritis, AMI, nephrolithiasis, pancreatitis, peritonitis, adrenal insufficiency,lead poisoning, iron toxicity, intestinal ischemia, constipation, UTI,SBO/LBO, splenic rupture, biliary disease, IBD, IBS, PUD, or hepatitis --overall with diffuse pain, sudden onset, vomiting and diarrhea high clinical  suspicion for gastroenteritis, food poisoning, versus other. This is not an exhaustive differential.   Past Medical History / Co-morbidities / Social History: Hypertension, previous stroke, GERD, diabetes  Additional history: Chart reviewed. Pertinent results include: reviewed outpatient family med visits, previous labwork, imaging from ED visits  Physical Exam: Physical exam performed. The pertinent findings include: Diffusely tender with no rebound, rigidity, guarding throughout.  No Murphy sign, negative McBurney's point tenderness.  Normal bowel sounds throughout.  Hypertensive in ed BP 162/119  Lab Tests/Imaging studies: I personally interpreted labs/imaging and the pertinent results include: CBC unremarkable, lipase normal, CMP notable for mild hypokalemia potassium 3.2, bicarb deficit, CO2 19, anion gap of 17, think likely secondary to her nausea,  vomiting.  She did not provide a urine in the ED but denies any urinary symptoms reportedly.   Medications: I ordered medication including fluids, Benadryl , potassium, Reglan.  I have reviewed the patients home medicines and have made adjustments as needed.   Disposition: After consideration of the diagnostic results and the patients response to treatment, I feel that patient feeling better after medication, lab work is overall reassuring, suspect dehydration related to gastroenteritis, discharged with Zofran , encourage plenty of fluids, patient understands and agrees to plan.   emergency department workup does not suggest an emergent condition requiring admission or immediate intervention beyond what has been performed at this time. The plan is: as above. The patient is safe for discharge and has been instructed to return immediately for worsening symptoms, change in symptoms or any other concerns.  Final Clinical Impression(s) / ED Diagnoses Final diagnoses:  Gastroenteritis  Nausea vomiting and diarrhea    Rx / DC Orders ED  Discharge Orders          Ordered    ondansetron  (ZOFRAN -ODT) 4 MG disintegrating tablet  Every 6 hours PRN        06/19/23 1747              Thatcher Doberstein, Cosimo Diones, PA-C 06/19/23 1757    Lowery Rue, DO 06/19/23 2249

## 2023-06-19 NOTE — ED Notes (Addendum)
 Pt tolerated PO. Reports she's ready to go.

## 2023-08-12 ENCOUNTER — Ambulatory Visit: Attending: Cardiology | Admitting: Cardiology

## 2023-08-12 DIAGNOSIS — R0609 Other forms of dyspnea: Secondary | ICD-10-CM | POA: Insufficient documentation

## 2023-08-12 NOTE — Progress Notes (Deleted)
  Cardiology Office Note:  .   Date:  08/12/2023  ID:  Christina [name], DOB 02-07-1980, MRN [id number] PCP: Gladis Dannielle VEAR DEVONNA   [facility] Providers Cardiologist:  Newman Lawrence, MD PCP: Gladis Dannielle VEAR, PA-C  No chief complaint on file.    Christina [name] is a 43 y.o. female with *** Discussed the use of AI scribe software for clinical note transcription with the patient, who gave verbal consent to proceed.  History of Present Illness       There were no vitals filed for this visit.    ROS      Studies Reviewed: .        *** Labs 06/2023: Hb 13.4 Cr 0.81, K 3.2  2023: Chol 75, TG 69, HDL 27, LDL 34  Risk Assessment/Calculations:   {Does this patient have ATRIAL FIBRILLATION?:[id number]}    Physical Exam   VISIT DIAGNOSES: No diagnosis found.   Christina [name] is a 43 y.o. female with *** Assessment and Plan Assessment & Plan       {Are you ordering a CV Procedure (e.g. stress test, cath, DCCV, TEE, etc)?   Press F2        :[id number]}    No orders of the defined types were placed in this encounter.    F/u in ***  Signed, Newman JINNY Lawrence, MD

## 2023-09-11 ENCOUNTER — Ambulatory Visit: Admitting: Cardiology

## 2023-09-23 NOTE — Progress Notes (Deleted)
 [facility] [address] [place]. [place] [address] [phone]       OFFICE FOLLOW UP NOTE  Ms. Vina Brandy Date of Birth:  1980/06/17 Medical Record Number:  [id number]    Primary neurologist: Dr. Buck Reason for visit: CPAP follow-up    SUBJECTIVE:   CHIEF COMPLAINT:  No chief complaint on file.   Follow-up visit:  Prior visit:  Brief HPI:   Christina [name] is a 43 y.o. female who is currently being followed for OSA on CPAP.  She was initially diagnosed in 05/2021 after sleep study showed mild or borderline OSA, oderate in REM sleep with a total AHI of 5.6/hour, REM AHI of 17.9/hour, and O2 nadir of 82%.  AutoPap therapy initiated.  At prior visit with Dr. Buck, noted CPAP noncompliance due to difficulty tolerating nasal mask and nasal congestion.  Discussed trial of different interface for improved tolerance. ESS 14/24.   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   Sleep apnea    Stroke (HCC) 03/29/2020   some left sided weakness   Type 2 diabetes mellitus (HCC) 03/22/2021    PSH:  Past Surgical History:  Procedure Laterality Date   BREAST REDUCTION SURGERY Bilateral 12/06/2021   Procedure: MAMMARY REDUCTION  (BREAST);  Surgeon: Arelia Filippo, MD;  Location:  SURGERY CENTER;  Service: Plastics;  Laterality: Bilateral;   BUBBLE STUDY  05/29/2020   Procedure: BUBBLE STUDY;  Surgeon: Kate Lonni CROME, MD;  Location: [facility] ENDOSCOPY;  Service: Cardiovascular;;   IR CT HEAD LTD  05/27/2020   IR PERCUTANEOUS ART THROMBECTOMY/INFUSION  INTRACRANIAL INC DIAG ANGIO  05/27/2020   RADIOLOGY WITH ANESTHESIA N/A 05/27/2020   Procedure: IR WITH ANESTHESIA;  Surgeon: Radiologist, Medication, MD;  Location: [facility] OR;  Service: Radiology;  Laterality: N/A;   TEE WITHOUT CARDIOVERSION N/A 05/29/2020   Procedure: TRANSESOPHAGEAL ECHOCARDIOGRAM (TEE);  Surgeon: Kate Lonni CROME, MD;  Location: [facility] ENDOSCOPY;  Service: Cardiovascular;  Laterality: N/A;   TUBAL LIGATION      Social History:  Social History   Socioeconomic History   Marital status: Married    Spouse name: Ternell   Number of children: Not on file   Years of education: Not on file   Highest education level: Not on file  Occupational History   Not on file  Tobacco Use   Smoking status: Former    Current packs/day: 0.25    Types: Cigarettes   Smokeless tobacco: Never  Vaping Use   Vaping status: Never Used  Substance and Sexual Activity   Alcohol use: Yes    Comment: occ   Drug use: No   Sexual activity: Not on file  Other Topics Concern   Not on file  Social History Narrative   Lives with husband and 3 children   Right Handed   Drinks caffeine occasionally   Social Drivers of Health   Financial Resource Strain: Medium Risk (10/04/2019)   Received from [facility] visits prior to 04/05/2022.   Overall Financial Resource Strain (CARDIA)    Difficulty of  Paying Living Expenses: Somewhat hard  Food Insecurity: Low Risk  (03/03/2023)   Received from [facility]   Hunger Vital Sign    Within the past 12 months, you worried that your food would run out before you got money to buy more: Never true    Within the past 12 months, the food you bought just didn't last and you didn't have money to get more. : Never true  Transportation Needs: No Transportation Needs (03/03/2023)   Received from [facility]    In the past 12 months, has lack of reliable transportation kept you from medical appointments, meetings, work or  from getting things needed for daily living? : No  Physical Activity: Inactive (10/04/2019)   Received from [facility] visits prior to 04/05/2022.   Exercise Vital Sign    On average, how many days per week do you engage in moderate to strenuous exercise (like a brisk walk)?: 0 days    On average, how many minutes do you engage in exercise at this level?: 0 min  Stress: Stress Concern Present (10/04/2019)   Received from [facility] visits prior to 04/05/2022.   [employer] of Occupational Health - Occupational Stress Questionnaire    Feeling of Stress : To some extent  Social Connections: Moderately Integrated (10/04/2019)   Received from [facility] visits prior to 04/05/2022.   Social Connection and Isolation Panel    In a typical week, how many times do you talk on the phone with family, friends, or neighbors?: More than three times a week    How often do you get together with friends or relatives?: Twice a week    How often do you attend church or religious services?: 1 to 4 times per year    Do you belong to any clubs or organizations such as church groups, unions, fraternal or athletic groups, or school groups?: No    How often do you attend meetings of the clubs or organizations you belong to?: Never    Are you married, widowed, divorced, separated, never married, or living with a partner?: Married  Intimate Partner Violence: Not At Risk (10/04/2019)   Received from [facility] visits prior to 04/05/2022.   Humiliation, Afraid, Rape, and Kick questionnaire    Within the last year, have you been afraid of your partner or ex-partner?: No    Within the last year, have you been humiliated or emotionally abused in other ways by your partner or ex-partner?: No    Within the last year, have you been kicked, hit, slapped, or otherwise physically hurt by your partner or ex-partner?: No    Within the last year,  have you been raped or forced to have any kind of sexual activity by your partner or ex-partner?: No    Family History:  Family History  Problem Relation Age of Onset   Asthma Mother    Diabetes Mother    Hypertension Mother    Heart attack Mother    Kidney disease Mother    Hypertension Father    Diabetes Father    Hypotension Father    Transient ischemic attack Father    Stroke Maternal Grandmother     Medications:   Current Outpatient Medications on File Prior to Visit  Medication Sig Dispense Refill   albuterol  (PROVENTIL  HFA;VENTOLIN  HFA) 108 (90 BASE) MCG/ACT inhaler Inhale 2 puffs into the lungs every 6 (six) hours  as needed for wheezing.     amLODipine (NORVASC) 10 MG tablet Take 10 mg by mouth daily.     clopidogrel  (PLAVIX ) 75 MG tablet TAKE 1 TABLET BY MOUTH EVERY DAY 90 tablet 1   ergocalciferol (VITAMIN D2) 1.25 MG (50000 UT) capsule Take 50,000 Units by mouth every Monday. (Patient not taking: Reported on 09/25/2022)     hydrochlorothiazide  (MICROZIDE ) 12.5 MG capsule Take 12.5 mg by mouth daily.     loratadine  (CLARITIN ) 10 MG tablet Take 10 mg by mouth daily.     metoprolol  tartrate (LOPRESSOR ) 100 MG tablet Take 100 mg by mouth daily.     ondansetron  (ZOFRAN -ODT) 4 MG disintegrating tablet Take 1 tablet (4 mg total) by mouth every 6 (six) hours as needed for nausea or vomiting. 20 tablet 0   OZEMPIC, 2 MG/DOSE, 8 MG/3ML SOPN SMARTSIG:0.75 Milliliter(s) SUB-Q Once a Week     prednisoLONE acetate (PRED FORTE) 1 % ophthalmic suspension Place 1 drop into both eyes as needed. (Patient not taking: Reported on 09/25/2022)     rosuvastatin  (CRESTOR ) 20 MG tablet Take 1 tablet (20 mg total) by mouth daily. (Patient not taking: Reported on 09/25/2022) 90 tablet 0   sertraline (ZOLOFT) 100 MG tablet Take 150 mg by mouth daily.     No current facility-administered medications on file prior to visit.    Allergies:   Allergies  Allergen Reactions   Peanut-Containing Drug  Products Anaphylaxis, Shortness Of Breath and Swelling   Lisinopril  Other (See Comments)    Lip swelling   Penicillins Rash   Tomato Rash    Fresh tomatoes       OBJECTIVE:  Physical Exam  There were no vitals filed for this visit. There is no height or weight on file to calculate BMI. No results found.   General: well developed, well nourished, seated, in no evident distress Head: head normocephalic and atraumatic.   Neck: supple with no carotid or supraclavicular bruits Cardiovascular: regular rate and rhythm, no murmurs  Neurologic Exam Mental Status: Awake and fully alert. Oriented to place and time. Recent and remote memory intact. Attention span, concentration and fund of knowledge appropriate. Mood and affect appropriate.  Cranial Nerves: Pupils equal, briskly reactive to light. Extraocular movements full without nystagmus. Visual fields full to confrontation. Hearing intact. Facial sensation intact. Face, tongue, palate moves normally and symmetrically.  Motor: Normal bulk and tone. Normal strength in all tested extremity muscles Gait and Station: Arises from chair without difficulty. Stance is normal. Gait demonstrates normal stride length and balance without use of AD.         ASSESSMENT/PLAN: Christina [name] is a 43 y.o. year old female    OSA on CPAP :  Compliance report shows satisfactory usage with optimal residual AHI.   Continue current pressure settings *** Discussed continued nightly usage with ensuring greater than 4 hours nightly for optimal benefit and per insurance purposes.   Continue to follow with DME company for any needed supplies or CPAP related concerns CPAP set up ***, due for new machine ***     Follow up in *** or call earlier if needed   CC:  PCP: Gladis Dannielle DEL, PA-C    I personally spent a total of *** minutes in the care of the patient today including {Time Based Coding:210964241}.     Harlene Bogaert, AGNP-BC  [facility] [address] [place], [place] [address]  Phone [phone] Fax [phone] Note: This document was prepared  with digital dictation and possible smart phrase technology. Any transcriptional errors that result from this process are unintentional.

## 2023-09-24 ENCOUNTER — Encounter: Payer: Self-pay | Admitting: Adult Health

## 2023-09-24 ENCOUNTER — Ambulatory Visit: Payer: BC Managed Care – PPO | Admitting: Adult Health

## 2023-11-10 ENCOUNTER — Encounter: Payer: Self-pay | Admitting: *Deleted

## 2023-12-18 NOTE — Progress Notes (Signed)
 " Christina [name] presents to primary care clinic for ongoing management of Anxiety/Depression, Hypertension  Last CPE: 06/09/2022  SUBJECTIVE  Last visit: Assessment & Plan 1. Anxiety/depression: - She reports that anxiety is her primary issue, causing significant distress and requiring constant effort to manage. - She will reduce her sertraline  intake to 1 pill nightly for 2 weeks, then half a pill nightly for 2 weeks, and finally half a pill every other night for 2 weeks. Concurrently, she will start fluoxetine , 20 mg daily, taking one pill every morning for 2 weeks. If well-tolerated, the dosage will be increased to two pills in the morning thereafter.   2. Sleep apnea: - Recommend that she continue use of CPAP machine.   4. Hypertension: - Her blood pressure readings have been consistently high, posing a risk for secondary complications such as another stroke, heart attack, kidney failure, etc.  Reinforced importance of taking her medications regularly and working together to improve blood pressure control..  Recommend that she monitor her blood pressure at home and we will recheck at her next visit here and will need to adjust if blood pressure remains elevated despite taking her medic patients daily   5. Diabetes mellitus, historically well-controlled: - Her A1c level was 4.2 at the last check. - She is currently on Ozempic but has not taken it for a couple of weeks due to insurance issues requiring prior authorization. She is advised to inform us  if she does not receive her Ozempic soon. If her A1c level remains in the 4s, a reduction in the Ozempic dosage may be considered.   6.  Vitamin D deficiency. Recheck vitamin D level today.   7.  Hyperlipidemia. Continue current regimen.  Please check lipid profile and will adjust medication regimen if needed.   8.  Nicotine use Recommend cessation of all nicotine Follow-up: A follow-up visit is scheduled in 6 weeks, which can be  conducted via video consultation if necessary.         Return in about 6 weeks (around 12/18/2023) for depression/anxiety, hypertension and reschedule CPE . _____________________________________________________________________________________________ Today: Anxiety/Depression: Currently managed with Fluoxetine  20 mg every day. She has not increased to the 40 MG dose yet.  She has been taking Fluoxetine  20 MG daily x 3 weeks. No med side effects.   Hypertension Currently on Amlodipine  5 mg every day, hydrochlorothiazide  12.5 mg every day, Metoprolol  100 mg every day  Takes meds daily as rx. Does not check home BP.  Denies chest pain, SOB, DOE, orthopnea, edema. BP Readings from Last 3 Encounters:  12/18/23 121/80  11/06/23 (!) 160/105  10/25/23 (!) 162/130    New problems/Concerns:  Has not taken the Ozempic 1 mg dose yet as she was waiting on insurance to approve. This was recently approved, has been off the 2 MG dose x 2 months.  History of Present Illness The patient presents for evaluation of depression, diabetes, sleep apnea, and blood pressure management.  She has been on a regimen of fluoxetine  20 mg for 3 weeks, followed by an increase to 40 mg, which she has not yet started. She reports no adverse effects from the fluoxetine  and no exacerbation of her depressive symptoms. There is a noted decrease in crying spells, sadness, and anxiety, but she continues to struggle with motivation and fatigue. She has not been taking sertraline  and has not refilled it. She expresses a desire to seek therapy but has encountered obstacles in scheduling appointments.  She is currently awaiting insurance  approval for Ozempic 1 mg. She has observed a weight increase to 215 pounds since discontinuing Ozempic 2 mg, which had previously helped her reduce her weight to 196 pounds. She reports no episodes of dizziness or hypoglycemia. Occasional episodes of hypoglycemia were noted when she was on Ozempic  2 mg, with blood glucose levels dropping to around 60 or 61, which she managed by consuming candy. She plans to resume Ozempic 1 mg to prevent further drops in her A1c levels.  She has a known diagnosis of sleep apnea and uses a CPAP machine for treatment. She has recently resumed using the machine. She has an upcoming appointment with a neurologist in 04/2024.  Her blood pressure is well-controlled with her current medication regimen.  Sleep: Reports feeling tired and poor motivation. Uses CPAP machine for sleep apnea.   Allergies[1]  Current Medications[2]  The following portions of the patient's history were reviewed and updated as appropriate: allergies, current medications, past medical history, past social history, past family history, and problem list.   ROS   See HPI.  OBJECTIVE  BP 121/80 (BP Location: Left arm, Patient Position: Sitting)   Pulse 68   Wt 99.3 kg (219 lb)   BMI 34.30 kg/m  Wt Readings from Last 3 Encounters:  12/18/23 99.3 kg (219 lb)  11/06/23 96.2 kg (212 lb)  10/25/23 91.4 kg (201 lb 6.4 oz)    GENERAL APPEARANCE: Well appearing, No acute distress.  NECK: No lymphadenopathy, thyromegaly, or carotid bruit noted.. LUNGS: Clear to auscultation bilaterally. HEART: Regular rate and rhythm without murmur.  SKIN:  No rashes or abnormal lesions visible.  EXT: No swelling NEURO: No gross deficit   Results for orders placed or performed in visit on 11/06/23  Albumin, Random Urine   Collection Time: 11/06/23 10:23 AM  Result Value Ref Range   Albumin, Urine 48 Not Established mg/L   Creatinine, Urine 543 >=20 mg/dL   Albumin/Creatinine Ratio, Urine 9 0 - 30 mg/g creat  Comprehensive Metabolic Panel   Collection Time: 11/06/23 10:23 AM  Result Value Ref Range   Sodium 142 136 - 145 mmol/L   Potassium 3.8 3.5 - 5.1 mmol/L   Chloride 107 98 - 107 mmol/L   CO2 29 21 - 31 mmol/L   Anion Gap 6 6 - 14 mmol/L   Glucose, Random 84 70 - 99 mg/dL   Blood  Urea Nitrogen (BUN) 8 7 - 25 mg/dL   Creatinine 9.21 9.39 - 1.20 mg/dL   eGFR >09 >40 fO/fpw/8.26f7   Albumin 3.9 3.5 - 5.7 g/dL   Total Protein 6.4 6.4 - 8.9 g/dL   Bilirubin, Total 0.5 0.3 - 1.0 mg/dL   Alkaline Phosphatase (ALP) 38 34 - 104 U/L   Aspartate Aminotransferase (AST) 15 13 - 39 U/L   Alanine Aminotransferase (ALT) 12 7 - 52 U/L   Calcium  8.4 (L) 8.6 - 10.3 mg/dL   BUN/Creatinine Ratio    Hemoglobin A1C With Estimated Average Glucose   Collection Time: 11/06/23 10:23 AM  Result Value Ref Range   Hemoglobin A1c 4.9 <5.7 %   Estimated Average Glucose 94 mg/dL  Lipid Panel   Collection Time: 11/06/23 10:23 AM  Result Value Ref Range   Cholesterol, Total, Lipid Panel 102 <200 mg/dL   Triglycerides, Lipid Panel 51 <150 mg/dL   HDL Cholesterol - Lipid Panel 39 (L) >=60 mg/dL   LDL Cholesterol, Calculated 50 <100 mg/dL   Non-HDL Cholesterol 63 mg/dL  TSH With Reflex To Free T4  Collection Time: 11/06/23 10:23 AM  Result Value Ref Range   TSH 1.448 0.450 - 5.330 uIU/mL  Vitamin D, 25-Hydroxy   Collection Time: 11/06/23 10:23 AM  Result Value Ref Range   Vitamin D 25-Hydroxy 17.7 (L) 30.0 - 100.0 ng/mL  CBC with Differential   Collection Time: 11/06/23 10:23 AM  Result Value Ref Range   WBC 3.20 (L) 4.40 - 11.00 10*3/uL   RBC 3.90 (L) 4.10 - 5.10 10*6/uL   Hemoglobin 12.9 12.3 - 15.3 g/dL   Hematocrit 62.0 64.0 - 44.6 %   Mean Corpuscular Volume (MCV) 97.1 (H) 80.0 - 96.0 fL   Mean Corpuscular Hemoglobin (MCH) 33.0 27.5 - 33.2 pg   Mean Corpuscular Hemoglobin Conc (MCHC) 34.0 33.0 - 37.0 g/dL   Red Cell Distribution Width (RDW) 12.1 (L) 12.3 - 17.0 %   Platelet Count (PLT) 253 150 - 450 10*3/uL   Mean Platelet Volume (MPV) 7.6 6.8 - 10.2 fL   Neutrophils % 43 %   Lymphocytes % 41 %   Monocytes % 10 %   Eosinophils % 5 %   Basophils % 1 %   Neutrophil Absolute (Man Diff) 1.40 (L) 1.80 - 7.80 10*3/uL   Lymphocytes Absolute (Man Diff) 1.30 1.00 - 4.80 10*3/uL    Monocytes Absolute (Man Diff) 0.30 0.00 - 0.80 10*3/uL   Eosinophils Absolute (Man Diff) 0.20 0.00 - 0.50 10*3/uL   Basophils Absolute (Man Diff) 0.00 0.00 - 0.20 10*3/uL   RBC & PLT Morphology Unremarkable      ASSESSMENT/PLAN   1. Generalized anxiety disorder      2. 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 Essential (primary) hypertension       Assessment & Plan 1. Depression: - Crying spells, sadness, and anxiety have shown slight improvement. Fatigue and lack of motivation persist. - No side effects reported with fluoxetine . Improvement in depression symptoms noted. - Encouraged to schedule another therapy appointment and maintain CPAP usage to improve mood and overall health. - Increase fluoxetine  dosage to 40 mg daily. An additional refill of the 40 mg dose will be provided.  2. Diabetes mellitus: - A1c level previously recorded at 4.9. - Monitor blood sugar levels closely while on Ozempic 1 mg. Dosage may need to be reduced if hypoglycemia occurs. - Ozempic 1 mg prescribed, with a plan to increase to 2 mg after one month if tolerated.  3. Sleep apnea: - Advised to continue using CPAP machine regularly to prevent excessive daytime sleepiness and improve mood.  4. Blood pressure management: - Blood pressure readings have improved today. - Continue taking blood pressure medication daily.     Return in about 4 weeks (around 01/15/2024) for video visit, follow up depression, last appt slot any day.  Appointments which have been scheduled for you    Jan 15, 2024 3:00 PM Video Visit with Dannielle Sherrilyn Lunger, PA-C [facility]  - Primary Care [facility] [address] [place]) [address] [place] [address] [phone]  Reminder: Internet Explorer is not supported. Use Google Chrome, [employer] [place], or [place]. Call [phone] for help.         This document serves as a record  of services personally performed by Dannielle Lunger, PA-C.  It was created on their behalf by Merlynn Dolly, RMA, a trained medical scribe, and Registered medical Assistant (RMA). During the course of documenting the history, physical exam and medical  decision making, I was functioning as a stage manager. The creation of this record is the providers dictation and/or activities during the visit.  Electronically signed by Merlynn Dolly, RMA 12/18/2023 3:27 PM     This document was created using the aid of voice recognition Dragon/Dax copilot software.   I agree the documentation is accurate and complete.  Electronically signed by: Dannielle Sherrilyn Lunger, PA-C, 12/20/2023, 4:05 PM        [1] Allergies Allergen Reactions   Peanut Shortness Of Breath and Swelling   Lisinopril  Angioedema   Penicillins Rash   Tomato Rash    Fresh tomatoes  [2]  Current Outpatient Medications:    albuterol  2.5 mg /3 mL (0.083 %) nebulizer solution, Take 2.5 mg by nebulization every 6 (six) hours as needed., Disp: 100 each, Rfl: 0   albuterol  HFA (Ventolin  HFA) 90 mcg/actuation inhaler, Inhale 2 puffs every 4 (four) hours., Disp: 1 Inhaler, Rfl: 1   amLODIPine  (NORVASC ) 5 mg tablet, TAKE 1 TABLET (5 MG TOTAL) BY MOUTH DAILY., Disp: 90 tablet, Rfl: 1   clopidogreL  (PLAVIX ) 75 mg tablet, Take 1 tablet by mouth daily., Disp: , Rfl:    ergocalciferol (VITAMIN D2) 1,250 mcg (50,000 unit) capsule, Take 1 capsule (50,000 Units total) by mouth once a week., Disp: 12 capsule, Rfl: 3   FLUoxetine  (PROzac ) 20 mg capsule, Take 1 capsule (20 mg total) by mouth every morning for 30 days, THEN 2 capsules (40 mg total) every morning., Disp: 90 capsule, Rfl: 0   hydroCHLOROthiazide  (HYDRODIURIL ) 12.5 mg capsule, TAKE 1 CAPSULE BY MOUTH EVERY DAY, Disp: 90 capsule, Rfl: 1   loratadine  (CLARITIN ) 10 mg tablet, 1 tablet daily., Disp: 90 tablet, Rfl: 0   metoprolol  tartrate (LOPRESSOR ) 100 mg tablet, Take 1 tablet  (100 mg total) by mouth daily., Disp: 90 tablet, Rfl: 2   multivitamin (THERAGRAN) tab tablet, Gummy- take 2 gummy daily., Disp: 180 tablet, Rfl: 0   olopatadine (PATANOL) 0.1 % ophthalmic solution, Administer 1 drop into each eyes 2 (two) times a day as needed., Disp: 5 mL, Rfl: 0   rosuvastatin  (CRESTOR ) 20 mg tablet, TAKE 1 TABLET BY MOUTH EVERY DAY, Disp: 90 tablet, Rfl: 1   blood-glucose meter misc, Check blood sugar daily in the morning, Disp: 1 each, Rfl: 0   clopidogreL  (PLAVIX ) 75 mg tablet, Take 75 mg by mouth Once Daily., Disp: , Rfl:    FLUoxetine  (PROzac ) 40 mg capsule, TAKE 1 CAPSULE (40 MG TOTAL) BY MOUTH EVERY MORNING. AFTER COMPLETING 20 MG DOSE (Patient not taking: Reported on 12/18/2023), Disp: 30 capsule, Rfl: 0   glucose monitoring kit kit, Check sugar once daily, Disp: 1 each, Rfl: 0   lancets (Easy Touch Twist Lancets) 32 gauge misc, 1 Units by miscellaneous route Once Daily., Disp: 100 each, Rfl: 5   semaglutide (Ozempic) 1 mg/dose (4 mg/3 mL) subcutaneous pen injector, Inject 1 mg under the skin every 7 days. (Patient not taking: Reported on 12/18/2023), Disp: 3 mL, Rfl: 11  Current Facility-Administered Medications:    sodium chloride  (bolus) 0.9 % bolus 1,000 mL, 1,000 mL, intravenous, Once, Channing Macario Sero, FNP "

## 2024-01-15 NOTE — Progress Notes (Signed)
 Christina [name] presents for follow-up anxiety and depression  Location Information: Patient State (at time of visit): [place]  Patient Location (at time of visit):Home/Other Non-Medical  Provider Location: Non-Provider-Based Clinic (Clinic, non-hospital) Is provider licensed to provide clinical care in the current location/state of the patient? Yes   Consent:  Patient's identity was confirmed. Presenting condition or illness was discussed with the patient/personal representative. Current proposed treatment for presenting condition or illness was explained to patient/personal representative along with the likely benefits and any significant risks or complications associated with the provision of treatment by audio/video means. The patient/personal representative verbally authorized treatment to be provided by audio/video, which may include a limited review of patient's current health status, medication, or other treatment recommendations, patient education, and an opportunity to ask questions about condition and treatment. Verbal Consent Granted by Patient/Personal Representative:Yes   Visit Information: Modality: 2-Way Real-Time Audio/Video  Video Start Time: 3:10 pm Video Stop Time: 320 pm Video Total Time: 8 min  I have personally spent 10 minutes involved in face-to-face and non-face-to-face activities for this patient on the day of the visit.  Professional time spent includes the following activities, in addition to those noted in the documentation: Medical record review and documentation  SUBJECTIVE   Depression:  Patient is here today for a 4 weeks follow up on her depression. At last office visit here, her Fluoxetine  dosage was increased to 40 mg every day. She was also encouraged to maintain CPAP usage to improve mood and overall health.   Today patient reports she feels the medicine is helping her with the depression/anxiety.  She does feel that symptoms have  improved and she is overall, feeling better.  No medication side effects.  No suicidal or violent ideation.  Stress and anxiety have improved some as well.  She is having much less crying spells.  History is somewhat limited due to the fact that video visit is performed in her automobile with her children in the car with her.      This document serves as a record of services personally performed by Dannielle Lunger, PA-C.  It was created on their behalf by Merlynn Dolly, RMA, a trained medical scribe, and Registered medical Assistant (RMA). During the course of documenting the history, physical exam and medical decision making, I was functioning as a stage manager. The creation of this record is the providers dictation and/or activities during the visit.  Electronically signed by Merlynn Dolly, RMA 01/15/2024 2:18 PM      Allergies[1]  Current Medications[2]  The following portions of the patient's history were reviewed and updated as appropriate: allergies, current medications, past medical history, past social history, past family history, and problem list.    ROS   See HPI.   OBJECTIVE  Vital signs: not performed (telehealth)  GENERAL APPEARANCE: Well appearing, well developed, well nourished, NAD  ASSESSMENT/PLAN  1. Generalized anxiety disorder (Primary) 2. 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  She will continue fluoxetine  daily for now.  3. Sleep apnea, obstructive Sleepiness and mental clarity have improved with use of CPAP.  She will continue use of CPAP nightly.      Return in about 2 months (around 03/17/2024) for depression/anxiety, hypertension, diabetes.,  In person visit, sooner if needed   Electronically signed by Merlynn Dolly, RMA 01/15/2024 12:43 PM This document was created using the aid of voice recognition Dragon/Dax copilot software.   I agree the documentation is accurate and  complete.  Electronically signed by: Dannielle Sherrilyn Lunger, PA-C, 01/17/2024, 5:20 PM        [1] Allergies Allergen Reactions   Peanut Shortness Of Breath and Swelling   Lisinopril  Angioedema   Penicillins Rash   Tomato Rash    Fresh tomatoes  [2]  Current Outpatient Medications:    albuterol  2.5 mg /3 mL (0.083 %) nebulizer solution, Take 2.5 mg by nebulization every 6 (six) hours as needed., Disp: 100 each, Rfl: 0   albuterol  HFA (Ventolin  HFA) 90 mcg/actuation inhaler, Inhale 2 puffs every 4 (four) hours., Disp: 1 Inhaler, Rfl: 1   amLODIPine  (NORVASC ) 5 mg tablet, TAKE 1 TABLET (5 MG TOTAL) BY MOUTH DAILY., Disp: 90 tablet, Rfl: 1   blood-glucose meter misc, Check blood sugar daily in the morning, Disp: 1 each, Rfl: 0   clopidogreL  (PLAVIX ) 75 mg tablet, Take 75 mg by mouth Once Daily., Disp: , Rfl:    clopidogreL  (PLAVIX ) 75 mg tablet, Take 1 tablet by mouth daily., Disp: , Rfl:    ergocalciferol (VITAMIN D2) 1,250 mcg (50,000 unit) capsule, Take 1 capsule (50,000 Units total) by mouth once a week., Disp: 12 capsule, Rfl: 3   FLUoxetine  (PROzac ) 40 mg capsule, TAKE 1 CAPSULE BY MOUTH EVERY MORNING. AFTER COMPLETING 20MG  DOSE*INSUR ONLY COVERS 90 DAYS, Disp: 90 capsule, Rfl: 1   glucose monitoring kit kit, Check sugar once daily, Disp: 1 each, Rfl: 0   hydroCHLOROthiazide  (HYDRODIURIL ) 12.5 mg capsule, TAKE 1 CAPSULE BY MOUTH EVERY DAY, Disp: 90 capsule, Rfl: 1   lancets (Easy Touch Twist Lancets) 32 gauge misc, 1 Units by miscellaneous route Once Daily., Disp: 100 each, Rfl: 5   loratadine  (CLARITIN ) 10 mg tablet, 1 tablet daily., Disp: 90 tablet, Rfl: 0   metoprolol  tartrate (LOPRESSOR ) 100 mg tablet, Take 1 tablet (100 mg total) by mouth daily., Disp: 90 tablet, Rfl: 2   multivitamin (THERAGRAN) tab tablet, Gummy- take 2 gummy daily., Disp: 180 tablet, Rfl: 0   olopatadine (PATANOL) 0.1 % ophthalmic solution, Administer 1 drop into each eyes 2 (two) times a  day as needed., Disp: 5 mL, Rfl: 0   rosuvastatin  (CRESTOR ) 20 mg tablet, TAKE 1 TABLET BY MOUTH EVERY DAY, Disp: 90 tablet, Rfl: 1   semaglutide (Ozempic) 1 mg/dose (4 mg/3 mL) subcutaneous pen injector, Inject 1 mg under the skin every 7 days., Disp: 3 mL, Rfl: 11  Current Facility-Administered Medications:    sodium chloride  (bolus) 0.9 % bolus 1,000 mL, 1,000 mL, intravenous, Once, Channing Macario Sero, FNP

## 2024-01-26 ENCOUNTER — Emergency Department (HOSPITAL_COMMUNITY)

## 2024-01-26 ENCOUNTER — Encounter (HOSPITAL_COMMUNITY): Payer: Self-pay

## 2024-01-26 ENCOUNTER — Other Ambulatory Visit: Payer: Self-pay

## 2024-01-26 ENCOUNTER — Observation Stay (HOSPITAL_COMMUNITY)
Admission: EM | Admit: 2024-01-26 | Discharge: 2024-01-28 | Disposition: A | Discharge status: Home / Self Care | Attending: Family Medicine | Admitting: Family Medicine

## 2024-01-26 DIAGNOSIS — E119 Type 2 diabetes mellitus without complications: Secondary | ICD-10-CM | POA: Insufficient documentation

## 2024-01-26 DIAGNOSIS — R29701 NIHSS score 1: Secondary | ICD-10-CM | POA: Diagnosis not present

## 2024-01-26 DIAGNOSIS — J45909 Unspecified asthma, uncomplicated: Secondary | ICD-10-CM | POA: Insufficient documentation

## 2024-01-26 DIAGNOSIS — I10 Essential (primary) hypertension: Secondary | ICD-10-CM | POA: Insufficient documentation

## 2024-01-26 DIAGNOSIS — F32A Depression, unspecified: Secondary | ICD-10-CM | POA: Insufficient documentation

## 2024-01-26 DIAGNOSIS — I63511 Cerebral infarction due to unspecified occlusion or stenosis of right middle cerebral artery: Secondary | ICD-10-CM | POA: Diagnosis not present

## 2024-01-26 DIAGNOSIS — I7090 Unspecified atherosclerosis: Secondary | ICD-10-CM | POA: Diagnosis not present

## 2024-01-26 DIAGNOSIS — E1100 Type 2 diabetes mellitus with hyperosmolarity without nonketotic hyperglycemic-hyperosmolar coma (NKHHC): Secondary | ICD-10-CM | POA: Diagnosis not present

## 2024-01-26 DIAGNOSIS — Z91010 Allergy to peanuts: Secondary | ICD-10-CM | POA: Diagnosis not present

## 2024-01-26 DIAGNOSIS — Z7982 Long term (current) use of aspirin: Secondary | ICD-10-CM | POA: Insufficient documentation

## 2024-01-26 DIAGNOSIS — E876 Hypokalemia: Secondary | ICD-10-CM | POA: Diagnosis not present

## 2024-01-26 DIAGNOSIS — F419 Anxiety disorder, unspecified: Secondary | ICD-10-CM | POA: Diagnosis not present

## 2024-01-26 DIAGNOSIS — Z79899 Other long term (current) drug therapy: Secondary | ICD-10-CM | POA: Diagnosis not present

## 2024-01-26 DIAGNOSIS — E785 Hyperlipidemia, unspecified: Secondary | ICD-10-CM | POA: Insufficient documentation

## 2024-01-26 DIAGNOSIS — I639 Cerebral infarction, unspecified: Secondary | ICD-10-CM | POA: Diagnosis not present

## 2024-01-26 DIAGNOSIS — Z87891 Personal history of nicotine dependence: Secondary | ICD-10-CM | POA: Insufficient documentation

## 2024-01-26 DIAGNOSIS — I6389 Other cerebral infarction: Secondary | ICD-10-CM | POA: Diagnosis not present

## 2024-01-26 DIAGNOSIS — I69354 Hemiplegia and hemiparesis following cerebral infarction affecting left non-dominant side: Secondary | ICD-10-CM

## 2024-01-26 DIAGNOSIS — R202 Paresthesia of skin: Secondary | ICD-10-CM | POA: Diagnosis present

## 2024-01-26 LAB — CBG MONITORING, ED: Glucose-Capillary: 93 mg/dL (ref 70–99)

## 2024-01-26 LAB — DIFFERENTIAL
Abs Immature Granulocytes: 0.02 K/uL (ref 0.00–0.07)
Basophils Absolute: 0.1 K/uL (ref 0.0–0.1)
Basophils Relative: 1 %
Eosinophils Absolute: 0.4 K/uL (ref 0.0–0.5)
Eosinophils Relative: 7 %
Immature Granulocytes: 0 %
Lymphocytes Relative: 46 %
Lymphs Abs: 2.6 K/uL (ref 0.7–4.0)
Monocytes Absolute: 0.5 K/uL (ref 0.1–1.0)
Monocytes Relative: 8 %
Neutro Abs: 2.2 K/uL (ref 1.7–7.7)
Neutrophils Relative %: 38 %

## 2024-01-26 LAB — PROTIME-INR
INR: 1 (ref 0.8–1.2)
Prothrombin Time: 13.6 s (ref 11.4–15.2)

## 2024-01-26 LAB — COMPREHENSIVE METABOLIC PANEL WITH GFR
ALT: 28 U/L (ref 0–44)
AST: 47 U/L — ABNORMAL HIGH (ref 15–41)
Albumin: 3.9 g/dL (ref 3.5–5.0)
Alkaline Phosphatase: 46 U/L (ref 38–126)
Anion gap: 9 (ref 5–15)
BUN: 10 mg/dL (ref 6–20)
CO2: 28 mmol/L (ref 22–32)
Calcium: 9 mg/dL (ref 8.9–10.3)
Chloride: 100 mmol/L (ref 98–111)
Creatinine, Ser: 0.88 mg/dL (ref 0.44–1.00)
GFR, Estimated: >60 mL/min
Glucose, Bld: 70 mg/dL (ref 70–99)
Potassium: 4.2 mmol/L (ref 3.5–5.1)
Sodium: 138 mmol/L (ref 135–145)
Total Bilirubin: 0.5 mg/dL (ref 0.0–1.2)
Total Protein: 7 g/dL (ref 6.5–8.1)

## 2024-01-26 LAB — I-STAT CHEM 8, ED
BUN: 9 mg/dL (ref 6–20)
Calcium, Ion: 1.05 mmol/L — ABNORMAL LOW (ref 1.15–1.40)
Chloride: 100 mmol/L (ref 98–111)
Creatinine, Ser: 1 mg/dL (ref 0.44–1.00)
Glucose, Bld: 78 mg/dL (ref 70–99)
HCT: 41 % (ref 36.0–46.0)
Hemoglobin: 13.9 g/dL (ref 12.0–15.0)
Potassium: 3.9 mmol/L (ref 3.5–5.1)
Sodium: 140 mmol/L (ref 135–145)
TCO2: 30 mmol/L (ref 22–32)

## 2024-01-26 LAB — ETHANOL: Alcohol, Ethyl (B): <15 mg/dL

## 2024-01-26 LAB — CBC
HCT: 42.4 % (ref 36.0–46.0)
Hemoglobin: 14 g/dL (ref 12.0–15.0)
MCH: 32.9 pg (ref 26.0–34.0)
MCHC: 33 g/dL (ref 30.0–36.0)
MCV: 99.5 fL (ref 80.0–100.0)
Platelets: 286 K/uL (ref 150–400)
RBC: 4.26 MIL/uL (ref 3.87–5.11)
RDW: 11.1 % — ABNORMAL LOW (ref 11.5–15.5)
WBC: 5.7 K/uL (ref 4.0–10.5)
nRBC: 0 % (ref 0.0–0.2)

## 2024-01-26 LAB — APTT: aPTT: 32 s (ref 24–36)

## 2024-01-26 MED ORDER — ACETAMINOPHEN 325 MG PO TABS
650.0000 mg | ORAL_TABLET | Freq: Four times a day (QID) | ORAL | Status: DC | PRN
  Administered 2024-01-26: 650 mg via ORAL
  Filled 2024-01-26: qty 2

## 2024-01-26 MED ORDER — MAGNESIUM HYDROXIDE 400 MG/5ML PO SUSP: 30.0000 mL | Freq: Every day | ORAL | Status: DC | PRN

## 2024-01-26 MED ORDER — IOHEXOL 350 MG/ML SOLN
75.0000 mL | Freq: Once | INTRAVENOUS | Status: AC | PRN
  Administered 2024-01-26: 75 mL via INTRAVENOUS

## 2024-01-26 MED ORDER — AMLODIPINE BESYLATE 5 MG PO TABS: 10.0000 mg | ORAL_TABLET | Freq: Every day | ORAL | Status: DC

## 2024-01-26 MED ORDER — ASPIRIN 81 MG PO TBEC
81.0000 mg | DELAYED_RELEASE_TABLET | Freq: Every day | ORAL | Status: DC
  Administered 2024-01-26 – 2024-01-28 (×3): 81 mg via ORAL
  Filled 2024-01-26 (×3): qty 1

## 2024-01-26 MED ORDER — ACETAMINOPHEN 650 MG RE SUPP: 650.0000 mg | Freq: Four times a day (QID) | RECTAL | Status: DC | PRN

## 2024-01-26 MED ORDER — CLOPIDOGREL BISULFATE 75 MG PO TABS
75.0000 mg | ORAL_TABLET | Freq: Every day | ORAL | Status: DC
  Administered 2024-01-26 – 2024-01-28 (×3): 75 mg via ORAL
  Filled 2024-01-26 (×3): qty 1

## 2024-01-26 MED ORDER — LORAZEPAM 1 MG PO TABS
0.5000 mg | ORAL_TABLET | ORAL | Status: AC | PRN
  Administered 2024-01-26: 0.5 mg via ORAL
  Filled 2024-01-26: qty 1

## 2024-01-26 MED ORDER — LORATADINE 10 MG PO TABS
10.0000 mg | ORAL_TABLET | Freq: Every day | ORAL | Status: DC
  Administered 2024-01-27 – 2024-01-28 (×2): 10 mg via ORAL
  Filled 2024-01-26 (×2): qty 1

## 2024-01-26 MED ORDER — SERTRALINE HCL 50 MG PO TABS: 150.0000 mg | ORAL_TABLET | Freq: Every day | ORAL | Status: DC

## 2024-01-26 MED ORDER — METOPROLOL TARTRATE 25 MG PO TABS: 100.0000 mg | ORAL_TABLET | Freq: Every day | ORAL | Status: DC

## 2024-01-26 MED ORDER — STROKE: EARLY STAGES OF RECOVERY BOOK
Freq: Once | Status: AC
  Filled 2024-01-26: qty 1

## 2024-01-26 MED ORDER — ONDANSETRON HCL 4 MG/2ML IJ SOLN: 4.0000 mg | Freq: Four times a day (QID) | INTRAMUSCULAR | Status: DC | PRN

## 2024-01-26 MED ORDER — SODIUM CHLORIDE 0.9 % IV SOLN: INTRAVENOUS | Status: AC

## 2024-01-26 MED ORDER — SODIUM CHLORIDE 0.9% FLUSH: 3.0000 mL | Freq: Once | INTRAVENOUS | Status: DC

## 2024-01-26 MED ORDER — HYDROCHLOROTHIAZIDE 12.5 MG PO TABS: 12.5000 mg | ORAL_TABLET | Freq: Every day | ORAL | Status: DC

## 2024-01-26 MED ORDER — ENOXAPARIN SODIUM 40 MG/0.4ML IJ SOSY
40.0000 mg | PREFILLED_SYRINGE | INTRAMUSCULAR | Status: DC
  Administered 2024-01-27: 40 mg via SUBCUTANEOUS
  Filled 2024-01-26 (×2): qty 0.4

## 2024-01-26 MED ORDER — ONDANSETRON HCL 4 MG PO TABS: 4.0000 mg | ORAL_TABLET | Freq: Four times a day (QID) | ORAL | Status: DC | PRN

## 2024-01-26 MED ORDER — TRAZODONE HCL 50 MG PO TABS
25.0000 mg | ORAL_TABLET | Freq: Every evening | ORAL | Status: DC | PRN
  Administered 2024-01-28: 25 mg via ORAL
  Filled 2024-01-26: qty 1

## 2024-01-26 NOTE — ED Provider Triage Note (Signed)
 Emergency Medicine Provider Triage Evaluation Note  Christina [name] , a 43 y.o. female  was evaluated in triage.  Pt complains of numbness to the left arm and left side of her face since yesterday.  History of stroke and TIA.  Reports compliance with her home medicines. She is without any visual defect, no aphasia, and without neglect.. Neurological exam without any focal deficits.  Finger-to-nose normal.  Heel-to-shin normal.  No pronator drift.  Strength symmetrical in bilateral upper extremities.  Review of Systems  Positive: As above Negative: As above  Physical Exam  BP (!) 124/104   Pulse 72   Temp 97.8 F (36.6 C)   Resp 18   Ht 5' 7 (1.702 m)   Wt 93.9 kg   SpO2 99%   BMI 32.42 kg/m  Gen:   Awake, no distress   Resp:  Normal effort  MSK:   Moves extremities without difficulty  Other:    Medical Decision Making  Medically screening exam initiated at 12:46 PM.  Appropriate orders placed.  Aydia Zegarra was informed that the remainder of the evaluation will be completed by another provider, this initial triage assessment does not replace that evaluation, and the importance of remaining in the ED until their evaluation is complete.   Hildegard Loge, PA-C 01/26/24 1247

## 2024-01-26 NOTE — ED Triage Notes (Signed)
 Pt has been having left sided numbness (left side of face and left arm) since 15:50 yesterday.  Pt reports that she had a TIA 03/2021.  Pt is ambulatory, alert and oriented, no slurred speech.  No facial droop.

## 2024-01-26 NOTE — ED Triage Notes (Signed)
 Pt came in via POV d/t Lt sided numbness in her face & Lt Lt arm since 1800 yesterday. Does endorse Hx of stroke in 2022 & TIA in 2023. Pt states it happened when she was leaning forward & thought that the arm just went to sleep & after it lasting this long decided she would come in for eval. In triage: A/Ox4, decreased sensation to Lt side of face & Lt arm, no facial droop or extremity weakness/drift.

## 2024-01-26 NOTE — ED Provider Notes (Signed)
 Blood pressure 128/80, pulse 60, temperature 99 F (37.2 C), temperature source Oral, resp. rate 18, height 5' 7 (1.702 m), weight 93.9 kg, SpO2 100%.  Assuming care from Dr. Jerrol.  In short, Christina [name] is a 43 y.o. female with a chief complaint of Numbness .  Refer to the original H&P for additional details.  The current plan of care is to f/u on CTA and MRI with left face and arm numbness.  07:45 PM  MRI shows small acute infarct.  Plan for admit and neurology consultation.  Patient updated and in agreement with plan.  Discussed with Neurology. They will consult.    Darra Fonda MATSU, MD 01/30/24 2259

## 2024-01-26 NOTE — ED Provider Notes (Signed)
 " [place] EMERGENCY DEPARTMENT AT [facility] Provider Note   CSN: [id number] Arrival date & time: 01/26/24  1129     Patient presents with: Numbness   Christina [name] is a 43 y.o. female.   HPI   43 year old female with medical history significant for prior CVA, HTN, DM2, GERD, presenting to the emergency department with left arm numbness and numbness to the left side of her face.  She was last normal between 9 AM and 12 PM yesterday.  Around 12 PM she was eating when she had difficulty chewing and swallowing.  She subsequently developed a sensation of numbness to the left side of her face and left arm.  She feels like her left arm is slightly weak.  She presents to the emergency department today due to concern for stroke.  She denies any visual deficits, difficulty with speaking.   Prior to Admission medications  Medication Sig Start Date End Date Taking? Authorizing Provider  albuterol  (PROVENTIL  HFA;VENTOLIN  HFA) 108 (90 BASE) MCG/ACT inhaler Inhale 2 puffs into the lungs every 6 (six) hours as needed for wheezing.    [provider]  amLODipine  (NORVASC ) 10 MG tablet Take 10 mg by mouth daily. 06/07/21   [provider]  clopidogrel  (PLAVIX ) 75 MG tablet TAKE 1 TABLET BY MOUTH EVERY DAY 02/02/23   Sethi, Pramod S, MD  ergocalciferol (VITAMIN D2) 1.25 MG (50000 UT) capsule Take 50,000 Units by mouth every Monday. Patient not taking: Reported on 09/25/2022    [provider]  hydrochlorothiazide  (MICROZIDE ) 12.5 MG capsule Take 12.5 mg by mouth daily. 09/24/22   [provider]  loratadine  (CLARITIN ) 10 MG tablet Take 10 mg by mouth daily.    [provider]  metoprolol  tartrate (LOPRESSOR ) 100 MG tablet Take 100 mg by mouth daily. 08/11/19   [provider]  ondansetron  (ZOFRAN -ODT) 4 MG disintegrating tablet Take 1 tablet (4 mg total) by mouth every 6 (six) hours as needed for nausea or vomiting. 06/19/23   Prosperi, Christian  H, PA-C  OZEMPIC, 2 MG/DOSE, 8 MG/3ML SOPN SMARTSIG:0.75 Milliliter(s) SUB-Q Once a Week 07/08/21   [provider]  prednisoLONE acetate (PRED FORTE) 1 % ophthalmic suspension Place 1 drop into both eyes as needed. Patient not taking: Reported on 09/25/2022 08/14/21   [provider]  rosuvastatin  (CRESTOR ) 20 MG tablet Take 1 tablet (20 mg total) by mouth daily. Patient not taking: Reported on 09/25/2022 03/26/21 12/02/21  Ariwodo, Shirley I, MD  sertraline  (ZOLOFT ) 100 MG tablet Take 150 mg by mouth daily. 02/06/22   [provider]    Allergies: Peanut-containing drug products, Lisinopril , Penicillins, and Tomato    Review of Systems  Neurological:  Positive for numbness.  All other systems reviewed and are negative.   Updated Vital Signs BP (!) 124/104   Pulse 72   Temp 97.8 F (36.6 C)   Resp 18   Ht 5' 7 (1.702 m)   Wt 93.9 kg   SpO2 99%   BMI 32.42 kg/m   Physical Exam Vitals and nursing note reviewed.  Constitutional:      General: She is not in acute distress.    Appearance: She is well-developed.  HENT:     Head: Normocephalic and atraumatic.  Eyes:     Conjunctiva/sclera: Conjunctivae normal.  Cardiovascular:     Rate and Rhythm: Normal rate and regular rhythm.  Pulmonary:     Effort: Pulmonary effort is normal. No respiratory distress.  Breath sounds: Normal breath sounds.  Abdominal:     Palpations: Abdomen is soft.     Tenderness: There is no abdominal tenderness.  Musculoskeletal:        General: No swelling.     Cervical back: Neck supple.  Skin:    General: Skin is warm and dry.     Capillary Refill: Capillary refill takes less than 2 seconds.  Neurological:     Mental Status: She is alert.     Comments: MENTAL STATUS EXAM:    Orientation: Alert and oriented to person, place and time.  Memory: Cooperative, follows commands well.  Language: Speech is clear and language is normal.   CRANIAL NERVES:    CN 2 (Optic):  Visual fields intact to confrontation.  CN 3,4,6 (EOM): Pupils equal and reactive to light. Full extraocular eye movement without nystagmus.  CN 5 (Trigeminal): Facial sensation is DECREASED ON THE LEFT, no weakness of masticatory muscles.  CN 7 (Facial): No facial weakness or asymmetry.  CN 8 (Auditory): Auditory acuity grossly normal.  CN 9,10 (Glossophar): The uvula is midline, the palate elevates symmetrically.  CN 11 (spinal access): Normal sternocleidomastoid and trapezius strength.  CN 12 (Hypoglossal): The tongue is midline. No atrophy or fasciculations.SABRA   MOTOR:  Muscle Strength: 5/5RUE, 5/5LUE, 5/5RLE, 5/5LLE.   COORDINATION:   No tremor.   SENSATION:   Intact to light touch all four extremities, DECREASED IN THE LEFT ARM.  GAIT: Gait not assessed   Psychiatric:        Mood and Affect: Mood normal.     (all labs ordered are listed, but only abnormal results are displayed) Labs Reviewed  CBC - Abnormal; Notable for the following components:      Result Value   RDW 11.1 (*)    All other components within normal limits  I-STAT CHEM 8, ED - Abnormal; Notable for the following components:   Calcium , Ion 1.05 (*)    All other components within normal limits  DIFFERENTIAL  PROTIME-INR  APTT  COMPREHENSIVE METABOLIC PANEL WITH GFR  ETHANOL  HCG, SERUM, QUALITATIVE  CBG MONITORING, ED    EKG: None  Radiology: No results found.   Procedures   Medications Ordered in the ED  sodium chloride  flush (NS) 0.9 % injection 3 mL (0 mLs Intravenous Hold 01/26/24 1252)                                    Medical Decision Making Amount and/or Complexity of Data Reviewed Radiology: ordered.  Risk Prescription drug management.    43 year old female with medical history significant for prior CVA, HTN, DM2, GERD, presenting to the emergency department with left arm numbness and numbness to the left side of her face.  She was last normal between 9 AM and 12 PM yesterday.   Around 12 PM she was eating when she had difficulty chewing and swallowing.  She subsequently developed a sensation of numbness to the left side of her face and left arm.  She feels like her left arm is slightly weak.  She presents to the emergency department today due to concern for stroke.  She denies any visual deficits, difficulty with speaking.   On arrival, the patient was afebrile, not tachycardic or tachypneic, BP 124/104, saturating 99% on room air.  Patient presenting with strokelike symptoms.  The patient was protecting/maintaining their own airway.  Pt outside the window for  immediate intervention, symptoms also too mild for TNK.  Pertinent lab findings include CBG 93.   CT Head: IMPRESSION:  1. No acute intracranial abnormality.  2. Unchanged small chronic MCA infarcts.  3. 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    CTA Head and Neck: Pending at signout  MRI Brain WO: Pending at signout  The patient was  not a candidate for thrombolytics. Blood pressure was  well-controlled. Antihypertensives were not required. Signout was given to Dr. Darra to follow-up results of CTA imaging and MRI brain.  Ultimate disposition pending results of diagnostic testing and reassessment.  Signout given at 1600.      Final diagnoses:  None    ED Discharge Orders     None          Jerrol Agent, MD 01/26/24 1554  "

## 2024-01-26 NOTE — Consult Note (Signed)
 NEUROLOGY CONSULT NOTE   Date of service: January 26, 2024 Patient Name: Christina [name] MRN:  [id number] DOB:  1980/04/01 Chief Complaint: stroke Requesting Provider: Darra Fonda MATSU, MD  History of Present Illness  Christina [name] is a 43 y.o. 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 She was found to have a new R frontal operculum infarct, inferior to prior R MCA stroke.  Workup with CTA head and neck with short segment occlusion versus severe stenosis of a mid right M2 vessel at a branch point, new from 03/21/2021 but corresponding to the distribution of chronic infarcts.  Reports having vague left sided symptoms including numbness, dropping things from her left hand and a L facial droop. The symptoms were noted in the afternoon on Monday. She initially attributed these to fluctuations of prior stroke symptoms but then today, husband noticed a clear L facial droop and brought her to the ED.  She does not smoke, reports DM2 is well controlled and HbA1c is down to 4s.  LKW: Monday, noon Modified rankin score: 0-Completely asymptomatic and back to baseline post- stroke IV Thrombolysis: not offered, too mild to treat EVT: not offered, no LVO  NIHSS components Score: Comment  1a Level of Conscious 0[]  1[]  2[]  3[]      1b LOC Questions 0[]  1[]  2[]       1c LOC Commands 0[]  1[]  2[]       2 Best Gaze 0[]  1[]  2[]       3 Visual 0[]  1[]  2[]  3[]      4 Facial Palsy 0[]  1[]  2[]  3[]      5a Motor Arm - left 0[]  1[]  2[]  3[]  4[]  UN[]    5b Motor Arm - Right 0[]  1[]  2[]  3[]  4[]  UN[]    6a Motor Leg - Left 0[]  1[]  2[]  3[]  4[]  UN[]    6b Motor Leg - Right 0[]  1[]  2[]  3[]  4[]  UN[]    7 Limb Ataxia 0[]  1[]  2[]  UN[]      8 Sensory 0[]  1[x]  2[]  UN[]      9 Best Language 0[]  1[]  2[]  3[]      10  Dysarthria 0[]  1[]  2[]  UN[]      11 Extinct. 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   Sleep apnea    Stroke (HCC) 03/29/2020   some left sided weakness   Type 2 diabetes mellitus (HCC) 03/22/2021    Past Surgical History:  Procedure Laterality Date   BREAST REDUCTION SURGERY Bilateral 12/06/2021   Procedure: MAMMARY REDUCTION  (BREAST);  Surgeon: Arelia Filippo, MD;  Location: [facility];  Service: Plastics;  Laterality: Bilateral;   BUBBLE STUDY  05/29/2020   Procedure: BUBBLE STUDY;  Surgeon: Kate Lonni CROME, MD;  Location: [facility] ENDOSCOPY;  Service: Cardiovascular;;   IR CT HEAD LTD  05/27/2020   IR PERCUTANEOUS ART  THROMBECTOMY/INFUSION INTRACRANIAL INC DIAG ANGIO  05/27/2020   RADIOLOGY WITH ANESTHESIA N/A 05/27/2020   Procedure: IR WITH ANESTHESIA;  Surgeon: Radiologist, Medication, MD;  Location: [facility] OR;  Service: Radiology;  Laterality: N/A;   TEE WITHOUT CARDIOVERSION N/A 05/29/2020   Procedure: TRANSESOPHAGEAL ECHOCARDIOGRAM (TEE);  Surgeon: Kate Lonni CROME, MD;  Location: [facility] ENDOSCOPY;  Service: Cardiovascular;  Laterality: N/A;   TUBAL LIGATION      Family History: Family History  Problem Relation Age of Onset   Asthma Mother    Diabetes Mother    Hypertension Mother    Heart attack Mother    Kidney disease Mother    Hypertension Father    Diabetes Father    Hypotension Father    Transient ischemic attack Father    Stroke Maternal Grandmother     Social History  reports that she has quit smoking. Her smoking use included cigarettes.  She has never used smokeless tobacco. She reports current alcohol use. She reports that she does not use drugs.  Allergies[1]  Medications  Current Medications[2]  Vitals   Vitals:   01/26/24 1239 01/26/24 1610 01/26/24 1700 01/26/24 2035  BP:  128/80 (!) 127/92 125/87  Pulse:  60 60 65  Resp:  18 17 16   Temp:  99 F (37.2 C)  98.9 F (37.2 C)  TempSrc:  Oral  Oral  SpO2:  100% 100% 100%  Weight: 93.9 kg     Height: 5' 7 (1.702 m)       Body mass index is 32.42 kg/m.   Physical Exam   General: Laying comfortably in bed; in no acute distress.  HENT: Normal oropharynx and mucosa. Normal external appearance of ears and nose.  Neck: Supple, no pain or tenderness  CV: No JVD. No peripheral edema.  Pulmonary: Symmetric Chest rise. Normal respiratory effort.  Abdomen: Soft to touch, non-tender.  Ext: No cyanosis, edema, or deformity  Skin: No rash. Normal palpation of skin.   Musculoskeletal: Normal digits and nails by inspection. No clubbing.   Neurologic Examination  Mental status/Cognition: Alert, oriented to self, place, month and year, good attention.  Speech/language: Fluent, comprehension intact, object naming intact, repetition intact.  Cranial nerves:   CN II Pupils equal and reactive to light, no VF deficits    CN III,IV,VI EOM intact, no gaze preference or deviation, no nystagmus    CN V normal sensation in V1, V2, and V3 segments bilaterally    CN VII no asymmetry, no nasolabial fold flattening    CN VIII normal hearing to speech    CN IX & X normal palatal elevation, no uvular deviation    CN XI 5/5 head turn and 5/5 shoulder shrug bilaterally    CN XII midline tongue protrusion    Motor:  Muscle bulk: normal, tone noral, pronator drift none tremor none Mvmt Root Nerve  Muscle Right Left Comments  SA C5/6 Ax Deltoid 5 5   EF C5/6 Mc Biceps 5 5   EE C6/7/8 Rad Triceps 5 5   WF C6/7 Med FCR     WE C7/8 PIN ECU     F Ab C8/T1 U ADM/FDI 5 5   HF L1/2/3  Fem Illopsoas 5 5   KE L2/3/4 Fem Quad 5 5   DF L4/5 D Peron Tib Ant 5 5   PF S1/2 Tibial Grc/Sol 5 5    Sensation:  Light touch Intact throughout   Pin prick    Temperature    Vibration  Proprioception    Coordination/Complex Motor:  - Finger to Nose intact BL - Heel to shin intact BL - Rapid alternating movement are normal - Gait: deferred.  Labs/Imaging/Neurodiagnostic studies   CBC:  Recent Labs  Lab 2024/02/21 1258 Feb 21, 2024 1304  WBC 5.7  --   NEUTROABS 2.2  --   HGB 14.0 13.9  HCT 42.4 41.0  MCV 99.5  --   PLT 286  --    Basic Metabolic Panel:  Lab Results  Component Value Date   NA 140 02-21-2024   K 3.9 February 21, 2024   CO2 28 Feb 21, 2024   GLUCOSE 78 21-Feb-2024   BUN 9 February 21, 2024   CREATININE 1.00 2024-02-21   CALCIUM  9.0 02-21-24   GFRNONAA >60 21-Feb-2024   GFRAA >60 09/16/2019   Lipid Panel:  Lab Results  Component Value Date   LDLCALC 34 03/21/2021   HgbA1c:  Lab Results  Component Value Date   HGBA1C 5.2 03/21/2021   Urine Drug Screen:     Component Value Date/Time   LABOPIA NONE DETECTED 09/19/2021 1258   COCAINSCRNUR NONE DETECTED 09/19/2021 1258   LABBENZ NONE DETECTED 09/19/2021 1258   AMPHETMU NONE DETECTED 09/19/2021 1258   THCU POSITIVE (A) 09/19/2021 1258   LABBARB NONE DETECTED 09/19/2021 1258    Alcohol Level     Component Value Date/Time   ETH <15 Feb 21, 2024 1258   INR  Lab Results  Component Value Date   INR 1.0 02-21-2024   APTT  Lab Results  Component Value Date   APTT 32 02/21/24   AED levels: No results found for: PHENYTOIN, ZONISAMIDE, LAMOTRIGINE, LEVETIRACETA  CT Head without contrast(Personally reviewed): CTH was negative for a large hypodensity concerning for a large territory infarct or hyperdensity concerning for an ICH  CT angio Head and Neck with contrast(Personally reviewed): 1. Short segment occlusion versus severe stenosis of a mid right M2 vessel at a branch point, new from  03/21/2021 but corresponding to the distribution of chronic infarcts. 2. Decreased number of more distal branch vessels in the right MCA posterior division. 3. Widely patent cervical carotid and vertebral arteries.  MRI Brain(Personally reviewed): 1. Acute infarct in the right operculum immediately inferior to a remote frontal infarct. Edema without mass effect. 2. 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  ASSESSMENT   Delissa Dreier is a 43 y.o. 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 She was found to have a new R frontal operculum infarct, inferior to prior R MCA stroke. 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 Will get full stroke workup to rule out other etiologies.  RECOMMENDATIONS  - Frequent Neuro checks per stroke unit protocol - Recommend obtaining TTE  - Recommend obtaining Lipid panel with LDL - Please start statin if LDL > 70 - Recommend HbA1c to evaluate for diabetes and how well it is controlled. - Antithrombotic - Aspirin  81mg  daily along with plavix  75mg  daily x 90days, followed by Aspirin  81mg  daily alone - Recommend DVT ppx - SBP goal - permissive hypertension first 24 h < 220/110. Held home meds.  - Recommend Telemetry monitoring for arrythmia - Recommend bedside swallow screen prior to PO intake. - Stroke education booklet -  Recommend PT/OT/SLP consult ______________________________________________________________________  Plan discussed with Dr. Darra.  Signed, Kimesha Claxton, MD [facility]     [1]  Allergies Allergen Reactions   Peanut-Containing Drug Products Anaphylaxis, Shortness Of Breath and Swelling   Lisinopril   Other (See Comments)    Lip swelling   Penicillins Rash   Tomato Rash    Fresh tomatoes   [2]  Current Facility-Administered Medications:    sodium chloride  flush (NS) 0.9 % injection 3 mL, 3 mL, Intravenous, Once, Hildegard Loge, PA-C  Current Outpatient Medications:    albuterol  (PROVENTIL  HFA;VENTOLIN  HFA) 108 (90 BASE) MCG/ACT inhaler, Inhale 2 puffs into the lungs every 6 (six) hours as needed for wheezing., Disp: , Rfl:    amLODipine  (NORVASC ) 10 MG tablet, Take 10 mg by mouth daily., Disp: , Rfl:    clopidogrel  (PLAVIX ) 75 MG tablet, TAKE 1 TABLET BY MOUTH EVERY DAY, Disp: 90 tablet, Rfl: 1   ergocalciferol (VITAMIN D2) 1.25 MG (50000 UT) capsule, Take 50,000 Units by mouth every Monday. (Patient not taking: Reported on 09/25/2022), Disp: , Rfl:    hydrochlorothiazide  (MICROZIDE ) 12.5 MG capsule, Take 12.5 mg by mouth daily., Disp: , Rfl:    loratadine  (CLARITIN ) 10 MG tablet, Take 10 mg by mouth daily., Disp: , Rfl:    metoprolol  tartrate (LOPRESSOR ) 100 MG tablet, Take 100 mg by mouth daily., Disp: , Rfl:    ondansetron  (ZOFRAN -ODT) 4 MG disintegrating tablet, Take 1 tablet (4 mg total) by mouth every 6 (six) hours as needed for nausea or vomiting., Disp: 20 tablet, Rfl: 0   OZEMPIC, 2 MG/DOSE, 8 MG/3ML SOPN, SMARTSIG:0.75 Milliliter(s) SUB-Q Once a Week, Disp: , Rfl:    prednisoLONE acetate (PRED FORTE) 1 % ophthalmic suspension, Place 1 drop into both eyes as needed. (Patient not taking: Reported on 09/25/2022), Disp: , Rfl:    rosuvastatin  (CRESTOR ) 20 MG tablet, Take 1 tablet (20 mg total) by mouth daily. (Patient not taking: Reported on 09/25/2022), Disp: 90 tablet, Rfl: 0   sertraline  (ZOLOFT ) 100 MG tablet, Take 150 mg by mouth daily., Disp: , Rfl:

## 2024-01-26 NOTE — H&P (Signed)
 "     [place]   PATIENT NAME: Christina [name]    MR#:  [id number]  DATE OF BIRTH:  January 23, 1981  DATE OF ADMISSION:  01/26/2024  PRIMARY CARE PHYSICIAN: Gladis Dannielle DEL, PA-C   Patient is coming from: Home  REQUESTING/REFERRING PHYSICIAN: Darra Chew, MD  CHIEF COMPLAINT:   Chief Complaint  Patient presents with   Numbness    HISTORY OF PRESENT ILLNESS:  Christina [name] is a 42 y.o. female with medical history significant for asthma, type 2 diabetes mellitus, hypertension and CVA, who presented to the emergency room with acute onset of left upper and lower extremity numbness and left leg weakness that started yesterday around 5 PM.  She denied any difficulty with her gait or other paresthesias or focal muscle weakness.  No dysarthria or dysphagia.  She denied any headache or dizziness or blurred vision.  No bleeding diathesis.  No tinnitus or vertigo.  No witnessed seizures.  No nausea or vomiting or abdominal pain.  No chest pain or palpitations.  No cough or wheezing or dyspnea.  No dysuria, oliguria or hematuria or flank pain.  ED Course: When she came to the ER, BP was 124/104 with otherwise normal vital signs.  Labs revealed AST of 47 with otherwise unremarkable CMP and CBC was normal.  Coag profile was normal.  Blood glucose was 70 and later 78.  Alcohol level was less than 15. EKG as reviewed by me : None. Imaging: Noncontrast head CT scan showed no acute intracranial normalities.  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 CTA of the head and neck with and without contrast revealed the following: 1. Short segment occlusion versus severe stenosis of a mid right M2 vessel at a branch point, new from 03/21/2021 but corresponding to the distribution of chronic infarcts. 2. Decreased number of more distal branch vessels in the right MCA posterior division. 3. Widely patent cervical carotid and vertebral  arteries.. Brain MRI without contrast revealed the following: 1. Acute infarct in the right operculum immediately inferior to a remote frontal infarct. Edema without mass effect. 2. 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  The patient was given 0.5 mg of p.o. Ativan .  Dr. Vanessa who was notified and saw the patient in the ER.  She will be admitted to an observation telemetry bed for further evaluation and management. 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   Sleep apnea    Stroke (HCC) 03/29/2020   some left sided weakness   Type 2 diabetes mellitus (HCC) 03/22/2021    PAST SURGICAL HISTORY:   Past Surgical History:  Procedure Laterality Date   BREAST REDUCTION SURGERY Bilateral 12/06/2021   Procedure: MAMMARY REDUCTION  (BREAST);  Surgeon: Arelia Filippo, MD;  Location: [facility];  Service: Plastics;  Laterality: Bilateral;   BUBBLE STUDY  05/29/2020   Procedure: BUBBLE STUDY;  Surgeon: Kate Lonni CROME, MD;  Location: [facility] ENDOSCOPY;  Service: Cardiovascular;;   IR CT HEAD LTD  05/27/2020   IR PERCUTANEOUS ART THROMBECTOMY/INFUSION INTRACRANIAL INC DIAG ANGIO  05/27/2020   RADIOLOGY WITH ANESTHESIA N/A 05/27/2020   Procedure: IR WITH ANESTHESIA;  Surgeon:  Radiologist, Medication, MD;  Location: [facility] OR;  Service: Radiology;  Laterality: N/A;   TEE WITHOUT CARDIOVERSION N/A 05/29/2020   Procedure: TRANSESOPHAGEAL ECHOCARDIOGRAM (TEE);  Surgeon: Kate Lonni CROME, MD;  Location: [facility] ENDOSCOPY;  Service: Cardiovascular;  Laterality: N/A;   TUBAL LIGATION      SOCIAL HISTORY:   Social History   Tobacco Use   Smoking status: Former    Current  packs/day: 0.25    Types: Cigarettes   Smokeless tobacco: Never  Substance Use Topics   Alcohol use: Yes    Comment: occ    FAMILY HISTORY:   Family History  Problem Relation Age of Onset   Asthma Mother    Diabetes Mother    Hypertension Mother    Heart attack Mother    Kidney disease Mother    Hypertension Father    Diabetes Father    Hypotension Father    Transient ischemic attack Father    Stroke Maternal Grandmother     DRUG ALLERGIES:  Allergies[1]  REVIEW OF SYSTEMS:   ROS As per history of present illness. All pertinent systems were reviewed above. Constitutional, HEENT, cardiovascular, respiratory, GI, GU, musculoskeletal, neuro, psychiatric, endocrine, integumentary and hematologic systems were reviewed and are otherwise negative/unremarkable except for positive findings mentioned above in the HPI.   MEDICATIONS AT HOME:   Prior to Admission medications  Medication Sig Start Date End Date Taking? Authorizing Provider  albuterol  (PROVENTIL  HFA;VENTOLIN  HFA) 108 (90 BASE) MCG/ACT inhaler Inhale 2 puffs into the lungs every 6 (six) hours as needed for wheezing.    [provider]  amLODipine  (NORVASC ) 10 MG tablet Take 10 mg by mouth daily. 06/07/21   [provider]  clopidogrel  (PLAVIX ) 75 MG tablet TAKE 1 TABLET BY MOUTH EVERY DAY 02/02/23   Sethi, Pramod S, MD  ergocalciferol (VITAMIN D2) 1.25 MG (50000 UT) capsule Take 50,000 Units by mouth every Monday. Patient not taking: Reported on 09/25/2022    [provider]  hydrochlorothiazide  (MICROZIDE ) 12.5 MG capsule Take 12.5 mg by mouth daily. 09/24/22   [provider]  loratadine  (CLARITIN ) 10 MG tablet Take 10 mg by mouth daily.    [provider]  metoprolol  tartrate (LOPRESSOR ) 100 MG tablet Take 100 mg by mouth daily. 08/11/19   [provider]  ondansetron  (ZOFRAN -ODT) 4 MG disintegrating tablet Take 1 tablet (4 mg total) by mouth every 6 (six) hours as  needed for nausea or vomiting. 06/19/23   Prosperi, Christian H, PA-C  OZEMPIC, 2 MG/DOSE, 8 MG/3ML SOPN SMARTSIG:0.75 Milliliter(s) SUB-Q Once a Week 07/08/21   [provider]  prednisoLONE acetate (PRED FORTE) 1 % ophthalmic suspension Place 1 drop into both eyes as needed. Patient not taking: Reported on 09/25/2022 08/14/21   [provider]  rosuvastatin  (CRESTOR ) 20 MG tablet Take 1 tablet (20 mg total) by mouth daily. Patient not taking: Reported on 09/25/2022 03/26/21 12/02/21  Ariwodo, Shirley I, MD  sertraline  (ZOLOFT ) 100 MG tablet Take 150 mg by mouth daily. 02/06/22   [provider]      VITAL SIGNS:  Blood pressure (!) 124/92, pulse 63, temperature 98 F (36.7 C), temperature source Oral, resp. rate 20, height 5' 7 (1.702 m), weight 93.9 kg, SpO2 96%.  PHYSICAL EXAMINATION:  Physical Exam  GENERAL:  43 y.o.-year-old female patient lying in the bed with no acute distress.  EYES: Pupils equal, round, reactive to light and accommodation. No scleral icterus. Extraocular muscles intact.  HEENT: Head atraumatic, normocephalic. Oropharynx and  nasopharynx clear.  NECK:  Supple, no jugular venous distention. No thyroid enlargement, no tenderness.  LUNGS: Normal breath sounds bilaterally, no wheezing, rales,rhonchi or crepitation. No use of accessory muscles of respiration.  CARDIOVASCULAR: Regular rate and rhythm, S1, S2 normal. No murmurs, rubs, or gallops.  ABDOMEN: Soft, nondistended, nontender. Bowel sounds present. No organomegaly or mass.  EXTREMITIES: No pedal edema, cyanosis, or clubbing.  NEUROLOGIC: Cranial nerves II through XII are intact. Muscle strength 5/5 in all extremities. Sensation diminished on the left lower extremity compared to the right and otherwise intact. Gait not checked.  PSYCHIATRIC: The patient is alert and oriented x 3.  Normal affect and good eye contact. SKIN: No obvious rash, lesion, or ulcer.   LABORATORY PANEL:   CBC Recent  Labs  Lab 01/26/24 1258 01/26/24 1304  WBC 5.7  --   HGB 14.0 13.9  HCT 42.4 41.0  PLT 286  --    ------------------------------------------------------------------------------------------------------------------  Chemistries  Recent Labs  Lab 01/26/24 1258 01/26/24 1304  NA 138 140  K 4.2 3.9  CL 100 100  CO2 28  --   GLUCOSE 70 78  BUN 10 9  CREATININE 0.88 1.00  CALCIUM  9.0  --   AST 47*  --   ALT 28  --   ALKPHOS 46  --   BILITOT 0.5  --    ------------------------------------------------------------------------------------------------------------------  Cardiac Enzymes No results for input(s): TROPONINI in the last 168 hours. ------------------------------------------------------------------------------------------------------------------  RADIOLOGY:  MR BRAIN WO CONTRAST Result Date: 01/26/2024 EXAM: MRI Brain Without Contrast 01/26/2024 05:56:00 PM TECHNIQUE: Multiplanar multisequence MRI of the head/brain was performed without the administration of intravenous contrast. COMPARISON: MRI Head 02/25/22 CLINICAL HISTORY: Neuro deficit, acute, stroke suspected FINDINGS: BRAIN AND VENTRICLES: Acute infarct in the right operculum immediately inferior to a remote frontal infarct. Edema without mass effect. 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 No intracranial hemorrhage. No mass. No midline shift. No hydrocephalus. Normal flow voids. ORBITS: No acute abnormality. SINUSES AND MASTOIDS: Mild sinus mucosal thickening. BONES AND SOFT TISSUES: Normal marrow signal. No acute soft tissue abnormality. IMPRESSION: 1. Acute infarct in the right operculum immediately inferior to a remote frontal infarct. Edema without mass effect. 2. 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 Electronically signed by: Gilmore Molt 01/26/2024 07:25 PM EST RP Workstation: [id number]   CT ANGIO HEAD NECK W WO CM Result Date: 01/26/2024 EXAM: CTA HEAD  AND NECK WITHOUT AND WITH 01/26/2024 03:33:42 PM TECHNIQUE: CTA of the head and neck was performed without and with the administration of intravenous contrast. Multiplanar 2D and/or 3D reformatted images are provided for review. Automated exposure control, iterative reconstruction, and/or weight based adjustment of the mA/kV was utilized to reduce the radiation dose to as low as reasonably achievable. Stenosis of the internal carotid arteries measured using NASCET criteria. COMPARISON: CTA head and neck 03/21/2021 CLINICAL HISTORY: Stroke/TIA, determine embolic source. FINDINGS: CTA NECK: AORTIC ARCH AND ARCH VESSELS: Normal variant aortic arch branching pattern with common origin of the brachiocephalic and left common carotid arteries. No dissection or arterial injury. No significant stenosis of the brachiocephalic or subclavian arteries. CERVICAL CAROTID ARTERIES: No dissection, arterial injury, or hemodynamically significant stenosis by NASCET criteria. CERVICAL VERTEBRAL ARTERIES: No dissection, arterial injury, or significant stenosis. LUNGS AND MEDIASTINUM: Unremarkable. SOFT TISSUES: No acute abnormality. BONES: No acute abnormality. CTA HEAD: ANTERIOR CIRCULATION: The intracranial internal carotid arteries are widely patent. Both MCAs are patent proximally without evidence of a significant M1 stenosis. There is short  segment occlusion versus severe stenosis of a right mid M2 branch vessel with reconstitution of more normal flow distally (series 12 image 18), and this is new from the prior CTA but corresponds to the distribution of chronic infarcts on today's earlier noncontrast head CT. There is also a decreased number of more distal branch vessels in the right MCA posterior division. The ACAs are patent without evidence of a high grade proximal stenosis. No aneurysm. POSTERIOR CIRCULATION: The intracranial vertebral arteries are widely patent to the basilar artery. The basilar artery is widely patent. There  are small posterior communicating arteries, left larger than right. Both PCAs are patent without evidence of a high grade proximal stenosis. No aneurysm. OTHER: No dural venous sinus thrombosis on this non-dedicated study. IMPRESSION: 1. Short segment occlusion versus severe stenosis of a mid right M2 vessel at a branch point, new from 03/21/2021 but corresponding to the distribution of chronic infarcts. 2. Decreased number of more distal branch vessels in the right MCA posterior division. 3. Widely patent cervical carotid and vertebral arteries. Electronically signed by: Dasie Hamburg MD 01/26/2024 04:29 PM EST RP Workstation: [id number]   CT HEAD WO CONTRAST Result Date: 01/26/2024 EXAM: CT HEAD WITHOUT CONTRAST 01/26/2024 02:04:00 PM TECHNIQUE: CT of the head was performed without the administration of intravenous contrast. Automated exposure control, iterative reconstruction, and/or weight based adjustment of the mA/kV was utilized to reduce the radiation dose to as low as reasonably achievable. COMPARISON: Head CT 09/25/2022 and MRI 02/25/2022. CLINICAL HISTORY: Transient ischemic attack (TIA). FINDINGS: BRAIN AND VENTRICLES: There is no evidence of an acute infarct, intracranial hemorrhage, mass, midline shift, hydrocephalus, or extra-axial fluid collection. Small chronic infarcts involving the right frontal operculum and right insula are unchanged. A partially empty sella is noted. 5 to 6 mm of cerebellar tonsillar ectopia is similar to the prior studies. ORBITS: No acute abnormality. SINUSES: Mild left greater than right ethmoid air cell opacification. Bilateral maxillary sinus mucous retention cysts. Clear mastoid air cells. SOFT TISSUES AND SKULL: No acute soft tissue abnormality. No skull fracture. IMPRESSION: 1. No acute intracranial abnormality. 2. Unchanged small chronic MCA infarcts. 3. 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 Electronically signed by: Dasie Hamburg MD  01/26/2024 03:24 PM EST RP Workstation: [id number]      IMPRESSION AND PLAN:  Assessment and Plan: * Acute CVA (cerebrovascular accident) (HCC) - Brain MRI without contrast revealed the following: 1. Acute infarct in the right operculum immediately inferior to a remote frontal infarct. Edema without mass effect. 2. 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 - The patient will be admitted to an observation medically monitored bed.   - We will follow neuro checks q.4 hours for 24 hours.   - The patient will be placed on aspirin . - Permissive hypertension will be allowed. - Will obtain a 2D echo with bubble study .   - A neurology consult was obtained.Physical/occupation/speech therapy consults will be obtained in a.m.SABRA   - The patient will be placed on statin therapy and fasting lipids will be checked.    Dyslipidemia - Will continue statin therapy.  Essential hypertension - Will allow permissive hypertension. - All BP medications were held off and Lopressor  was significantly decreased  Type 2 diabetes mellitus (HCC) - The patient will be placed on supplemental coverage with NovoLog .  Depression - Will continue Zoloft .   DVT prophylaxis: Lovenox .  Advanced Care Planning:  Code Status: full code.  Family Communication:  The plan of care was discussed  in details with the patient (and family). I answered all questions. The patient agreed to proceed with the above mentioned plan. Further management will depend upon hospital course. Disposition Plan: Back to previous home environment Consults called: Neurology. All the records are reviewed and case discussed with ED provider.  Status is: Observation  I certify that at the time of admission, it is my clinical judgment that the patient will require inpatient hospital care extending more than 2 midnights.                            Dispo: The patient is from: Home              Anticipated d/c is to:  Home              Patient currently is not medically stable to d/c.              Difficult to place patient: No  Madison DELENA Peaches M.D on 01/27/2024 at 4:27 AM  [facility]   From 7 PM-7 AM, contact night-coverage [email or web address]  CC: Primary care physician; Gladis Dannielle DEL, PA-C     [1]  Allergies Allergen Reactions   Peanut-Containing Drug Products Anaphylaxis, Shortness Of Breath and Swelling   Lisinopril  Other (See Comments)    Lip swelling   Penicillins Rash   Tomato Rash    Fresh tomatoes    "

## 2024-01-27 ENCOUNTER — Observation Stay (HOSPITAL_COMMUNITY)

## 2024-01-27 ENCOUNTER — Telehealth: Payer: Self-pay | Admitting: Cardiology

## 2024-01-27 DIAGNOSIS — I639 Cerebral infarction, unspecified: Secondary | ICD-10-CM | POA: Diagnosis not present

## 2024-01-27 DIAGNOSIS — I10 Essential (primary) hypertension: Secondary | ICD-10-CM

## 2024-01-27 DIAGNOSIS — I69354 Hemiplegia and hemiparesis following cerebral infarction affecting left non-dominant side: Secondary | ICD-10-CM | POA: Diagnosis not present

## 2024-01-27 DIAGNOSIS — I6389 Other cerebral infarction: Secondary | ICD-10-CM | POA: Diagnosis not present

## 2024-01-27 DIAGNOSIS — F32A Depression, unspecified: Secondary | ICD-10-CM | POA: Insufficient documentation

## 2024-01-27 DIAGNOSIS — I6350 Cerebral infarction due to unspecified occlusion or stenosis of unspecified cerebral artery: Secondary | ICD-10-CM | POA: Diagnosis not present

## 2024-01-27 DIAGNOSIS — I7090 Unspecified atherosclerosis: Secondary | ICD-10-CM | POA: Diagnosis not present

## 2024-01-27 DIAGNOSIS — E785 Hyperlipidemia, unspecified: Secondary | ICD-10-CM

## 2024-01-27 LAB — ECHOCARDIOGRAM COMPLETE
AR max vel: 2.77 cm2
AV Area VTI: 2.65 cm2
AV Area mean vel: 2.66 cm2
AV Mean grad: 3 mmHg
AV Peak grad: 4.8 mmHg
Ao pk vel: 1.1 m/s
Area-P 1/2: 3.17 cm2
Height: 67 in
S' Lateral: 3.5 cm
Weight: 3312 [oz_av]

## 2024-01-27 LAB — GLUCOSE, CAPILLARY
Glucose-Capillary: 118 mg/dL — ABNORMAL HIGH (ref 70–99)
Glucose-Capillary: 96 mg/dL (ref 70–99)

## 2024-01-27 LAB — CBC
HCT: 43 % (ref 36.0–46.0)
Hemoglobin: 14.7 g/dL (ref 12.0–15.0)
MCH: 32.7 pg (ref 26.0–34.0)
MCHC: 34.2 g/dL (ref 30.0–36.0)
MCV: 95.6 fL (ref 80.0–100.0)
Platelets: 258 K/uL (ref 150–400)
RBC: 4.5 MIL/uL (ref 3.87–5.11)
RDW: 11.1 % — ABNORMAL LOW (ref 11.5–15.5)
WBC: 7.4 K/uL (ref 4.0–10.5)
nRBC: 0.3 % — ABNORMAL HIGH (ref 0.0–0.2)

## 2024-01-27 LAB — BASIC METABOLIC PANEL WITH GFR
Anion gap: 9 (ref 5–15)
BUN: 8 mg/dL (ref 6–20)
CO2: 27 mmol/L (ref 22–32)
Calcium: 9.1 mg/dL (ref 8.9–10.3)
Chloride: 101 mmol/L (ref 98–111)
Creatinine, Ser: 0.94 mg/dL (ref 0.44–1.00)
GFR, Estimated: >60 mL/min
Glucose, Bld: 88 mg/dL (ref 70–99)
Potassium: 3.4 mmol/L — ABNORMAL LOW (ref 3.5–5.1)
Sodium: 138 mmol/L (ref 135–145)

## 2024-01-27 LAB — LIPID PANEL
Cholesterol: 100 mg/dL (ref 0–200)
HDL: 46 mg/dL
LDL Cholesterol: 43 mg/dL (ref 0–99)
Total CHOL/HDL Ratio: 2.2 ratio
Triglycerides: 52 mg/dL
VLDL: 10 mg/dL (ref 0–40)

## 2024-01-27 LAB — HEMOGLOBIN A1C
Hgb A1c MFr Bld: 5.3 % (ref 4.8–5.6)
Mean Plasma Glucose: 105.41 mg/dL

## 2024-01-27 LAB — HIV ANTIBODY (ROUTINE TESTING W REFLEX): HIV Screen 4th Generation wRfx: NONREACTIVE

## 2024-01-27 LAB — ANTITHROMBIN III: AntiThromb III Func: 106 % (ref 75–120)

## 2024-01-27 LAB — HCG, QUANTITATIVE, PREGNANCY: hCG, Beta Chain, Quant, S: 5 m[IU]/mL — ABNORMAL HIGH

## 2024-01-27 LAB — CBG MONITORING, ED: Glucose-Capillary: 92 mg/dL (ref 70–99)

## 2024-01-27 MED ORDER — ROSUVASTATIN CALCIUM 20 MG PO TABS
20.0000 mg | ORAL_TABLET | Freq: Every day | ORAL | Status: DC
  Administered 2024-01-27 – 2024-01-28 (×2): 20 mg via ORAL
  Filled 2024-01-27 (×2): qty 1

## 2024-01-27 MED ORDER — FLUOXETINE HCL 20 MG PO CAPS
40.0000 mg | ORAL_CAPSULE | Freq: Every day | ORAL | Status: DC
  Administered 2024-01-27 – 2024-01-28 (×2): 40 mg via ORAL
  Filled 2024-01-27 (×2): qty 2

## 2024-01-27 MED ORDER — POTASSIUM CHLORIDE CRYS ER 20 MEQ PO TBCR
40.0000 meq | EXTENDED_RELEASE_TABLET | Freq: Once | ORAL | Status: AC
  Administered 2024-01-27: 40 meq via ORAL
  Filled 2024-01-27: qty 2

## 2024-01-27 MED ORDER — INSULIN ASPART 100 UNIT/ML IJ SOLN: 0.0000 [IU] | Freq: Every day | INTRAMUSCULAR | Status: DC

## 2024-01-27 MED ORDER — INSULIN ASPART 100 UNIT/ML IJ SOLN: 0.0000 [IU] | Freq: Three times a day (TID) | INTRAMUSCULAR | Status: DC

## 2024-01-27 MED ORDER — METOPROLOL TARTRATE 25 MG PO TABS
25.0000 mg | ORAL_TABLET | Freq: Two times a day (BID) | ORAL | Status: DC
  Administered 2024-01-27 – 2024-01-28 (×3): 25 mg via ORAL
  Filled 2024-01-27 (×3): qty 1

## 2024-01-27 MED ORDER — SERTRALINE HCL 50 MG PO TABS: 150.0000 mg | ORAL_TABLET | Freq: Every day | ORAL | Status: DC

## 2024-01-27 NOTE — Progress Notes (Signed)
 PT Cancellation Note  Patient Details Name: Christina [name] MRN: [id number] DOB: 03-Jul-1980   Cancelled Treatment:    Reason Eval/Treat Not Completed: PT screened, no needs identified, will sign off. Per OT pt independent with all mobility.   Rodgers ORN [facility] 01/27/2024, 9:32 AM Rodgers Opal PT Acute [employer] [phone]

## 2024-01-27 NOTE — Assessment & Plan Note (Signed)
 Will continue statin therapy

## 2024-01-27 NOTE — Telephone Encounter (Signed)
 Rico can you please order a 30-day heart monitor for acute CVA?  Dr. Court to read since he is DOD.

## 2024-01-27 NOTE — Progress Notes (Signed)
 Echocardiogram 2D Echocardiogram has been performed.  Juliene JINNY Rucks 01/27/2024, 2:32 PM

## 2024-01-27 NOTE — Assessment & Plan Note (Addendum)
-   Will allow permissive hypertension. - All BP medications were held off and Lopressor  was significantly decreased

## 2024-01-27 NOTE — Assessment & Plan Note (Signed)
-   The patient will be placed on supplemental coverage with NovoLog. 

## 2024-01-27 NOTE — Plan of Care (Signed)

## 2024-01-27 NOTE — Plan of Care (Signed)
" °  Problem: Self-Care: Goal: Ability to participate in self-care as condition permits will improve Outcome: Progressing   Problem: Nutrition: Goal: Risk of aspiration will decrease Outcome: Progressing   Problem: Skin Integrity: Goal: Risk for impaired skin integrity will decrease Outcome: Progressing   Problem: Clinical Measurements: Goal: Ability to maintain clinical measurements within normal limits will improve Outcome: Progressing Goal: Will remain free from infection Outcome: Progressing Goal: Diagnostic test results will improve Outcome: Progressing Goal: Respiratory complications will improve Outcome: Progressing Goal: Cardiovascular complication will be avoided Outcome: Progressing   Problem: Activity: Goal: Risk for activity intolerance will decrease Outcome: Progressing   "

## 2024-01-27 NOTE — Progress Notes (Signed)
 STROKE TEAM PROGRESS NOTE    INTERIM HISTORY/SUBJECTIVE No acute events this morning.   OBJECTIVE  CBC    Component Value Date/Time   WBC 7.4 01/27/2024 0425   RBC 4.50 01/27/2024 0425   HGB 14.7 01/27/2024 0425   HCT 43.0 01/27/2024 0425   PLT 258 01/27/2024 0425   MCV 95.6 01/27/2024 0425   MCH 32.7 01/27/2024 0425   MCHC 34.2 01/27/2024 0425   RDW 11.1 (L) 01/27/2024 0425   LYMPHSABS 2.6 01/26/2024 1258   MONOABS 0.5 01/26/2024 1258   EOSABS 0.4 01/26/2024 1258   BASOSABS 0.1 01/26/2024 1258    BMET    Component Value Date/Time   NA 138 01/27/2024 0425   K 3.4 (L) 01/27/2024 0425   CL 101 01/27/2024 0425   CO2 27 01/27/2024 0425   GLUCOSE 88 01/27/2024 0425   BUN 8 01/27/2024 0425   CREATININE 0.94 01/27/2024 0425   CALCIUM  9.1 01/27/2024 0425   GFRNONAA >60 01/27/2024 0425    IMAGING past 24 hours MR BRAIN WO CONTRAST Result Date: 01/26/2024 EXAM: MRI Brain Without Contrast 01/26/2024 05:56:00 PM TECHNIQUE: Multiplanar multisequence MRI of the head/brain was performed without the administration of intravenous contrast. COMPARISON: MRI Head 02/25/22 CLINICAL HISTORY: Neuro deficit, acute, stroke suspected FINDINGS: BRAIN AND VENTRICLES: Acute infarct in the right operculum immediately inferior to a remote frontal infarct. Edema without mass effect. 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 No intracranial hemorrhage. No mass. No midline shift. No hydrocephalus. Normal flow voids. ORBITS: No acute abnormality. SINUSES AND MASTOIDS: Mild sinus mucosal thickening. BONES AND SOFT TISSUES: Normal marrow signal. No acute soft tissue abnormality. IMPRESSION: 1. Acute infarct in the right operculum immediately inferior to a remote frontal infarct. Edema without mass effect. 2. 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 Electronically signed by: Gilmore Molt 01/26/2024 07:25 PM EST RP Workstation: [id number]   CT ANGIO HEAD NECK  W WO CM Result Date: 01/26/2024 EXAM: CTA HEAD AND NECK WITHOUT AND WITH 01/26/2024 03:33:42 PM TECHNIQUE: CTA of the head and neck was performed without and with the administration of intravenous contrast. Multiplanar 2D and/or 3D reformatted images are provided for review. Automated exposure control, iterative reconstruction, and/or weight based adjustment of the mA/kV was utilized to reduce the radiation dose to as low as reasonably achievable. Stenosis of the internal carotid arteries measured using NASCET criteria. COMPARISON: CTA head and neck 03/21/2021 CLINICAL HISTORY: Stroke/TIA, determine embolic source. FINDINGS: CTA NECK: AORTIC ARCH AND ARCH VESSELS: Normal variant aortic arch branching pattern with common origin of the brachiocephalic and left common carotid arteries. No dissection or arterial injury. No significant stenosis of the brachiocephalic or subclavian arteries. CERVICAL CAROTID ARTERIES: No dissection, arterial injury, or hemodynamically significant stenosis by NASCET criteria. CERVICAL VERTEBRAL ARTERIES: No dissection, arterial injury, or significant stenosis. LUNGS AND MEDIASTINUM: Unremarkable. SOFT TISSUES: No acute abnormality. BONES: No acute abnormality. CTA HEAD: ANTERIOR CIRCULATION: The intracranial internal carotid arteries are widely patent. Both MCAs are patent proximally without evidence of a significant M1 stenosis. There is short segment occlusion versus severe stenosis of a right mid M2 branch vessel with reconstitution of more normal flow distally (series 12 image 18), and this is new from the prior CTA but corresponds to the distribution of chronic infarcts on today's earlier noncontrast head CT. There is also a decreased number of more distal branch vessels in the right MCA posterior division. The ACAs are patent without evidence of a high grade proximal stenosis. No aneurysm. POSTERIOR  CIRCULATION: The intracranial vertebral arteries are widely patent to the basilar  artery. The basilar artery is widely patent. There are small posterior communicating arteries, left larger than right. Both PCAs are patent without evidence of a high grade proximal stenosis. No aneurysm. OTHER: No dural venous sinus thrombosis on this non-dedicated study. IMPRESSION: 1. Short segment occlusion versus severe stenosis of a mid right M2 vessel at a branch point, new from 03/21/2021 but corresponding to the distribution of chronic infarcts. 2. Decreased number of more distal branch vessels in the right MCA posterior division. 3. Widely patent cervical carotid and vertebral arteries. Electronically signed by: Dasie Hamburg MD 01/26/2024 04:29 PM EST RP Workstation: [id number]   CT HEAD WO CONTRAST Result Date: 01/26/2024 EXAM: CT HEAD WITHOUT CONTRAST 01/26/2024 02:04:00 PM TECHNIQUE: CT of the head was performed without the administration of intravenous contrast. Automated exposure control, iterative reconstruction, and/or weight based adjustment of the mA/kV was utilized to reduce the radiation dose to as low as reasonably achievable. COMPARISON: Head CT 09/25/2022 and MRI 02/25/2022. CLINICAL HISTORY: Transient ischemic attack (TIA). FINDINGS: BRAIN AND VENTRICLES: There is no evidence of an acute infarct, intracranial hemorrhage, mass, midline shift, hydrocephalus, or extra-axial fluid collection. Small chronic infarcts involving the right frontal operculum and right insula are unchanged. A partially empty sella is noted. 5 to 6 mm of cerebellar tonsillar ectopia is similar to the prior studies. ORBITS: No acute abnormality. SINUSES: Mild left greater than right ethmoid air cell opacification. Bilateral maxillary sinus mucous retention cysts. Clear mastoid air cells. SOFT TISSUES AND SKULL: No acute soft tissue abnormality. No skull fracture. IMPRESSION: 1. No acute intracranial abnormality. 2. Unchanged small chronic MCA infarcts. 3. Unchanged partially empty sella and cerebellar tonsillar  ectopia. Electronically signed by: Dasie Hamburg MD 01/26/2024 03:24 PM EST RP Workstation: [id number]    Vitals:   01/27/24 0900 01/27/24 0915 01/27/24 0928 01/27/24 1156  BP:   (!) 125/95 (!) 144/92  Pulse:    60  Resp: 11 (!) 23  14  Temp:    98.1 F (36.7 C)  TempSrc:    Oral  SpO2:    100%  Weight:      Height:        Physical exam:  General: Laying comfortably in bed; in no acute distress.  HENT: Normal oropharynx and mucosa. Normal external appearance of ears and nose.  Neck: Supple, no pain or tenderness  CV: No JVD. No peripheral edema.  Pulmonary: Symmetric Chest rise. Normal respiratory effort.  Abdomen: Soft to touch, non-tender.  Ext: No cyanosis, edema, or deformity  Skin: No rash. Normal palpation of skin.   Musculoskeletal: Normal digits and nails by inspection. No clubbing.    Neurologic Examination  Mental status/Cognition: Alert, oriented to self, place, month and year, good attention.  Speech/language: Fluent, comprehension intact, object naming intact, repetition intact.  Cranial nerves:   CN II Pupils equal and reactive to light, no VF deficits    CN III,IV,VI EOM intact, no gaze preference or deviation, no nystagmus    CN V normal sensation in V1, V2, and V3 segments bilaterally    CN VII no asymmetry, no nasolabial fold flattening    CN VIII normal hearing to speech    CN IX & X normal palatal elevation, no uvular deviation    CN XI 5/5 head turn and 5/5 shoulder shrug bilaterally    CN XII midline tongue protrusion     Motor:  Muscle bulk: normal, tone noral, pronator drift  none tremor none Mvmt Root Nerve  Muscle Right Left Comments  SA C5/6 Ax Deltoid 5 5    EF C5/6 Mc Biceps 5 5    EE C6/7/8 Rad Triceps 5 5    WF C6/7 Med FCR        WE C7/8 PIN ECU        F Ab C8/T1 U ADM/FDI 5 5    HF L1/2/3 Fem Illopsoas 5 5    KE L2/3/4 Fem Quad 5 5    DF L4/5 D Peron Tib Ant 5 5    PF S1/2 Tibial Grc/Sol 5 5      Sensation:  Light touch Intact  throughout   Pin prick     Temperature     Vibration    Proprioception      Coordination/Complex Motor:  - Finger to Nose intact BL - Heel to shin intact BL - Rapid alternating movement are normal - Gait: deferred.  ASSESSMENT/PLAN  Christina [name] is a 42 y.o. 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 She was found to have a new R frontal operculum infarct, inferior to prior R MCA stroke. 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  Since her cardioembolic etiology is low on the differential, 2D echo can be deferred for outpatient as patient wants to go home Christmas.  It is also prudent to complete 30-day Zio patch cardiac monitoring on discharge.  We will also draw hypercoagulable labs before she gets discharged, to be followed up outpatient.   Antithrombotic - Aspirin  81mg  daily along with plavix  75mg  daily x 90days, followed by Aspirin  81mg  daily alone  Hypercoagulable labs, ordered Zio patch for 4 weeks on discharge LDL 43, continue home statin regimen since LDL is well-controlled. LDL goal <70. HgbA1c 5.3, goal <7 (previously 9) BP goal less than 130/80 VTE prophylaxis -okay for chemical DVT prophy No further inpatient stroke workup indicated at this time.  Thedford Homans, MD Vascular Neurology    To contact Stroke Continuity provider, please refer to [email or web address]. After hours, contact General Neurology

## 2024-01-27 NOTE — Evaluation (Signed)
 Occupational Therapy Evaluation Patient Details Name: Christina [name] MRN: [id number] DOB: 01/07/1981 Today's Date: 01/27/2024   History of Present Illness   Pt is a 43 y/o F who presented to [facility] ED 01/26/24 for L facial droop and L sided numbness in face and arm. MRI brain revealed acute R operculum infarct, inferior to prior R MCA CVA. PMHx: prior R MCA stroke s/p tPA and thrombectomy, DMII, HTN, GERD, sleep apnea.     Clinical Impressions Pt seen in ED for OT evaluation. AOX4. PTA, lived with family in [place] home, indep with BADL, IADLs, mobility. Does not work, + driving. Pt presenting today near her baseline, with mild LUE weakness, but did not impact her functionally. Ambulated community distance independently and was indep with UB/LB ADLs. Pt declining any OP therapy services, recommend return home with PRN assist for IADLs from family. Acute OT to sign-off.     If plan is discharge home, recommend the following:   Assistance with cooking/housework (PRN assist for IADLs)     Functional Status Assessment         Equipment Recommendations   None recommended by OT     Recommendations for Other Services         Precautions/Restrictions   Precautions Precautions: Fall Recall of Precautions/Restrictions: Intact Precaution/Restrictions Comments: permissive HTN for first 24 hours Restrictions Weight Bearing Restrictions Per Provider Order: No     Mobility Bed Mobility Overal bed mobility: Independent                  Transfers Overall transfer level: Independent Equipment used: None                      Balance Overall balance assessment: Independent                                         ADL either performed or assessed with clinical judgement   ADL Overall ADL's : Independent                                             Vision Baseline Vision/History: 1 Wears glasses (glasses all the  time) Ability to See in Adequate Light: 0 Adequate Patient Visual Report: No change from baseline Vision Assessment?: No apparent visual deficits     Perception         Praxis         Pertinent Vitals/Pain Pain Assessment Pain Assessment: No/denies pain     Extremity/Trunk Assessment Upper Extremity Assessment Upper Extremity Assessment: Right hand dominant;LUE deficits/detail LUE Deficits / Details: minor strength deficits in grip strength ~4-/5 LUE Sensation: decreased light touch LUE Coordination: WNL   Lower Extremity Assessment Lower Extremity Assessment: Overall WFL for tasks assessed (pt reporting mild LLE strength deficits, but strength testing symmetrical)       Communication Communication Communication: No apparent difficulties   Cognition Arousal: Alert Behavior During Therapy: WFL for tasks assessed/performed Cognition: No apparent impairments                               Following commands: Intact       Cueing  General Comments   Cueing Techniques: Verbal cues  spouse lying on floor  sleeping throughout   Exercises     Shoulder Instructions      Home Living Family/patient expects to be discharged to:: Private residence Living Arrangements: Spouse/significant other Available Help at Discharge: Family;Available 24 hours/day (16, 20, 22 y/o children) Type of Home: House Home Access: Stairs to enter [employer] of Steps: 1 Entrance Stairs-Rails: None Home Layout: Two level;1/2 bath on main level Alternate Level Stairs-Number of Steps: FF Alternate Level Stairs-Rails: Can reach both Bathroom Shower/Tub: Chief Strategy Officer: Standard     Home Equipment: [employer] (2 wheels);Cane - single point;Rollator (4 wheels) (has bucket from [facility] but not the frame)          Prior Functioning/Environment Prior Level of Function : Independent/Modified Independent;Driving             Mobility Comments:  no AD PTA ADLs Comments: indep    OT Problem List: Decreased strength   OT Treatment/Interventions:        OT Goals(Current goals can be found in the care plan section)   Acute Rehab OT Goals Patient Stated Goal: go home   OT Frequency:       Co-evaluation              AM-PAC OT 6 Clicks Daily Activity     Outcome Measure Help from another person eating meals?: None Help from another person taking care of personal grooming?: None Help from another person toileting, which includes using toliet, bedpan, or urinal?: None Help from another person bathing (including washing, rinsing, drying)?: None Help from another person to put on and taking off regular upper body clothing?: None Help from another person to put on and taking off regular lower body clothing?: None 6 Click Score: 24   End of Session Nurse Communication: Mobility status  Activity Tolerance: Patient tolerated treatment well Patient left: in bed;with call bell/phone within reach (ED litter)  OT Visit Diagnosis: Muscle weakness (generalized) (M62.81)                Time: 9179-9153 OT Time Calculation (min): 26 min Charges:  OT General Charges $OT Visit: 1 Visit OT Evaluation $OT Eval Low Complexity: 1 Low  Dmarco Baldus M. [place], OTR/L [facility] Acute Rehabilitation Services [phone] Secure Chat Preferred  Christina [name] 01/27/2024, 8:53 AM

## 2024-01-27 NOTE — Progress Notes (Signed)
 " PROGRESS NOTE    Christina [name]  FMW:[id number] DOB: 05-Nov-1980 DOA: 01/26/2024 PCP: Gladis Dannielle DEL, PA-C   Brief Narrative:  Christina [name] is a 43 y.o. female with medical history significant for asthma, type 2 diabetes mellitus, hypertension and CVA, who presented to the emergency room with acute onset of left upper and lower extremity numbness and left leg weakness that started yesterday around 5 PM.  She denied any difficulty with her gait or other paresthesias or focal muscle weakness.  No dysarthria or dysphagia.  She denied any headache or dizziness or blurred vision.  No bleeding diathesis.  No tinnitus or vertigo.  No witnessed seizures.  No nausea or vomiting or abdominal pain.  No chest pain or palpitations.  No cough or wheezing or dyspnea.  No dysuria, oliguria or hematuria or flank pain.   ED Course: When she came to the ER, BP was 124/104 with otherwise normal vital signs.  Labs revealed AST of 47 with otherwise unremarkable CMP and CBC was normal.  Coag profile was normal.  Blood glucose was 70 and later 78.  Alcohol level was less than 15. EKG as reviewed by me : None. Imaging: Noncontrast head CT scan showed no acute intracranial normalities.  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 CTA of the head and neck with and without contrast revealed the following: 1. Short segment occlusion versus severe stenosis of a mid right M2 vessel at a branch point, new from 03/21/2021 but corresponding to the distribution of chronic infarcts. 2. Decreased number of more distal branch vessels in the right MCA posterior division. 3. Widely patent cervical carotid and vertebral arteries.. Brain MRI without contrast revealed the following: 1. Acute infarct in the right operculum immediately inferior to a remote frontal infarct. Edema without mass effect. 2. Unchanged partially empty sella and cerebellar tonsillar  ectopia.   The patient was given 0.5 mg of p.o. Ativan .  Dr. Vanessa who was notified and saw the patient in the ER.  She will be admitted to an observation telemetry bed for further evaluation and management.  Assessment & Plan:   Principal Problem:   Acute CVA (cerebrovascular accident) [facility]) Active Problems:   Dyslipidemia   Essential hypertension   Type 2 diabetes mellitus (HCC)   Depression  Acute ischemic CVA/hypertension/dyslipidemia: CT Head without contrast(Personally reviewed): CTH was negative for a large hypodensity concerning for a large territory infarct or hyperdensity concerning for an ICH   CT angio Head and Neck with contrast(Personally reviewed): 1. Short segment occlusion versus severe stenosis of a mid right M2 vessel at a branch point, new from 03/21/2021 but corresponding to the distribution of chronic infarcts. 2. Decreased number of more distal branch vessels in the right MCA posterior division. 3. Widely patent cervical carotid and vertebral arteries.   MRI Brain(Personally reviewed): 1. Acute infarct in the right operculum immediately inferior to a remote frontal infarct. Edema without mass effect. 2. 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  Patient seen by neurology.  She has now been started on DAPT, recommendation by neurology is to continue DAPT for 90 days followed by aspirin  alone.  Symptoms started Monday, almost 48 hours.  Goal is normotensive.  Blood pressure is within normal range.  Patient appears to be taking amlodipine  10 mg and hydrochlorothiazide  12.5 mg which is on hold.  She is also on Lopressor  100 mg p.o. daily however she has been resumed on 25 mg  p.o. twice daily for now.  Hemoglobin A1c 5.3.  LDL 43, she is under goal<70.  Sounds like patient was not taking Crestor .  I have resumed her on 20 mg for now.  Await further recommendations by neurology.  Type 2 diabetes mellitus: Patient's hemoglobin has been  within normal range for last several months.  No need of SSI.  Anxiety/depression: Resume Zoloft .  Hypokalemia: Will replenish.  DVT prophylaxis: enoxaparin  (LOVENOX ) injection 40 mg Start: 01/27/24 1000   Code Status: Full Code  Family Communication: Husband/boyfriend present at bedside.  Plan of care discussed with patient in length and he/she verbalized understanding and agreed with it.  Status is: Observation The patient will require care spanning > 2 midnights and should be moved to inpatient because: Needs TEE, PT OT assessment.  May require CIR placement.   Estimated body mass index is 32.42 kg/m as calculated from the following:   Height as of this encounter: 5' 7 (1.702 m).   Weight as of this encounter: 93.9 kg.    Nutritional Assessment: Body mass index is 32.42 kg/m.Christina [name] Seen by dietician.  I agree with the assessment and plan as outlined below: Nutrition Status:        . Skin Assessment: I have examined the patient's skin and I agree with the wound assessment as performed by the wound care RN as outlined below:    Consultants:  Neurology  Procedures:  None  Antimicrobials:  Anti-infectives (From admission, onward)    None         Subjective: Patient seen and examined, she says that overall her numbness is better than yesterday.  She has no other complaint.  Objective: Vitals:   01/27/24 0300 01/27/24 0500 01/27/24 0507 01/27/24 0600  BP: (!) 124/92 (!) 144/98  129/86  Pulse: 63 (!) 59  (!) 58  Resp: 20 16  18   Temp:   97.8 F (36.6 C)   TempSrc:   Oral   SpO2: 96% 100%  97%  Weight:      Height:       No intake or output data in the 24 hours ending 01/27/24 0746 Filed Weights   01/26/24 1239  Weight: 93.9 kg    Examination:  General exam: Appears calm and comfortable  Respiratory system: Clear to auscultation. Respiratory effort normal. Cardiovascular system: S1 & S2 heard, RRR. No JVD, murmurs, rubs, gallops or clicks. No pedal  edema. Gastrointestinal system: Abdomen is nondistended, soft and nontender. No organomegaly or masses felt. Normal bowel sounds heard. Central nervous system: Alert and oriented.  4.5/5 power in left upper extremity.  I did not appreciate any facial droop today. Extremities: Symmetric 5 x 5 power. Skin: No rashes, lesions or ulcers Psychiatry: Judgement and insight appear normal. Mood & affect appropriate.    Data Reviewed: I have personally reviewed following labs and imaging studies  CBC: Recent Labs  Lab 01/26/24 1258 01/26/24 1304 01/27/24 0425  WBC 5.7  --  7.4  NEUTROABS 2.2  --   --   HGB 14.0 13.9 14.7  HCT 42.4 41.0 43.0  MCV 99.5  --  95.6  PLT 286  --  258   Basic Metabolic Panel: Recent Labs  Lab 01/26/24 1258 01/26/24 1304 01/27/24 0425  NA 138 140 138  K 4.2 3.9 3.4*  CL 100 100 101  CO2 28  --  27  GLUCOSE 70 78 88  BUN 10 9 8   CREATININE 0.88 1.00 0.94  CALCIUM  9.0  --  9.1   GFR: Estimated Creatinine Clearance: 92.6 mL/min (by C-G formula based on SCr of 0.94 mg/dL). Liver Function Tests: Recent Labs  Lab 01/26/24 1258  AST 47*  ALT 28  ALKPHOS 46  BILITOT 0.5  PROT 7.0  ALBUMIN 3.9   No results for input(s): LIPASE, AMYLASE in the last 168 hours. No results for input(s): AMMONIA in the last 168 hours. Coagulation Profile: Recent Labs  Lab 01/26/24 1258  INR 1.0   Cardiac Enzymes: No results for input(s): CKTOTAL, CKMB, CKMBINDEX, TROPONINI in the last 168 hours. BNP (last 3 results) No results for input(s): PROBNP in the last 8760 hours. HbA1C: Recent Labs    01/27/24 0425  HGBA1C 5.3   CBG: Recent Labs  Lab 01/26/24 1253  GLUCAP 93   Lipid Profile: Recent Labs    01/27/24 0425  CHOL 100  HDL 46  LDLCALC 43  TRIG 52  CHOLHDL 2.2   Thyroid Function Tests: No results for input(s): TSH, T4TOTAL, FREET4, T3FREE, THYROIDAB in the last 72 hours. Anemia Panel: No results for input(s):  VITAMINB12, FOLATE, FERRITIN, TIBC, IRON, RETICCTPCT in the last 72 hours. Sepsis Labs: No results for input(s): PROCALCITON, LATICACIDVEN in the last 168 hours.  No results found for this or any previous visit (from the past 240 hours).   Radiology Studies: MR BRAIN WO CONTRAST Result Date: 01/26/2024 EXAM: MRI Brain Without Contrast 01/26/2024 05:56:00 PM TECHNIQUE: Multiplanar multisequence MRI of the head/brain was performed without the administration of intravenous contrast. COMPARISON: MRI Head 02/25/22 CLINICAL HISTORY: Neuro deficit, acute, stroke suspected FINDINGS: BRAIN AND VENTRICLES: Acute infarct in the right operculum immediately inferior to a remote frontal infarct. Edema without mass effect. 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 No intracranial hemorrhage. No mass. No midline shift. No hydrocephalus. Normal flow voids. ORBITS: No acute abnormality. SINUSES AND MASTOIDS: Mild sinus mucosal thickening. BONES AND SOFT TISSUES: Normal marrow signal. No acute soft tissue abnormality. IMPRESSION: 1. Acute infarct in the right operculum immediately inferior to a remote frontal infarct. Edema without mass effect. 2. 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 Electronically signed by: Gilmore Molt 01/26/2024 07:25 PM EST RP Workstation: [id number]   CT ANGIO HEAD NECK W WO CM Result Date: 01/26/2024 EXAM: CTA HEAD AND NECK WITHOUT AND WITH 01/26/2024 03:33:42 PM TECHNIQUE: CTA of the head and neck was performed without and with the administration of intravenous contrast. Multiplanar 2D and/or 3D reformatted images are provided for review. Automated exposure control, iterative reconstruction, and/or weight based adjustment of the mA/kV was utilized to reduce the radiation dose to as low as reasonably achievable. Stenosis of the internal carotid arteries measured using NASCET criteria. COMPARISON: CTA head and neck 03/21/2021  CLINICAL HISTORY: Stroke/TIA, determine embolic source. FINDINGS: CTA NECK: AORTIC ARCH AND ARCH VESSELS: Normal variant aortic arch branching pattern with common origin of the brachiocephalic and left common carotid arteries. No dissection or arterial injury. No significant stenosis of the brachiocephalic or subclavian arteries. CERVICAL CAROTID ARTERIES: No dissection, arterial injury, or hemodynamically significant stenosis by NASCET criteria. CERVICAL VERTEBRAL ARTERIES: No dissection, arterial injury, or significant stenosis. LUNGS AND MEDIASTINUM: Unremarkable. SOFT TISSUES: No acute abnormality. BONES: No acute abnormality. CTA HEAD: ANTERIOR CIRCULATION: The intracranial internal carotid arteries are widely patent. Both MCAs are patent proximally without evidence of a significant M1 stenosis. There is short segment occlusion versus severe stenosis of a right mid M2 branch vessel with reconstitution of more normal flow distally (series 12 image 18), and this  is new from the prior CTA but corresponds to the distribution of chronic infarcts on today's earlier noncontrast head CT. There is also a decreased number of more distal branch vessels in the right MCA posterior division. The ACAs are patent without evidence of a high grade proximal stenosis. No aneurysm. POSTERIOR CIRCULATION: The intracranial vertebral arteries are widely patent to the basilar artery. The basilar artery is widely patent. There are small posterior communicating arteries, left larger than right. Both PCAs are patent without evidence of a high grade proximal stenosis. No aneurysm. OTHER: No dural venous sinus thrombosis on this non-dedicated study. IMPRESSION: 1. Short segment occlusion versus severe stenosis of a mid right M2 vessel at a branch point, new from 03/21/2021 but corresponding to the distribution of chronic infarcts. 2. Decreased number of more distal branch vessels in the right MCA posterior division. 3. Widely patent  cervical carotid and vertebral arteries. Electronically signed by: Dasie Hamburg MD 01/26/2024 04:29 PM EST RP Workstation: [id number]   CT HEAD WO CONTRAST Result Date: 01/26/2024 EXAM: CT HEAD WITHOUT CONTRAST 01/26/2024 02:04:00 PM TECHNIQUE: CT of the head was performed without the administration of intravenous contrast. Automated exposure control, iterative reconstruction, and/or weight based adjustment of the mA/kV was utilized to reduce the radiation dose to as low as reasonably achievable. COMPARISON: Head CT 09/25/2022 and MRI 02/25/2022. CLINICAL HISTORY: Transient ischemic attack (TIA). FINDINGS: BRAIN AND VENTRICLES: There is no evidence of an acute infarct, intracranial hemorrhage, mass, midline shift, hydrocephalus, or extra-axial fluid collection. Small chronic infarcts involving the right frontal operculum and right insula are unchanged. A partially empty sella is noted. 5 to 6 mm of cerebellar tonsillar ectopia is similar to the prior studies. ORBITS: No acute abnormality. SINUSES: Mild left greater than right ethmoid air cell opacification. Bilateral maxillary sinus mucous retention cysts. Clear mastoid air cells. SOFT TISSUES AND SKULL: No acute soft tissue abnormality. No skull fracture. IMPRESSION: 1. No acute intracranial abnormality. 2. Unchanged small chronic MCA infarcts. 3. 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 Electronically signed by: Dasie Hamburg MD 01/26/2024 03:24 PM EST RP Workstation: [id number]    Scheduled Meds:   stroke: early stages of recovery book   Does not apply Once   aspirin  EC  81 mg Oral Daily   clopidogrel   75 mg Oral Daily   enoxaparin  (LOVENOX ) injection  40 mg Subcutaneous Q24H   FLUoxetine   40 mg Oral Daily   insulin  aspart  0-15 Units Subcutaneous TID WC   insulin  aspart  0-5 Units Subcutaneous QHS   loratadine   10 mg Oral Daily   metoprolol  tartrate  25 mg Oral BID   sodium chloride  flush  3 mL Intravenous Once    Continuous Infusions:  sodium chloride  75 mL/hr at 01/26/24 2359     LOS: 0 days   Fredia Skeeter, MD [facility]  01/27/2024, 7:46 AM   *Please note that this is a verbal dictation therefore any spelling or grammatical errors are due to the Dragon Medical One system interpretation.  Please page via Amion and do not message via secure chat for urgent patient care matters. Secure chat can be used for non urgent patient care matters.  How to contact the [facility] Attending or Consulting provider 7A - 7P or covering provider during after hours 7P -7A, for this patient?  Check the care team in [facility] and look for a) attending/consulting [facility] provider listed and b) the [facility] team listed. Page or secure chat 7A-7P. Log into [email or web address] and  use [place]'s universal password to access. If you do not have the password, please contact the hospital operator. Locate the [facility] provider you are looking for under [facility] and page to a number that you can be directly reached. If you still have difficulty reaching the provider, please page the [facility] (Director on Call) for the Hospitalists listed on amion for assistance.  "

## 2024-01-27 NOTE — Progress Notes (Signed)
" ° °  Brief Progress Note   _____________________________________________________________________________________________________________  Patient Name: Christina [name] Patient DOB: 1981/01/28 Date: @TODAY @      Data: Reviewed labs, notes, VS.     Action: No action needed at this time.     Response:    _____________________________________________________________________________________________________________  The [facility] RN Expeditor Sharolyn JONETTA Batman Please contact us  directly via secure chat (search for [place]) or by calling us  at [phone] [facility]).  "

## 2024-01-27 NOTE — Assessment & Plan Note (Addendum)
-   Brain MRI without contrast revealed the following: 1. Acute infarct in the right operculum immediately inferior to a remote frontal infarct. Edema without mass effect. 2. 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 - The patient will be admitted to an observation medically monitored bed.   - We will follow neuro checks q.4 hours for 24 hours.   - The patient will be placed on aspirin . - Permissive hypertension will be allowed. - Will obtain a 2D echo with bubble study .   - A neurology consult was obtained.Physical/occupation/speech therapy consults will be obtained in a.m.Christina [name]   - The patient will be placed on statin therapy and fasting lipids will be checked.

## 2024-01-27 NOTE — Plan of Care (Signed)
" °  Problem: Education: Goal: Knowledge of disease or condition will improve Outcome: Progressing Goal: Knowledge of secondary prevention will improve (MUST DOCUMENT ALL) Outcome: Progressing Goal: Knowledge of patient specific risk factors will improve (DELETE if not current risk factor) Outcome: Progressing   Problem: Ischemic Stroke/TIA Tissue Perfusion: Goal: Complications of ischemic stroke/TIA will be minimized Outcome: Progressing   Problem: Self-Care: Goal: Ability to participate in self-care as condition permits will improve Outcome: Progressing   Problem: Nutrition: Goal: Risk of aspiration will decrease Outcome: Progressing   Problem: Activity: Goal: Risk for activity intolerance will decrease Outcome: Progressing   "

## 2024-01-27 NOTE — Assessment & Plan Note (Signed)
Will continue Zoloft. 

## 2024-01-28 DIAGNOSIS — I639 Cerebral infarction, unspecified: Secondary | ICD-10-CM | POA: Diagnosis not present

## 2024-01-28 LAB — LUPUS ANTICOAGULANT PANEL
DRVVT: 30 s (ref 0.0–47.0)
PTT Lupus Anticoagulant: 36.5 s (ref 0.0–43.5)

## 2024-01-28 LAB — PROTEIN C ACTIVITY: Protein C Activity: 80 % (ref 73–180)

## 2024-01-28 LAB — BASIC METABOLIC PANEL WITH GFR
Anion gap: 7 (ref 5–15)
BUN: 13 mg/dL (ref 6–20)
CO2: 29 mmol/L (ref 22–32)
Calcium: 9 mg/dL (ref 8.9–10.3)
Chloride: 103 mmol/L (ref 98–111)
Creatinine, Ser: 0.83 mg/dL (ref 0.44–1.00)
GFR, Estimated: >60 mL/min
Glucose, Bld: 96 mg/dL (ref 70–99)
Potassium: 3.8 mmol/L (ref 3.5–5.1)
Sodium: 140 mmol/L (ref 135–145)

## 2024-01-28 LAB — GLUCOSE, CAPILLARY: Glucose-Capillary: 98 mg/dL (ref 70–99)

## 2024-01-28 LAB — PROTEIN S, TOTAL: Protein S Ag, Total: 86 % (ref 60–150)

## 2024-01-28 LAB — PROTEIN S ACTIVITY: Protein S Activity: 106 % (ref 63–140)

## 2024-01-28 MED ORDER — ASPIRIN 81 MG PO TBEC: 81.0000 mg | DELAYED_RELEASE_TABLET | Freq: Every day | ORAL | 0 refills | Status: AC

## 2024-01-28 NOTE — Discharge Summary (Signed)
 Physician Discharge Summary  Orel Hord FMW:[id number] DOB: Jun 24, 1980 DOA: 01/26/2024  PCP: Gladis Dannielle DEL, PA-C  Admit date: 01/26/2024 Discharge date: 01/28/2024    Admitted From: Home Disposition: Home  Recommendations for Outpatient Follow-up:  Follow up with PCP in 1-2 weeks Please obtain BMP/CBC in one week Follow-up with neurology in 4 weeks Please follow up with your PCP on the following pending results: Unresulted Labs (From admission, onward)     Start     Ordered   01/28/24 0819  Basic metabolic panel  Once,   R       Question:  Specimen collection method  Answer:  Lab=Lab collect   01/28/24 0818   01/27/24 1311  Protein C activity  (Hypercoagulable Panel, Comprehensive (PNL))  ONCE - URGENT,   URGENT        01/27/24 1311   01/27/24 1311  Protein C, total  (Hypercoagulable Panel, Comprehensive (PNL))  Once,   R        01/27/24 1311   01/27/24 1311  Protein S activity  (Hypercoagulable Panel, Comprehensive (PNL))  Once,   R        01/27/24 1311   01/27/24 1311  Protein S, total  (Hypercoagulable Panel, Comprehensive (PNL))  Once,   R        01/27/24 1311   01/27/24 1311  Lupus anticoagulant panel  (Hypercoagulable Panel, Comprehensive (PNL))  Once,   R        01/27/24 1311   01/27/24 1311  Beta-2 -glycoprotein i abs, IgG/M/A  (Hypercoagulable Panel, Comprehensive (PNL))  Once,   R        01/27/24 1311   01/27/24 1311  Homocysteine, serum  (Hypercoagulable Panel, Comprehensive (PNL))  Once,   R        01/27/24 1311   01/27/24 1311  Factor 5 leiden  (Hypercoagulable Panel, Comprehensive (PNL))  Once,   R        01/27/24 1311   01/27/24 1311  Prothrombin gene mutation  (Hypercoagulable Panel, Comprehensive (PNL))  Once,   R        01/27/24 1311   01/27/24 1311  Cardiolipin antibodies, IgG, IgM, IgA  (Hypercoagulable Panel, Comprehensive (PNL))  Once,   R        01/27/24 1311              Home Health: None Equipment/Devices: None none  Discharge  Condition: Stable CODE STATUS: Full code Diet recommendation:  Diet Order             Diet Carb Modified Room service appropriate? Yes  Diet effective now                   Subjective: Patient seen and examined, she has no complaints at all.  Weakness that she came in with has resolved.  She is eager to go home due to Christmas.  Brief/Interim Summary: Christina [name] is a 43 y.o. female with medical history significant for asthma, type 2 diabetes mellitus, hypertension and CVA, who presented to the emergency room with acute onset of left upper and lower extremity numbness and left leg weakness that started yesterday around 5 PM.  She denied any difficulty with her gait or other paresthesias or focal muscle weakness.  No dysarthria or dysphagia.  She denied any headache or dizziness or blurred vision.  No bleeding diathesis.  No tinnitus or vertigo.  No witnessed seizures.  No nausea or vomiting or abdominal pain.  No chest pain or palpitations.  No cough or wheezing or dyspnea.  No dysuria, oliguria or hematuria or flank pain.   ED Course: When she came to the ER, BP was 124/104 with otherwise normal vital signs.  Labs revealed AST of 47 with otherwise unremarkable CMP and CBC was normal.  Coag profile was normal.  Blood glucose was 70 and later 78.  Alcohol level was less than 15. Details of hospitalization below.   Acute ischemic CVA/hypertension/dyslipidemia: CT Head without contrast(Personally reviewed): CTH was negative for a large hypodensity concerning for a large territory infarct or hyperdensity concerning for an ICH   CT angio Head and Neck with contrast(Personally reviewed): 1. Short segment occlusion versus severe stenosis of a mid right M2 vessel at a branch point, new from 03/21/2021 but corresponding to the distribution of chronic infarcts. 2. Decreased number of more distal branch vessels in the right MCA posterior division. 3. Widely patent cervical carotid and  vertebral arteries.   MRI Brain(Personally reviewed): 1. Acute infarct in the right operculum immediately inferior to a remote frontal infarct. Edema without mass effect. 2. 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   Patient seen by neurology.  She was taking Plavix  before, now she has been started on aspirin  as well, neurology has recommended to continue DAPT for 90 days followed by Plavix . Hemoglobin A1c 5.3.  LDL 43, she is under goal<70.  Sounds like patient was not taking Crestor .  I have resumed her on 20 mg for now.  Neurology recommended 30-day heart monitor for which cardiology has been involved and they will mail the heart monitor to her home.  Neurology has cleared her for discharge.  She was seen by PT OT and no needs were identified and she is independent.  Blood pressure is rising, resuming PTA antihypertensives.   Type 2 diabetes mellitus: Patient's hemoglobin A1c has been within normal range for last several months.    Anxiety/depression: Resume Zoloft .   Hypokalemia: Replenished yesterday and resolved.  Discharge plan was discussed with patient and/or family member and they verbalized understanding and agreed with it.  Discharge Diagnoses:  Principal Problem:   Acute CVA (cerebrovascular accident) [facility]) Active Problems:   Dyslipidemia   Essential hypertension   Type 2 diabetes mellitus (HCC)   Depression    Discharge Instructions   Allergies as of 01/28/2024       Reactions   Peanut (diagnostic) Anaphylaxis, Shortness Of Breath, Swelling   Zestril  [lisinopril ] Swelling   Angioedema    Penicillins Rash   Tomato Rash   Reaction occurs to raw tomatoes        Medication List     TAKE these medications    acetaminophen  500 MG tablet Commonly known as: TYLENOL  Take 1,000 mg by mouth 2 (two) times daily as needed for headache or fever (pain).   albuterol  108 (90 Base) MCG/ACT inhaler Commonly known as: VENTOLIN  HFA Inhale 2  puffs into the lungs every 6 (six) hours as needed for wheezing.   amLODipine  5 MG tablet Commonly known as: NORVASC  Take 5 mg by mouth daily.   aspirin  EC 81 MG tablet Take 1 tablet (81 mg total) by mouth daily. Swallow whole.   clopidogrel  75 MG tablet Commonly known as: PLAVIX  TAKE 1 TABLET BY MOUTH EVERY DAY   ergocalciferol 1.25 MG (50000 UT) capsule Commonly known as: VITAMIN D2 Take 50,000 Units by mouth every Monday.   FLUoxetine  40 MG capsule Commonly known as: PROZAC  Take 40 mg by mouth daily.   hydrochlorothiazide   12.5 MG capsule Commonly known as: MICROZIDE  Take 12.5 mg by mouth daily.   loratadine  10 MG tablet Commonly known as: CLARITIN  Take 10 mg by mouth daily.   metoprolol  tartrate 100 MG tablet Commonly known as: LOPRESSOR  Take 100 mg by mouth daily.   ondansetron  4 MG disintegrating tablet Commonly known as: ZOFRAN -ODT Take 1 tablet (4 mg total) by mouth every 6 (six) hours as needed for nausea or vomiting.   Ozempic (1 MG/DOSE) 4 MG/3ML Sopn Generic drug: Semaglutide (1 MG/DOSE) Inject 1 mg into the skin every Friday.   rosuvastatin  20 MG tablet Commonly known as: CRESTOR  Take 1 tablet (20 mg total) by mouth daily.        Follow-up Information     Gladis Lin H, PA-C Follow up in 1 week(s).   Contact information: [address] [place] [address] [phone]         [facility] Follow up in 1 month(s).   Contact information: [address]     [address] [place]  [address] [phone]               Allergies[1]  Consultations: Neurology   Procedures/Studies: ECHOCARDIOGRAM COMPLETE Result Date: 01/27/2024    ECHOCARDIOGRAM REPORT   Patient Name:   GILBERTA PEETERS Date of Exam: 01/27/2024 Medical Rec #:  [id number]       Height:       67.0 in Accession #:    [id number]      Weight:       207.0 lb Date of Birth:  12/05/1980        BSA:          2.052 m Patient Age:    43 years         BP:           144/92 mmHg Patient Gender: F               HR:           62 bpm. Exam Location:  Inpatient Procedure: 2D Echo, Cardiac Doppler and Color Doppler (Both Spectral and Color            Flow Doppler were utilized during procedure). Indications:    Stroke  History:        Patient has prior history of Echocardiogram examinations, most                 recent 03/22/2021. Stroke, Signs/Symptoms:Dyspnea; Risk                 Factors:Diabetes, Hypertension, Dyslipidemia, Former Smoker and                 Sleep Apnea.  Sonographer:    Juliene Rucks Referring Phys: [id number] Claude Swendsen  Sonographer Comments: Patient is obese. IMPRESSIONS  1. Left ventricular ejection fraction, by estimation, is 55 to 60%. The left ventricle has normal function. The left ventricle has no regional wall motion abnormalities. Left ventricular diastolic parameters were normal.  2. Right ventricular systolic function is normal. The right ventricular size is normal.  3. The mitral valve is normal in structure. No evidence of mitral valve regurgitation. No evidence of mitral stenosis.  4. The aortic valve is normal in structure. Aortic valve regurgitation is not visualized. No aortic stenosis is present.  5. The inferior vena cava is normal in size with greater than 50% respiratory variability, suggesting right atrial pressure of 3 mmHg. FINDINGS  Left Ventricle: Left ventricular ejection fraction,  by estimation, is 55 to 60%. The left ventricle has normal function. The left ventricle has no regional wall motion abnormalities. The left ventricular internal cavity size was normal in size. There is  no left ventricular hypertrophy. Left ventricular diastolic parameters were normal. Right Ventricle: The right ventricular size is normal. No increase in right ventricular wall thickness. Right ventricular systolic function is normal. Left Atrium: Left atrial size was normal in size. Right Atrium: Right atrial size was normal in size.  Pericardium: There is no evidence of pericardial effusion. Mitral Valve: The mitral valve is normal in structure. No evidence of mitral valve regurgitation. No evidence of mitral valve stenosis. Tricuspid Valve: The tricuspid valve is normal in structure. Tricuspid valve regurgitation is trivial. No evidence of tricuspid stenosis. Aortic Valve: The aortic valve is normal in structure. Aortic valve regurgitation is not visualized. No aortic stenosis is present. Aortic valve mean gradient measures 3.0 mmHg. Aortic valve peak gradient measures 4.8 mmHg. Aortic valve area, by VTI measures 2.65 cm. Pulmonic Valve: The pulmonic valve was normal in structure. Pulmonic valve regurgitation is not visualized. No evidence of pulmonic stenosis. Aorta: The aortic root is normal in size and structure. Venous: The inferior vena cava is normal in size with greater than 50% respiratory variability, suggesting right atrial pressure of 3 mmHg. IAS/Shunts: No atrial level shunt detected by color flow Doppler.  LEFT VENTRICLE PLAX 2D LVIDd:         4.60 cm   Diastology LVIDs:         3.50 cm   LV e' medial:    8.16 cm/s LV PW:         1.00 cm   LV E/e' medial:  9.7 LV IVS:        0.90 cm   LV e' lateral:   9.90 cm/s LVOT diam:     2.00 cm   LV E/e' lateral: 8.0 LV SV:         61 LV SV Index:   30 LVOT Area:     3.14 cm  RIGHT VENTRICLE             IVC RV Basal diam:  3.00 cm     IVC diam: 1.50 cm RV Mid diam:    2.30 cm RV S prime:     12.30 cm/s TAPSE (M-mode): 1.4 cm LEFT ATRIUM             Index        RIGHT ATRIUM           Index LA diam:        2.70 cm 1.32 cm/m   RA Area:     15.00 cm LA Vol (A2C):   29.6 ml 14.42 ml/m  RA Volume:   36.80 ml  17.93 ml/m LA Vol (A4C):   29.2 ml 14.23 ml/m LA Biplane Vol: 32.1 ml 15.64 ml/m  AORTIC VALVE AV Area (Vmax):    2.77 cm AV Area (Vmean):   2.66 cm AV Area (VTI):     2.65 cm AV Vmax:           110.00 cm/s AV Vmean:          72.400 cm/s AV VTI:            0.229 m AV Peak Grad:       4.8 mmHg AV Mean Grad:      3.0 mmHg LVOT Vmax:         96.90 cm/s  LVOT Vmean:        61.300 cm/s LVOT VTI:          0.193 m LVOT/AV VTI ratio: 0.84  AORTA Ao Root diam: 2.90 cm MITRAL VALVE MV Area (PHT): 3.17 cm    SHUNTS MV Decel Time: 239 msec    Systemic VTI:  0.19 m MV E velocity: 79.10 cm/s  Systemic Diam: 2.00 cm MV A velocity: 68.10 cm/s MV E/A ratio:  1.16 Toribio Fuel MD Electronically signed by Toribio Fuel MD Signature Date/Time: 01/27/2024/4:39:28 PM    Final    MR BRAIN WO CONTRAST Result Date: 01/26/2024 EXAM: MRI Brain Without Contrast 01/26/2024 05:56:00 PM TECHNIQUE: Multiplanar multisequence MRI of the head/brain was performed without the administration of intravenous contrast. COMPARISON: MRI Head 02/25/22 CLINICAL HISTORY: Neuro deficit, acute, stroke suspected FINDINGS: BRAIN AND VENTRICLES: Acute infarct in the right operculum immediately inferior to a remote frontal infarct. Edema without mass effect. 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 No intracranial hemorrhage. No mass. No midline shift. No hydrocephalus. Normal flow voids. ORBITS: No acute abnormality. SINUSES AND MASTOIDS: Mild sinus mucosal thickening. BONES AND SOFT TISSUES: Normal marrow signal. No acute soft tissue abnormality. IMPRESSION: 1. Acute infarct in the right operculum immediately inferior to a remote frontal infarct. Edema without mass effect. 2. 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 Electronically signed by: Gilmore Molt 01/26/2024 07:25 PM EST RP Workstation: [id number]   CT ANGIO HEAD NECK W WO CM Result Date: 01/26/2024 EXAM: CTA HEAD AND NECK WITHOUT AND WITH 01/26/2024 03:33:42 PM TECHNIQUE: CTA of the head and neck was performed without and with the administration of intravenous contrast. Multiplanar 2D and/or 3D reformatted images are provided for review. Automated exposure control, iterative reconstruction, and/or weight based  adjustment of the mA/kV was utilized to reduce the radiation dose to as low as reasonably achievable. Stenosis of the internal carotid arteries measured using NASCET criteria. COMPARISON: CTA head and neck 03/21/2021 CLINICAL HISTORY: Stroke/TIA, determine embolic source. FINDINGS: CTA NECK: AORTIC ARCH AND ARCH VESSELS: Normal variant aortic arch branching pattern with common origin of the brachiocephalic and left common carotid arteries. No dissection or arterial injury. No significant stenosis of the brachiocephalic or subclavian arteries. CERVICAL CAROTID ARTERIES: No dissection, arterial injury, or hemodynamically significant stenosis by NASCET criteria. CERVICAL VERTEBRAL ARTERIES: No dissection, arterial injury, or significant stenosis. LUNGS AND MEDIASTINUM: Unremarkable. SOFT TISSUES: No acute abnormality. BONES: No acute abnormality. CTA HEAD: ANTERIOR CIRCULATION: The intracranial internal carotid arteries are widely patent. Both MCAs are patent proximally without evidence of a significant M1 stenosis. There is short segment occlusion versus severe stenosis of a right mid M2 branch vessel with reconstitution of more normal flow distally (series 12 image 18), and this is new from the prior CTA but corresponds to the distribution of chronic infarcts on today's earlier noncontrast head CT. There is also a decreased number of more distal branch vessels in the right MCA posterior division. The ACAs are patent without evidence of a high grade proximal stenosis. No aneurysm. POSTERIOR CIRCULATION: The intracranial vertebral arteries are widely patent to the basilar artery. The basilar artery is widely patent. There are small posterior communicating arteries, left larger than right. Both PCAs are patent without evidence of a high grade proximal stenosis. No aneurysm. OTHER: No dural venous sinus thrombosis on this non-dedicated study. IMPRESSION: 1. Short segment occlusion versus severe stenosis of a mid right M2  vessel at a branch point, new from 03/21/2021 but corresponding to  the distribution of chronic infarcts. 2. Decreased number of more distal branch vessels in the right MCA posterior division. 3. Widely patent cervical carotid and vertebral arteries. Electronically signed by: Dasie Hamburg MD 01/26/2024 04:29 PM EST RP Workstation: [id number]   CT HEAD WO CONTRAST Result Date: 01/26/2024 EXAM: CT HEAD WITHOUT CONTRAST 01/26/2024 02:04:00 PM TECHNIQUE: CT of the head was performed without the administration of intravenous contrast. Automated exposure control, iterative reconstruction, and/or weight based adjustment of the mA/kV was utilized to reduce the radiation dose to as low as reasonably achievable. COMPARISON: Head CT 09/25/2022 and MRI 02/25/2022. CLINICAL HISTORY: Transient ischemic attack (TIA). FINDINGS: BRAIN AND VENTRICLES: There is no evidence of an acute infarct, intracranial hemorrhage, mass, midline shift, hydrocephalus, or extra-axial fluid collection. Small chronic infarcts involving the right frontal operculum and right insula are unchanged. A partially empty sella is noted. 5 to 6 mm of cerebellar tonsillar ectopia is similar to the prior studies. ORBITS: No acute abnormality. SINUSES: Mild left greater than right ethmoid air cell opacification. Bilateral maxillary sinus mucous retention cysts. Clear mastoid air cells. SOFT TISSUES AND SKULL: No acute soft tissue abnormality. No skull fracture. IMPRESSION: 1. No acute intracranial abnormality. 2. Unchanged small chronic MCA infarcts. 3. 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 Electronically signed by: Dasie Hamburg MD 01/26/2024 03:24 PM EST RP Workstation: [id number]     Discharge Exam: Vitals:   01/28/24 0340 01/28/24 0819  BP: (!) 135/99 (!) 144/114  Pulse: 63 69  Resp: 16 16  Temp: 98.4 F (36.9 C) 98.4 F (36.9 C)  SpO2: 96% 99%   Vitals:   01/27/24 2241 01/27/24 2340 01/28/24 0340 01/28/24 0819  BP:  129/89 (!) 137/110 (!) 135/99 (!) 144/114  Pulse:  60 63 69  Resp:  18 16 16   Temp:  98.1 F (36.7 C) 98.4 F (36.9 C) 98.4 F (36.9 C)  TempSrc:  Oral Oral   SpO2:  97% 96% 99%  Weight:      Height:        General: Pt is alert, awake, not in acute distress Cardiovascular: RRR, S1/S2 +, no rubs, no gallops Respiratory: CTA bilaterally, no wheezing, no rhonchi Abdominal: Soft, NT, ND, bowel sounds + Extremities: no edema, no cyanosis    The results of significant diagnostics from this hospitalization (including imaging, microbiology, ancillary and laboratory) are listed below for reference.     Microbiology: No results found for this or any previous visit (from the past 240 hours).   Labs: BNP (last 3 results) No results for input(s): BNP in the last 8760 hours. Basic Metabolic Panel: Recent Labs  Lab 01/26/24 1258 01/26/24 1304 01/27/24 0425  NA 138 140 138  K 4.2 3.9 3.4*  CL 100 100 101  CO2 28  --  27  GLUCOSE 70 78 88  BUN 10 9 8   CREATININE 0.88 1.00 0.94  CALCIUM  9.0  --  9.1   Liver Function Tests: Recent Labs  Lab 01/26/24 1258  AST 47*  ALT 28  ALKPHOS 46  BILITOT 0.5  PROT 7.0  ALBUMIN 3.9   No results for input(s): LIPASE, AMYLASE in the last 168 hours. No results for input(s): AMMONIA in the last 168 hours. CBC: Recent Labs  Lab 01/26/24 1258 01/26/24 1304 01/27/24 0425  WBC 5.7  --  7.4  NEUTROABS 2.2  --   --   HGB 14.0 13.9 14.7  HCT 42.4 41.0 43.0  MCV 99.5  --  95.6  PLT  286  --  258   Cardiac Enzymes: No results for input(s): CKTOTAL, CKMB, CKMBINDEX, TROPONINI in the last 168 hours. BNP: Invalid input(s): POCBNP CBG: Recent Labs  Lab 01/26/24 1253 01/27/24 0807 01/27/24 1546 01/27/24 2157 01/28/24 0602  GLUCAP 93 92 118* 96 98   D-Dimer No results for input(s): DDIMER in the last 72 hours. Hgb A1c Recent Labs    01/27/24 0425  HGBA1C 5.3   Lipid Profile Recent Labs    01/27/24 0425   CHOL 100  HDL 46  LDLCALC 43  TRIG 52  CHOLHDL 2.2   Thyroid function studies No results for input(s): TSH, T4TOTAL, T3FREE, THYROIDAB in the last 72 hours.  Invalid input(s): FREET3 Anemia work up No results for input(s): VITAMINB12, FOLATE, FERRITIN, TIBC, IRON, RETICCTPCT in the last 72 hours. Urinalysis    Component Value Date/Time   COLORURINE YELLOW 04/12/2023 0834   APPEARANCEUR HAZY (A) 04/12/2023 0834   LABSPEC 1.015 04/12/2023 0834   PHURINE 8.0 04/12/2023 0834   GLUCOSEU NEGATIVE 04/12/2023 0834   HGBUR NEGATIVE 04/12/2023 0834   BILIRUBINUR NEGATIVE 04/12/2023 0834   KETONESUR NEGATIVE 04/12/2023 0834   PROTEINUR 100 (A) 04/12/2023 0834   NITRITE NEGATIVE 04/12/2023 0834   LEUKOCYTESUR NEGATIVE 04/12/2023 0834   Sepsis Labs Recent Labs  Lab 01/26/24 1258 01/27/24 0425  WBC 5.7 7.4   Microbiology No results found for this or any previous visit (from the past 240 hours).  FURTHER DISCHARGE INSTRUCTIONS:   Get Medicines reviewed and adjusted: Please take all your medications with you for your next visit with your Primary MD   Laboratory/radiological data: Please request your Primary MD to go over all hospital tests and procedure/radiological results at the follow up, please ask your Primary MD to get all Hospital records sent to his/her office.   In some cases, they will be blood work, cultures and biopsy results pending at the time of your discharge. Please request that your primary care M.D. goes through all the records of your hospital data and follows up on these results.   Also Note the following: If you experience worsening of your admission symptoms, develop shortness of breath, life threatening emergency, suicidal or homicidal thoughts you must seek medical attention immediately by calling 911 or calling your MD immediately  if symptoms less severe.   You must read complete instructions/literature along with all the possible  adverse reactions/side effects for all the Medicines you take and that have been prescribed to you. Take any new Medicines after you have completely understood and accpet all the possible adverse reactions/side effects.    patient was instructed, not to drive, operate heavy machinery, perform activities at heights, swimming or participation in water activities or provide baby-sitting services while on Pain, Sleep and Anxiety Medications; until their outpatient Physician has advised to do so again. Also recommended to not to take more than prescribed Pain, Sleep and Anxiety Medications.  It is not advisable to combine anxiety, sleep and pain medications without talking with your primary care provider.     Wear Seat belts while driving.   Please note: You were cared for by a hospitalist during your hospital stay. Once you are discharged, your primary care physician will handle any further medical issues. Please note that NO REFILLS for any discharge medications will be authorized once you are discharged, as it is imperative that you return to your primary care physician (or establish a relationship with a primary care physician if you do not have one)  for your post hospital discharge needs so that they can reassess your need for medications and monitor your lab values  Time coordinating discharge: Over 30 minutes  SIGNED:   Fredia Skeeter, MD  [facility] 01/28/2024, 10:03 AM *Please note that this is a verbal dictation therefore any spelling or grammatical errors are due to the Dragon Medical One system interpretation. If 7PM-7AM, please contact night-coverage [email or web address]     [1]  Allergies Allergen Reactions   Peanut (Diagnostic) Anaphylaxis, Shortness Of Breath and Swelling   Zestril  [Lisinopril ] Swelling    Angioedema    Penicillins Rash   Tomato Rash    Reaction occurs to raw tomatoes

## 2024-01-28 NOTE — TOC Transition Note (Signed)
 Transition of Care [facility]) - Discharge Note   Patient Details  Name: Christina [name] MRN: [id number] Date of Birth: [birth year]  Transition of [facility] CM/SW Contact:  Tom-Johnson, Harvest Muskrat, RN Phone Number: 01/28/2024, 9:17 AM   Clinical Narrative:     Patient is scheduled for discharge today.  Outpatient f/u, hospital f/u and discharge instructions on AVS. No ICM needs or recommendations noted. Husband, Ternell to transport at discharge.  No further ICM needs noted.       Final next level of care: Home/Self Care Barriers to Discharge: Barriers Resolved   Patient Goals and CMS Choice Patient states their goals for this hospitalization and ongoing recovery are:: To return home CMS Medicare.gov Compare Post Acute Care list provided to:: Patient Choice offered to / list presented to : NA      Discharge Placement                Patient to be transferred to facility by: Husband Name of family member notified: Ternell    Discharge Plan and Services Additional resources added to the After Visit Summary for                  DME Arranged: N/A DME Agency: NA       HH Arranged: NA HH Agency: NA        Social Drivers of Health (SDOH) Interventions SDOH Screenings   Food Insecurity: No Food Insecurity (01/27/2024)  Recent Concern: Food Insecurity - Medium Risk (01/15/2024)   Received from [facility]  Housing: Low Risk (01/27/2024)  Transportation Needs: No Transportation Needs (01/27/2024)  Utilities: Not At Risk (01/27/2024)  Tobacco Use: Medium Risk (01/26/2024)     Readmission Risk Interventions     No data to display

## 2024-01-28 NOTE — Progress Notes (Signed)
 Patient alert and oriented , no complaints. Verbalized understanding of dc instructions.

## 2024-01-29 LAB — HOMOCYSTEINE: Homocysteine: 8.8 umol/L (ref 0.0–14.5)

## 2024-01-29 LAB — PROTEIN C, TOTAL: Protein C, Total: 63 % (ref 60–150)

## 2024-01-29 LAB — BETA-2-GLYCOPROTEIN I ABS, IGG/M/A
Beta-2 Glyco I IgG: <9 GPI IgG units (ref 0–20)
Beta-2-Glycoprotein I IgA: <9 GPI IgA units (ref 0–25)
Beta-2-Glycoprotein I IgM: <9 GPI IgM units (ref 0–32)

## 2024-01-30 LAB — CARDIOLIPIN ANTIBODIES, IGG, IGM, IGA
Anticardiolipin IgA: <9 U/mL (ref 0–11)
Anticardiolipin IgG: <9 GPL U/mL (ref 0–14)
Anticardiolipin IgM: <9 [MPL'U]/mL (ref 0–12)

## 2024-02-02 LAB — FACTOR 5 LEIDEN

## 2024-02-03 LAB — PROTHROMBIN GENE MUTATION

## 2024-02-12 ENCOUNTER — Telehealth: Payer: Self-pay | Admitting: Cardiovascular Disease

## 2024-02-12 NOTE — Telephone Encounter (Signed)
 Pt states heart monitor patch left her with a bad rash. Please advise. She states she only had the monitor on for three days.

## 2024-02-12 NOTE — Telephone Encounter (Signed)
 Pt states that she had to re apply the monitor after three days of wearing because the patch peeled off. She states she didn't charge it at that time. She placed it on again and it was irritating her skin, but she had to take it off because it was dead. Pt states she is currently not wearing the monitor and the area has gotten better. Informed patient of [employer] number to call with any issues and informed patient she can call them to see if she can wear it the alternative way. Pt states she will try with the patches again first and if she has any issues she will do that. She denies any known allergies to adhesive.

## 2024-03-09 ENCOUNTER — Ambulatory Visit

## 2024-03-09 DIAGNOSIS — I639 Cerebral infarction, unspecified: Secondary | ICD-10-CM

## 2024-04-04 ENCOUNTER — Ambulatory Visit: Admitting: Neurology
